# Patient Record
Sex: Female | Born: 1935 | Race: White | Hispanic: No | Marital: Married | Smoking: Never smoker
Health system: Southern US, Community
[De-identification: ages and names within clinical notes are randomized; demographics above are authoritative.]

## PROBLEM LIST (undated history)

## (undated) DIAGNOSIS — M545 Low back pain, unspecified: Secondary | ICD-10-CM

## (undated) DIAGNOSIS — K219 Gastro-esophageal reflux disease without esophagitis: Secondary | ICD-10-CM

## (undated) DIAGNOSIS — K5790 Diverticulosis of intestine, part unspecified, without perforation or abscess without bleeding: Secondary | ICD-10-CM

## (undated) DIAGNOSIS — E669 Obesity, unspecified: Secondary | ICD-10-CM

## (undated) DIAGNOSIS — N189 Chronic kidney disease, unspecified: Secondary | ICD-10-CM

## (undated) DIAGNOSIS — R0609 Other forms of dyspnea: Secondary | ICD-10-CM

## (undated) DIAGNOSIS — N63 Unspecified lump in unspecified breast: Secondary | ICD-10-CM

## (undated) DIAGNOSIS — I480 Paroxysmal atrial fibrillation: Secondary | ICD-10-CM

## (undated) DIAGNOSIS — E785 Hyperlipidemia, unspecified: Secondary | ICD-10-CM

## (undated) DIAGNOSIS — G8929 Other chronic pain: Secondary | ICD-10-CM

## (undated) DIAGNOSIS — G4730 Sleep apnea, unspecified: Secondary | ICD-10-CM

## (undated) DIAGNOSIS — I10 Essential (primary) hypertension: Secondary | ICD-10-CM

## (undated) DIAGNOSIS — R55 Syncope and collapse: Secondary | ICD-10-CM

## (undated) HISTORY — DX: Gastro-esophageal reflux disease without esophagitis: K21.9

## (undated) HISTORY — PX: ORTHOPEDIC SURGERY: SHX850

## (undated) HISTORY — DX: Chronic kidney disease, unspecified: N18.9

## (undated) HISTORY — DX: Hyperlipidemia, unspecified: E78.5

## (undated) HISTORY — DX: Low back pain: M54.5

## (undated) HISTORY — DX: Syncope and collapse: R55

## (undated) HISTORY — DX: Obesity, unspecified: E66.9

## (undated) HISTORY — DX: Paroxysmal atrial fibrillation: I48.0

## (undated) HISTORY — PX: CATARACT EXTRACTION: SUR2

## (undated) HISTORY — DX: Unspecified lump in unspecified breast: N63.0

## (undated) HISTORY — DX: Other forms of dyspnea: R06.09

## (undated) HISTORY — DX: Low back pain, unspecified: M54.50

## (undated) HISTORY — DX: Diverticulosis of intestine, part unspecified, without perforation or abscess without bleeding: K57.90

## (undated) HISTORY — PX: TUBAL LIGATION: SHX77

## (undated) HISTORY — DX: Other chronic pain: G89.29

## (undated) HISTORY — DX: Sleep apnea, unspecified: G47.30

## (undated) HISTORY — DX: Essential (primary) hypertension: I10

---

## 1984-07-16 HISTORY — PX: DILATION AND CURETTAGE OF UTERUS: SHX78

## 1990-07-16 HISTORY — PX: LUMBAR LAMINECTOMY: SHX95

## 1991-07-17 HISTORY — PX: CHOLECYSTECTOMY: SHX55

## 1997-12-23 ENCOUNTER — Inpatient Hospital Stay (HOSPITAL_COMMUNITY): Admission: EM | Admit: 1997-12-23 | Discharge: 1997-12-29 | Payer: Self-pay | Admitting: Emergency Medicine

## 1997-12-29 ENCOUNTER — Inpatient Hospital Stay
Admission: RE | Admit: 1997-12-29 | Discharge: 1998-01-17 | Payer: Self-pay | Admitting: Physical Medicine & Rehabilitation

## 1998-01-19 ENCOUNTER — Other Ambulatory Visit
Admission: RE | Admit: 1998-01-19 | Discharge: 1998-01-19 | Payer: Self-pay | Admitting: Physical Medicine & Rehabilitation

## 1998-01-27 ENCOUNTER — Other Ambulatory Visit
Admission: RE | Admit: 1998-01-27 | Discharge: 1998-01-27 | Payer: Self-pay | Admitting: Physical Medicine & Rehabilitation

## 1998-02-03 ENCOUNTER — Other Ambulatory Visit: Admission: RE | Admit: 1998-02-03 | Discharge: 1998-02-03 | Payer: Self-pay | Admitting: *Deleted

## 1998-02-08 ENCOUNTER — Other Ambulatory Visit: Admission: RE | Admit: 1998-02-08 | Discharge: 1998-02-08 | Payer: Self-pay | Admitting: *Deleted

## 1998-02-09 ENCOUNTER — Other Ambulatory Visit: Admission: RE | Admit: 1998-02-09 | Discharge: 1998-02-09 | Payer: Self-pay | Admitting: *Deleted

## 1998-02-10 ENCOUNTER — Other Ambulatory Visit: Admission: RE | Admit: 1998-02-10 | Discharge: 1998-02-10 | Payer: Self-pay | Admitting: *Deleted

## 1998-02-11 ENCOUNTER — Other Ambulatory Visit: Admission: RE | Admit: 1998-02-11 | Discharge: 1998-02-11 | Payer: Self-pay

## 2000-10-24 ENCOUNTER — Encounter: Payer: Self-pay | Admitting: Family Medicine

## 2000-10-24 ENCOUNTER — Ambulatory Visit (HOSPITAL_COMMUNITY): Admission: RE | Admit: 2000-10-24 | Discharge: 2000-10-24 | Payer: Self-pay | Admitting: Family Medicine

## 2001-08-11 ENCOUNTER — Ambulatory Visit (HOSPITAL_COMMUNITY): Admission: RE | Admit: 2001-08-11 | Discharge: 2001-08-11 | Payer: Self-pay | Admitting: Internal Medicine

## 2001-09-22 ENCOUNTER — Ambulatory Visit (HOSPITAL_COMMUNITY): Admission: RE | Admit: 2001-09-22 | Discharge: 2001-09-22 | Payer: Self-pay | Admitting: Internal Medicine

## 2001-12-18 ENCOUNTER — Encounter: Payer: Self-pay | Admitting: Family Medicine

## 2001-12-18 ENCOUNTER — Ambulatory Visit (HOSPITAL_COMMUNITY): Admission: RE | Admit: 2001-12-18 | Discharge: 2001-12-18 | Payer: Self-pay | Admitting: Family Medicine

## 2002-10-16 ENCOUNTER — Emergency Department (HOSPITAL_COMMUNITY): Admission: EM | Admit: 2002-10-16 | Discharge: 2002-10-17 | Payer: Self-pay | Admitting: Emergency Medicine

## 2002-10-16 ENCOUNTER — Encounter: Payer: Self-pay | Admitting: Emergency Medicine

## 2002-12-22 ENCOUNTER — Encounter: Payer: Self-pay | Admitting: Family Medicine

## 2002-12-22 ENCOUNTER — Ambulatory Visit (HOSPITAL_COMMUNITY): Admission: RE | Admit: 2002-12-22 | Discharge: 2002-12-22 | Payer: Self-pay | Admitting: Family Medicine

## 2003-03-30 ENCOUNTER — Ambulatory Visit (HOSPITAL_COMMUNITY): Admission: RE | Admit: 2003-03-30 | Discharge: 2003-03-30 | Payer: Self-pay | Admitting: Family Medicine

## 2003-03-30 ENCOUNTER — Encounter: Payer: Self-pay | Admitting: Family Medicine

## 2003-10-06 ENCOUNTER — Ambulatory Visit (HOSPITAL_COMMUNITY): Admission: RE | Admit: 2003-10-06 | Discharge: 2003-10-06 | Payer: Self-pay | Admitting: Cardiology

## 2003-12-24 ENCOUNTER — Ambulatory Visit (HOSPITAL_COMMUNITY): Admission: RE | Admit: 2003-12-24 | Discharge: 2003-12-24 | Payer: Self-pay | Admitting: Family Medicine

## 2004-12-25 ENCOUNTER — Ambulatory Visit (HOSPITAL_COMMUNITY): Admission: RE | Admit: 2004-12-25 | Discharge: 2004-12-25 | Payer: Self-pay | Admitting: Family Medicine

## 2005-04-23 ENCOUNTER — Ambulatory Visit: Payer: Self-pay | Admitting: Cardiology

## 2005-09-18 ENCOUNTER — Ambulatory Visit (HOSPITAL_COMMUNITY): Admission: RE | Admit: 2005-09-18 | Discharge: 2005-09-18 | Payer: Self-pay | Admitting: Family Medicine

## 2005-11-19 ENCOUNTER — Ambulatory Visit: Payer: Self-pay | Admitting: Internal Medicine

## 2005-12-04 ENCOUNTER — Ambulatory Visit: Payer: Self-pay | Admitting: Internal Medicine

## 2005-12-04 ENCOUNTER — Ambulatory Visit (HOSPITAL_COMMUNITY): Admission: RE | Admit: 2005-12-04 | Discharge: 2005-12-04 | Payer: Self-pay | Admitting: Internal Medicine

## 2005-12-04 ENCOUNTER — Encounter (INDEPENDENT_AMBULATORY_CARE_PROVIDER_SITE_OTHER): Payer: Self-pay | Admitting: *Deleted

## 2006-02-19 ENCOUNTER — Ambulatory Visit (HOSPITAL_COMMUNITY): Admission: RE | Admit: 2006-02-19 | Discharge: 2006-02-19 | Payer: Self-pay | Admitting: Family Medicine

## 2006-03-07 ENCOUNTER — Encounter: Admission: RE | Admit: 2006-03-07 | Discharge: 2006-03-07 | Payer: Self-pay | Admitting: Family Medicine

## 2006-04-24 ENCOUNTER — Ambulatory Visit: Payer: Self-pay | Admitting: Cardiology

## 2006-05-13 ENCOUNTER — Ambulatory Visit: Payer: Self-pay | Admitting: Cardiology

## 2006-05-20 ENCOUNTER — Encounter: Admission: RE | Admit: 2006-05-20 | Discharge: 2006-05-20 | Payer: Self-pay | Admitting: Family Medicine

## 2006-07-16 HISTORY — PX: COLONOSCOPY: SHX174

## 2006-12-03 ENCOUNTER — Ambulatory Visit: Payer: Self-pay | Admitting: Cardiology

## 2007-02-20 ENCOUNTER — Ambulatory Visit (HOSPITAL_COMMUNITY): Admission: RE | Admit: 2007-02-20 | Discharge: 2007-02-20 | Payer: Self-pay | Admitting: Family Medicine

## 2007-03-21 ENCOUNTER — Emergency Department (HOSPITAL_COMMUNITY): Admission: EM | Admit: 2007-03-21 | Discharge: 2007-03-22 | Payer: Self-pay | Admitting: Emergency Medicine

## 2007-03-23 ENCOUNTER — Ambulatory Visit: Payer: Self-pay | Admitting: Cardiology

## 2007-03-23 ENCOUNTER — Inpatient Hospital Stay (HOSPITAL_COMMUNITY): Admission: EM | Admit: 2007-03-23 | Discharge: 2007-04-01 | Payer: Self-pay | Admitting: Emergency Medicine

## 2007-03-24 ENCOUNTER — Ambulatory Visit: Payer: Self-pay | Admitting: Internal Medicine

## 2007-03-25 ENCOUNTER — Ambulatory Visit: Payer: Self-pay | Admitting: Internal Medicine

## 2007-04-11 ENCOUNTER — Inpatient Hospital Stay (HOSPITAL_COMMUNITY): Admission: EM | Admit: 2007-04-11 | Discharge: 2007-04-13 | Payer: Self-pay | Admitting: Emergency Medicine

## 2007-04-15 ENCOUNTER — Ambulatory Visit: Payer: Self-pay | Admitting: Cardiology

## 2007-04-16 ENCOUNTER — Ambulatory Visit: Payer: Self-pay | Admitting: Cardiology

## 2007-04-16 ENCOUNTER — Encounter (HOSPITAL_COMMUNITY): Admission: RE | Admit: 2007-04-16 | Discharge: 2007-05-16 | Payer: Self-pay | Admitting: Cardiology

## 2007-05-01 ENCOUNTER — Ambulatory Visit (HOSPITAL_COMMUNITY): Admission: RE | Admit: 2007-05-01 | Discharge: 2007-05-01 | Payer: Self-pay | Admitting: Surgery

## 2008-02-25 ENCOUNTER — Ambulatory Visit (HOSPITAL_COMMUNITY): Admission: RE | Admit: 2008-02-25 | Discharge: 2008-02-25 | Payer: Self-pay | Admitting: Internal Medicine

## 2008-07-16 DIAGNOSIS — R0600 Dyspnea, unspecified: Secondary | ICD-10-CM

## 2008-07-16 DIAGNOSIS — R0609 Other forms of dyspnea: Secondary | ICD-10-CM

## 2008-07-16 DIAGNOSIS — G4730 Sleep apnea, unspecified: Secondary | ICD-10-CM

## 2008-07-16 HISTORY — DX: Sleep apnea, unspecified: G47.30

## 2008-07-16 HISTORY — DX: Dyspnea, unspecified: R06.00

## 2008-07-16 HISTORY — DX: Other forms of dyspnea: R06.09

## 2008-07-19 ENCOUNTER — Ambulatory Visit (HOSPITAL_COMMUNITY): Admission: RE | Admit: 2008-07-19 | Discharge: 2008-07-19 | Payer: Self-pay | Admitting: Internal Medicine

## 2008-07-27 ENCOUNTER — Inpatient Hospital Stay (HOSPITAL_COMMUNITY): Admission: EM | Admit: 2008-07-27 | Discharge: 2008-07-29 | Payer: Self-pay | Admitting: Emergency Medicine

## 2008-07-30 ENCOUNTER — Ambulatory Visit: Payer: Self-pay | Admitting: Cardiology

## 2008-08-30 ENCOUNTER — Encounter (INDEPENDENT_AMBULATORY_CARE_PROVIDER_SITE_OTHER): Payer: Self-pay | Admitting: *Deleted

## 2008-08-30 LAB — CONVERTED CEMR LAB
Albumin: 4.1 g/dL
BUN: 36 mg/dL
CO2: 27 meq/L
Calcium: 9.3 mg/dL
Glomerular Filtration Rate, Af Am: 48 mL/min/{1.73_m2}
Glucose, Bld: 89 mg/dL
Potassium: 4.5 meq/L
Total Protein: 6.7 g/dL
Triglycerides: 145 mg/dL

## 2008-09-02 ENCOUNTER — Ambulatory Visit (HOSPITAL_COMMUNITY): Admission: RE | Admit: 2008-09-02 | Discharge: 2008-09-02 | Payer: Self-pay | Admitting: Internal Medicine

## 2008-09-02 ENCOUNTER — Encounter (INDEPENDENT_AMBULATORY_CARE_PROVIDER_SITE_OTHER): Payer: Self-pay | Admitting: Internal Medicine

## 2008-09-13 ENCOUNTER — Ambulatory Visit: Payer: Self-pay | Admitting: Cardiology

## 2008-09-16 ENCOUNTER — Ambulatory Visit: Admission: RE | Admit: 2008-09-16 | Discharge: 2008-09-16 | Payer: Self-pay | Admitting: Cardiology

## 2008-09-27 ENCOUNTER — Ambulatory Visit: Payer: Self-pay | Admitting: Cardiology

## 2008-12-16 ENCOUNTER — Encounter: Admission: RE | Admit: 2008-12-16 | Discharge: 2008-12-16 | Payer: Self-pay | Admitting: Orthopedic Surgery

## 2009-01-09 IMAGING — CT CT ABDOMEN W/ CM
1 of 2 series · 14 of 32 positions shown, 19 images · IV contrast (Omnipaque 300)
Comparison: none

HISTORY: Left lower quadrant pain, history diverticulitis

[Series 2: abd_pel 5.0 b40f · axial · 0.98mm/px · z∈[-456,-30]mm · 14 of 97 slices shown, 19 images]
[im 6/97  soft-tissue]
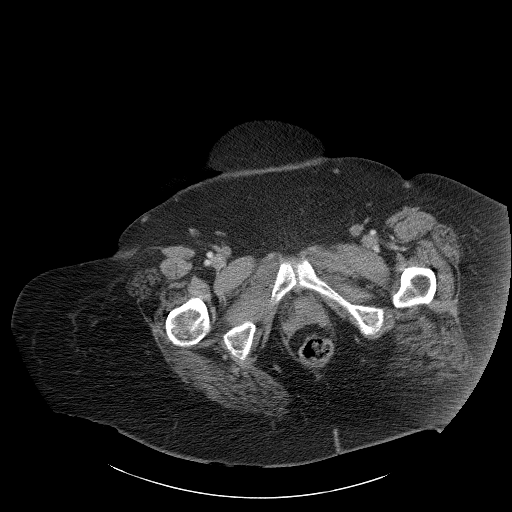
[im 6/97  bone]
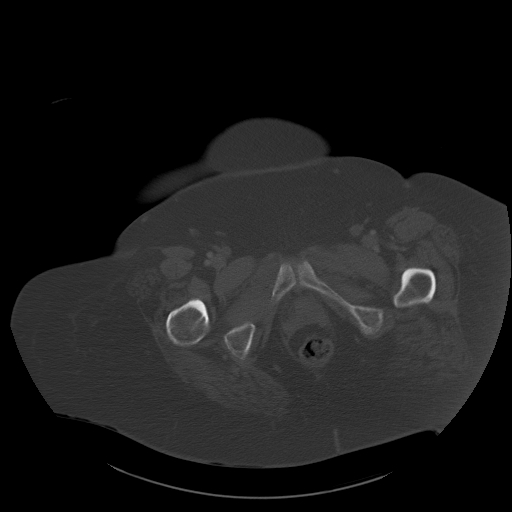
[im 16/97  soft-tissue]
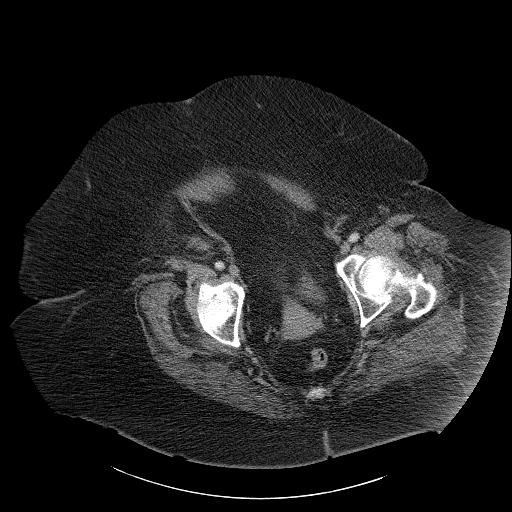
[im 21/97  soft-tissue]
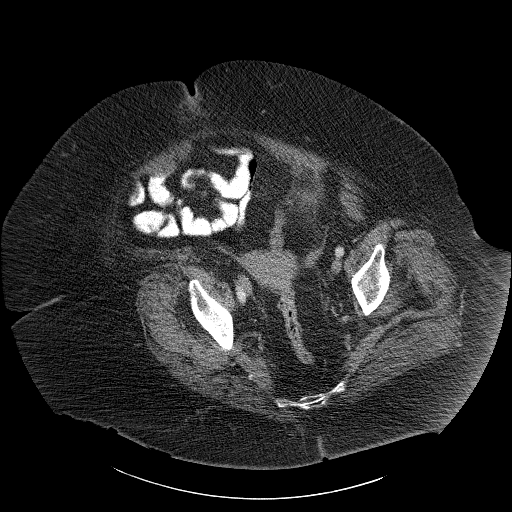
[im 26/97  soft-tissue]
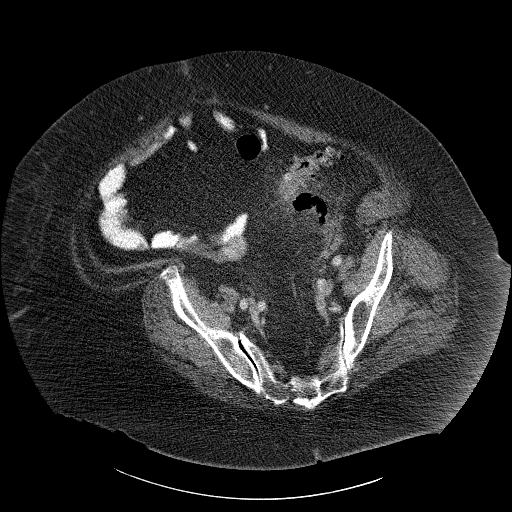
[im 36/97  soft-tissue]
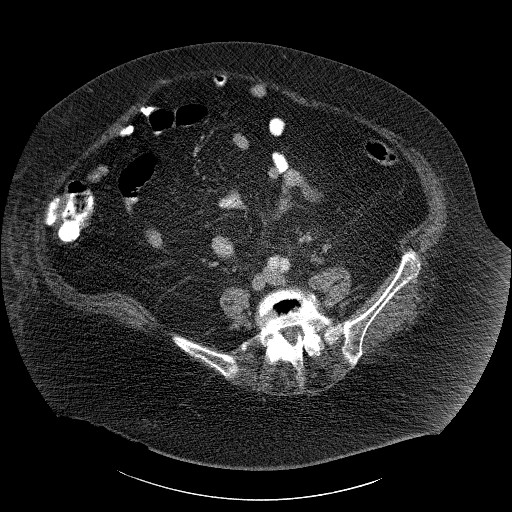
[im 41/97  soft-tissue]
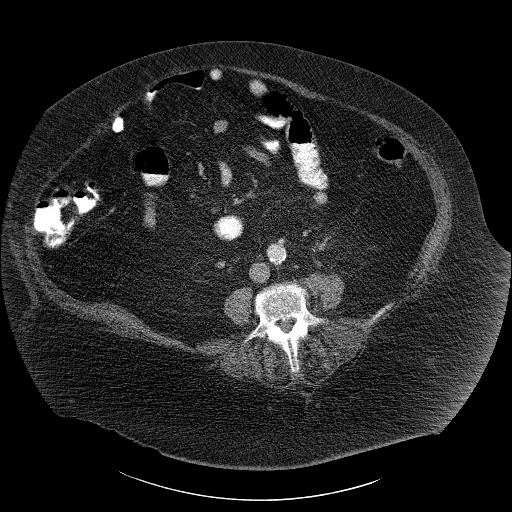
[im 51/97  soft-tissue]
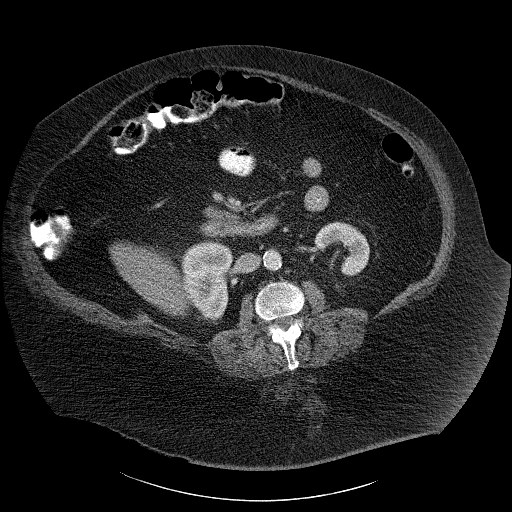
[im 56/97  soft-tissue]
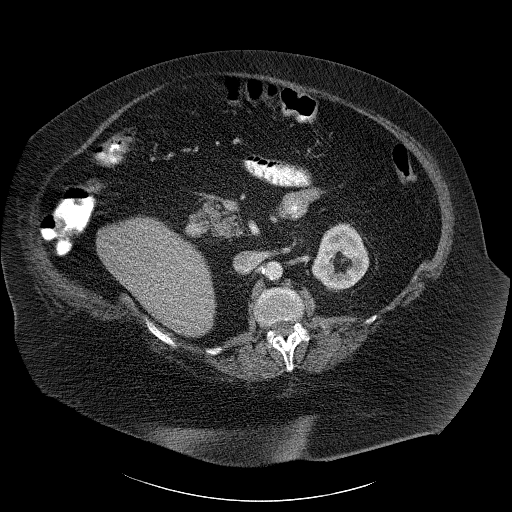
[im 61/97  soft-tissue]
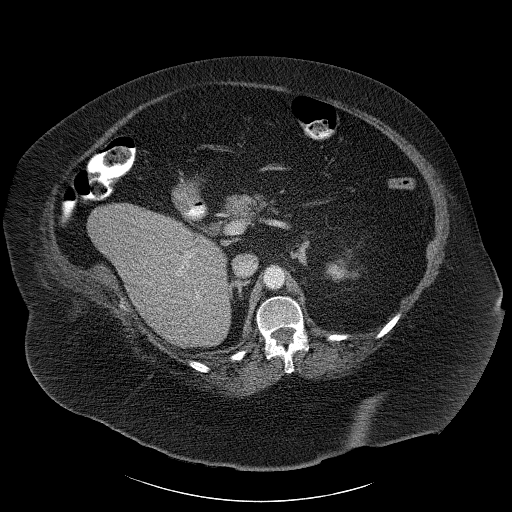
[im 61/97  bone]
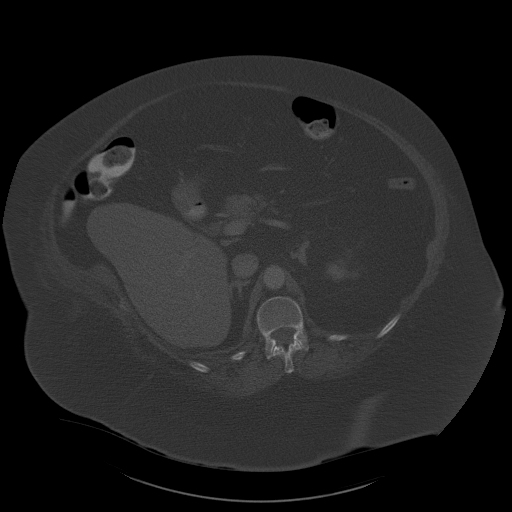
[im 71/97  soft-tissue]
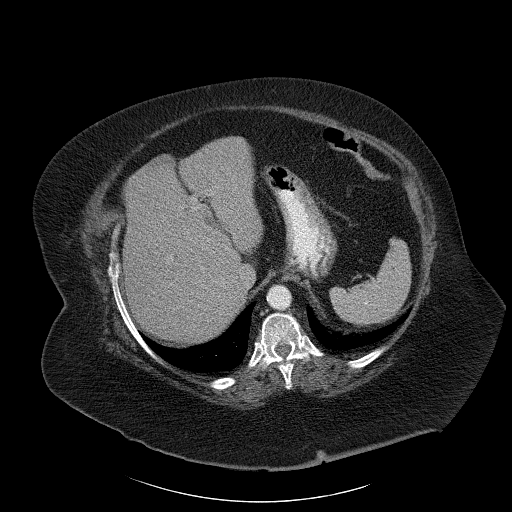
[im 76/97  soft-tissue]
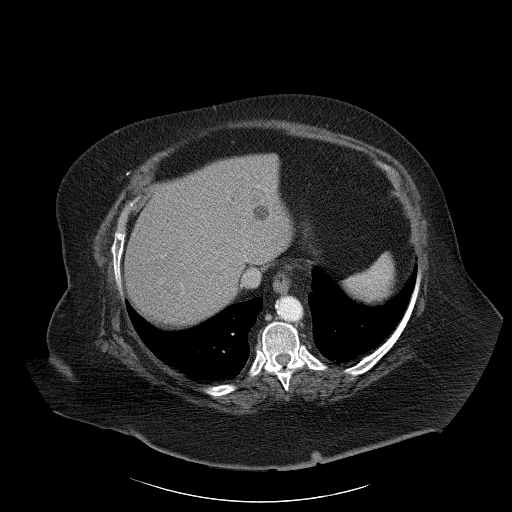
[im 76/97  lung]
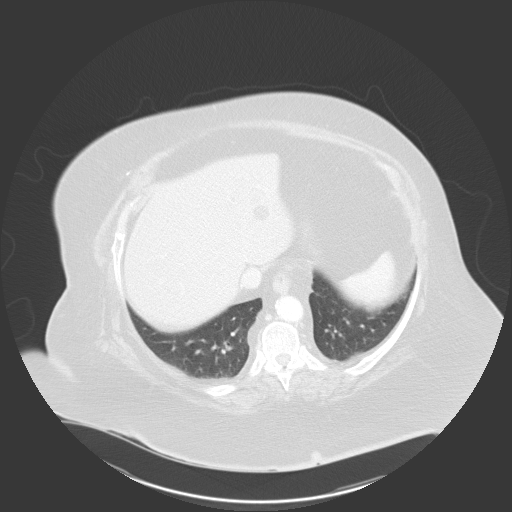
[im 81/97  soft-tissue]
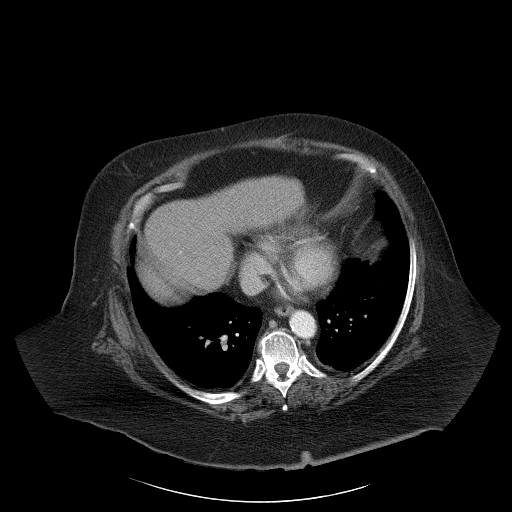
[im 81/97  lung]
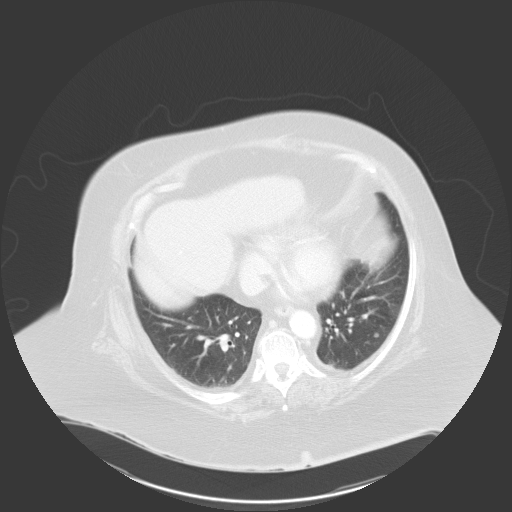
[im 86/97  lung]
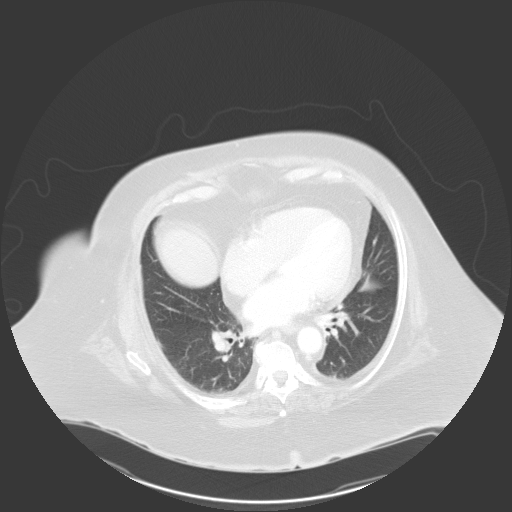
[im 91/97  soft-tissue]
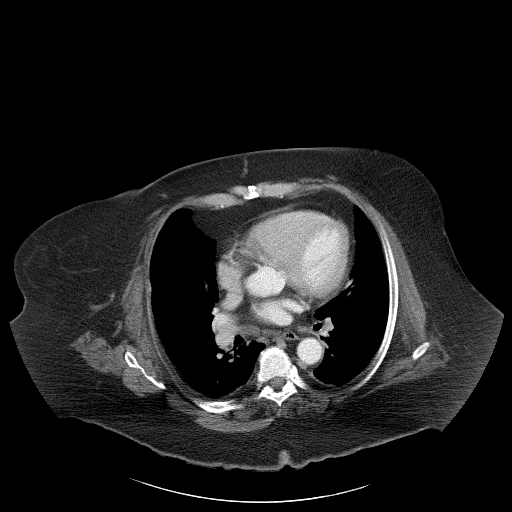
[im 91/97  lung]
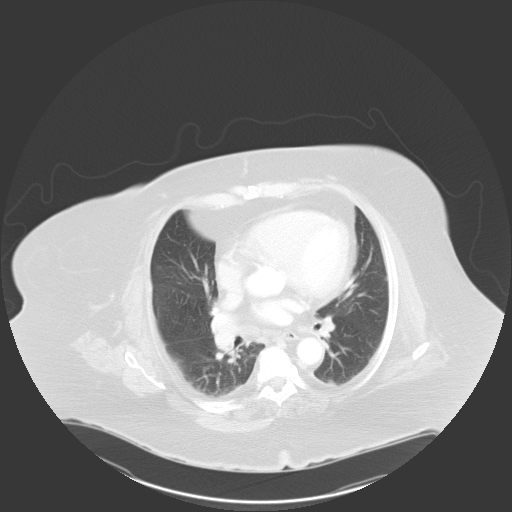

[14 of 32 positions shown; findings below may reference images not displayed]

CT ABDOMEN AND PELVIS WITH CONTRAST:

Multidetector helical CT imaging abdomen and pelvis performed.
Sagittal and coronal images are reconstructed from the axial data set.
Exam utilized dilute oral contrast and 100 cc Vmnipaque-011.
Comparison 09/18/2005

CT ABDOMEN:

Minimal atelectasis at right lung base.
Bilobed cyst at lateral segment left lobe liver, 2.7 x 1.8 cm image 23
unchanged.
Minimal fatty infiltration of liver.
Status post cholecystectomy.
Additional tiny anterior left lobe hepatic cyst 12 mm greatest size image 19.
Remainder of liver, spleen, pancreas, kidneys, and adrenal glands normal.
Status post cholecystectomy.
Atherosclerotic calcifications aorta.
No upper abdominal mass, adenopathy, or free fluid.
IMPRESSION: Hepatic cyst disease with mild fatty infiltration of liver.
No acute upper abdominal abnormalities, see below.

CT PELVIS:

Sigmoid diverticula noted with mild sigmoid wall thickening, hazy infiltration
of sigmoid mesocolon, and presence of extraluminal gas compatible with
diverticulitis and contained perforation.
Gas collection measures approximately 4.6 x 2.2 x 3.1 cm.
No evidence of free intraperitoneal air or ascites.
Uterus, ovaries, appendix, and remaining pelvic bowel loops normal.
No mass, adenopathy, or hernia.
IMPRESSION: Sigmoid diverticulitis with contained perforation at sigmoid mesocolon, air
cavity in sigmoid mesocolon measuring 4.6 cm in greatest size.
No other pelvic abnormalities.
Findings called to Dr. [REDACTED] at 9646 hours on 03/24/2007.

## 2009-01-13 IMAGING — CR DG CHEST 2V
2 series · 2 of 2 positions shown · non-contrast
Comparison: None.

CLINICAL DATA: Diverticulitis. Dyspnea

[view not recorded (1 of 2)]
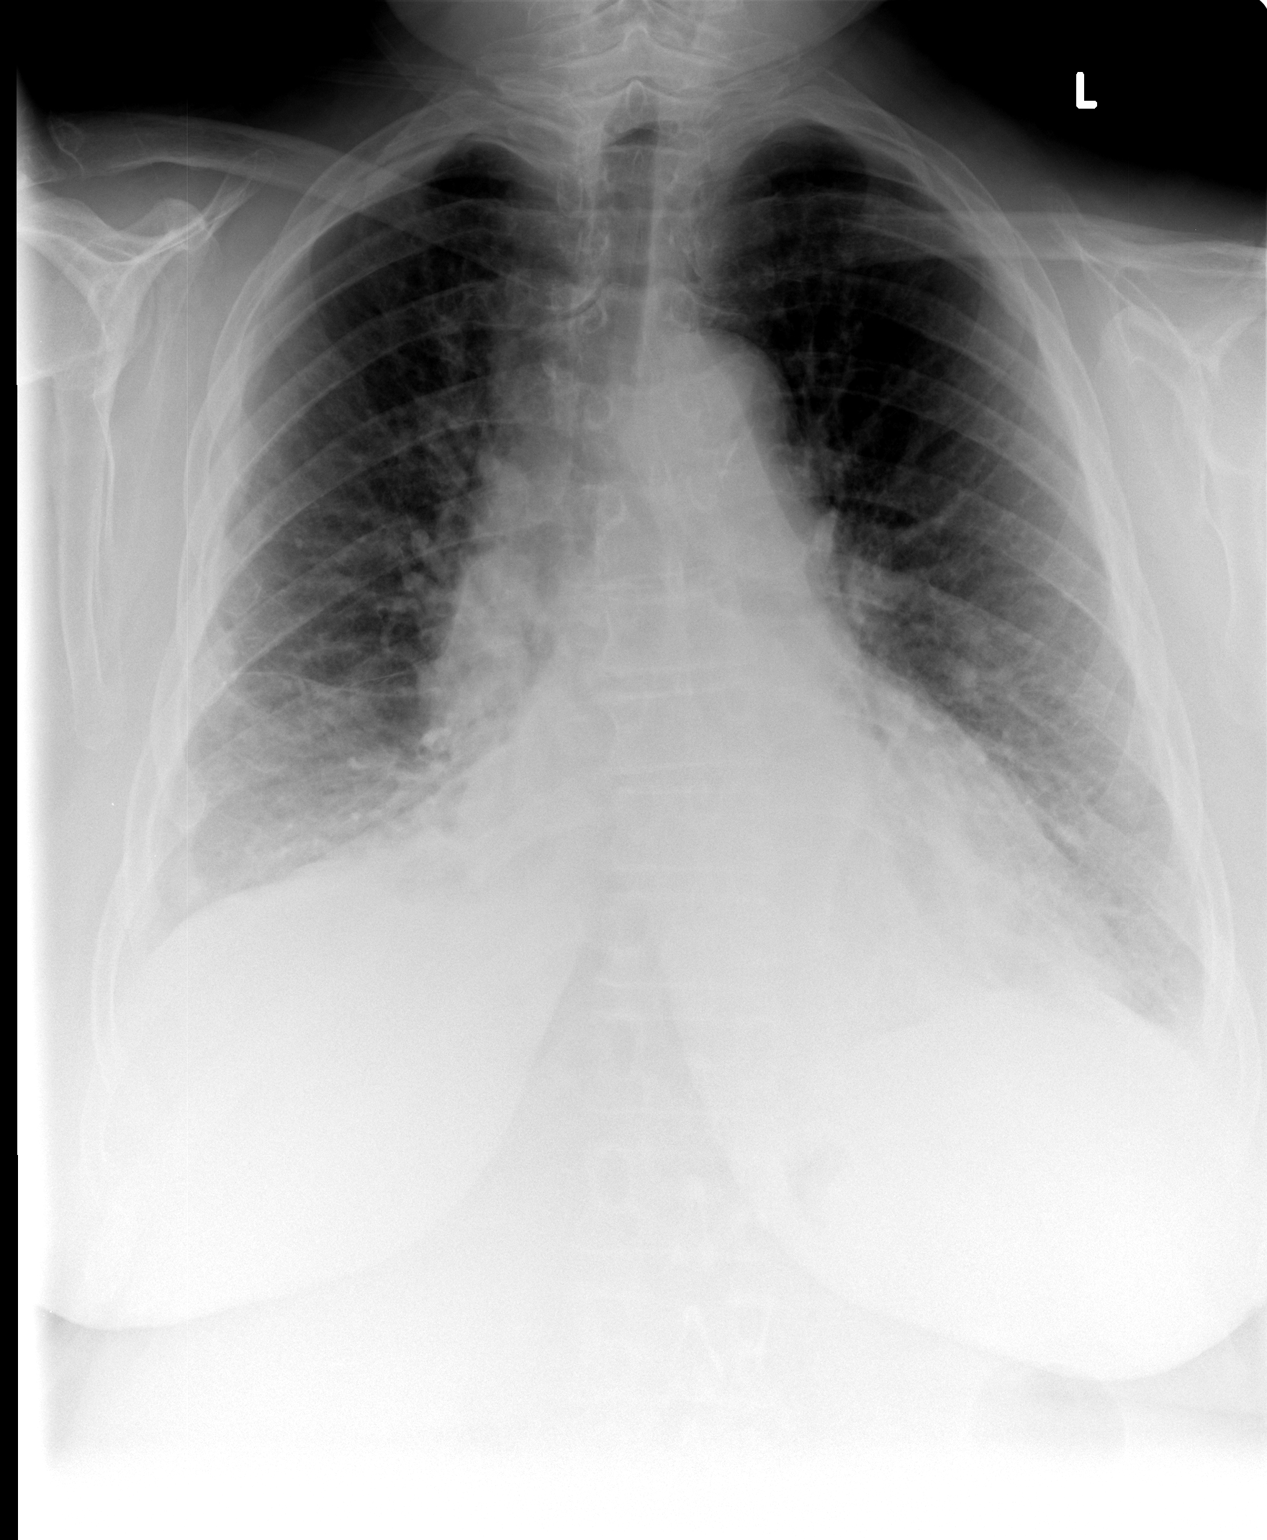

[view not recorded (2 of 2)]
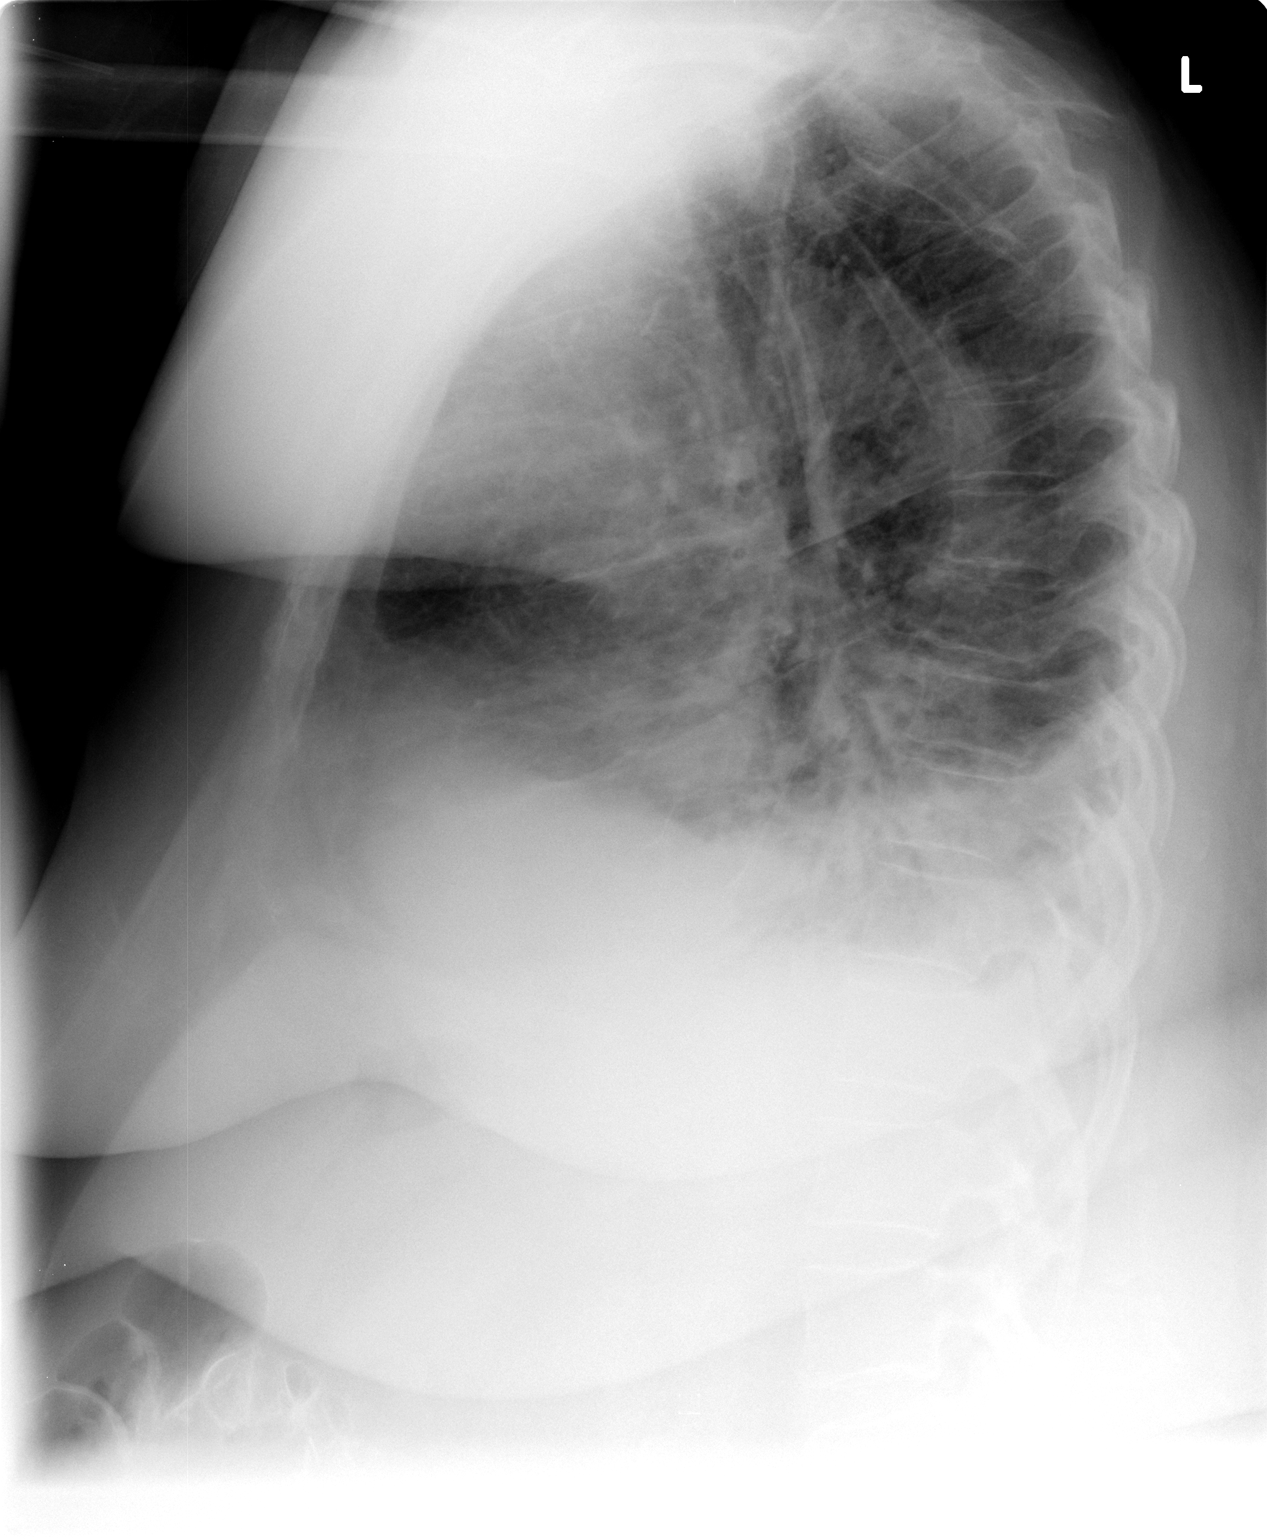

[2 of 2 positions shown; findings below may reference images not displayed]

CHEST - 2 VIEW:

Low volume film. Cardiopericardial silhouette is enlarged. There is pulmonary
vascular congestion with probable interstitial edema at the bases. Superimposed
bibasilar atelectasis or infiltrate is evident. There are small bilateral
pleural effusions.
IMPRESSION: Cardiomegaly with vascular congestion and interstitial pulmonary edema.

Bibasilar atelectasis/infiltrate with small bilateral pleural effusions.

## 2009-02-25 ENCOUNTER — Ambulatory Visit (HOSPITAL_COMMUNITY): Admission: RE | Admit: 2009-02-25 | Discharge: 2009-02-25 | Payer: Self-pay | Admitting: Internal Medicine

## 2009-06-29 ENCOUNTER — Encounter (INDEPENDENT_AMBULATORY_CARE_PROVIDER_SITE_OTHER): Payer: Self-pay | Admitting: *Deleted

## 2009-06-29 LAB — CONVERTED CEMR LAB
Albumin: 4.1 g/dL
BUN: 36 mg/dL
CO2: 27 meq/L
Calcium: 9.3 mg/dL
Cholesterol: 220 mg/dL
Creatinine, Ser: 1.11 mg/dL
GFR calc non Af Amer: 48 mL/min
Glucose, Bld: 89 mg/dL
Hgb A1c MFr Bld: 6.1 %
LDL Cholesterol: 131 mg/dL

## 2009-08-31 DIAGNOSIS — E663 Overweight: Secondary | ICD-10-CM | POA: Insufficient documentation

## 2009-09-02 ENCOUNTER — Encounter (INDEPENDENT_AMBULATORY_CARE_PROVIDER_SITE_OTHER): Payer: Self-pay | Admitting: *Deleted

## 2009-09-13 ENCOUNTER — Encounter (INDEPENDENT_AMBULATORY_CARE_PROVIDER_SITE_OTHER): Payer: Self-pay | Admitting: *Deleted

## 2009-09-15 ENCOUNTER — Ambulatory Visit: Payer: Self-pay | Admitting: Cardiology

## 2009-09-15 ENCOUNTER — Encounter (INDEPENDENT_AMBULATORY_CARE_PROVIDER_SITE_OTHER): Payer: Self-pay | Admitting: *Deleted

## 2009-09-19 ENCOUNTER — Encounter: Payer: Self-pay | Admitting: Adult Health

## 2009-09-19 LAB — CONVERTED CEMR LAB
Albumin: 4.2 g/dL (ref 3.5–5.2)
BUN: 31 mg/dL — ABNORMAL HIGH (ref 6–23)
CO2: 27 meq/L (ref 19–32)
Calcium: 9.8 mg/dL (ref 8.4–10.5)
Cholesterol: 205 mg/dL — ABNORMAL HIGH (ref 0–200)
Glucose, Bld: 89 mg/dL (ref 70–99)
HDL: 61 mg/dL (ref >39)
Total CHOL/HDL Ratio: 3.4

## 2009-09-21 ENCOUNTER — Encounter (INDEPENDENT_AMBULATORY_CARE_PROVIDER_SITE_OTHER): Payer: Self-pay | Admitting: *Deleted

## 2009-10-15 ENCOUNTER — Encounter: Admission: RE | Admit: 2009-10-15 | Discharge: 2009-10-15 | Payer: Self-pay | Admitting: Orthopedic Surgery

## 2010-01-20 ENCOUNTER — Emergency Department (HOSPITAL_COMMUNITY): Admission: EM | Admit: 2010-01-20 | Discharge: 2010-01-20 | Payer: Self-pay | Admitting: Emergency Medicine

## 2010-02-14 ENCOUNTER — Inpatient Hospital Stay (HOSPITAL_COMMUNITY): Admission: EM | Admit: 2010-02-14 | Discharge: 2010-02-19 | Payer: Self-pay | Admitting: Emergency Medicine

## 2010-03-30 ENCOUNTER — Ambulatory Visit (HOSPITAL_COMMUNITY): Admission: RE | Admit: 2010-03-30 | Discharge: 2010-03-30 | Payer: Self-pay | Admitting: Internal Medicine

## 2010-06-22 ENCOUNTER — Encounter (INDEPENDENT_AMBULATORY_CARE_PROVIDER_SITE_OTHER): Payer: Self-pay | Admitting: *Deleted

## 2010-08-15 NOTE — Miscellaneous (Signed)
Summary: LABS CMP,LIPIDS,A1C 06/29/2009  Clinical Lists Changes  Observations: Added new observation of CALCIUM: 9.3 mg/dL (10/93/2355 73:22) Added new observation of ALBUMIN: 4.1 g/dL (02/54/2706 23:76) Added new observation of PROTEIN, TOT: 6.7 g/dL (28/31/5176 16:07) Added new observation of SGPT (ALT): 21 units/L (06/29/2009 15:22) Added new observation of SGOT (AST): 18 units/L (06/29/2009 15:22) Added new observation of ALK PHOS: 36 units/L (06/29/2009 15:22) Added new observation of GFR AA: 58 mL/min/1.36m2 (06/29/2009 15:22) Added new observation of GFR: 48 mL/min (06/29/2009 15:22) Added new observation of CREATININE: 1.11 mg/dL (37/04/6268 48:54) Added new observation of BUN: 36 mg/dL (62/70/3500 93:81) Added new observation of BG RANDOM: 89 mg/dL (82/99/3716 96:78) Added new observation of CO2 PLSM/SER: 27 meq/L (06/29/2009 15:22) Added new observation of CL SERUM: 103 meq/L (06/29/2009 15:22) Added new observation of K SERUM: 4.5 meq/L (06/29/2009 15:22) Added new observation of NA: 143 meq/L (06/29/2009 15:22) Added new observation of LDL: 131 mg/dL (93/81/0175 10:25) Added new observation of HDL: 60 mg/dL (85/27/7824 23:53) Added new observation of TRIGLYC TOT: 145 mg/dL (61/44/3154 00:86) Added new observation of CHOLESTEROL: 220 mg/dL (76/19/5093 26:71) Added new observation of HGBA1C: 6.1 % (06/29/2009 15:22)

## 2010-08-15 NOTE — Letter (Signed)
Summary: [facility] Work [facility], agricultural at [facility]  [address] [place] [address]   Phone: [phone]  Fax: [phone]     September 15, 2009 MRN: [id number]   Brayla Plasse [address] [place], [place]  [address]      YOUR LAB WORK IS DUE   ____________________MONDAY MARCH 7, 2011_____________________  Please go to [facility], located across the street from [facility] on the second floor.  Hours are Monday - Friday 7am until 7:30pm         Saturday 8am until 12noon    _X_  DO NOT EAT OR DRINK AFTER MIDNIGHT EVENING PRIOR TO LABWORK  __ YOUR LABWORK IS NOT FASTING --YOU MAY EAT PRIOR TO LABWORK

## 2010-08-15 NOTE — Letter (Signed)
Summary: Appointment - Reminder 2  [facility] [place]. [address], [place] [address]   Phone: [phone]  Fax: [phone]     June 22, 2010 MRN: [id number]   Stefanie [name] [address] [place], [place]  [address]   Dear Ms. Kulpa,  Our records indicate that it is time to schedule a follow-up appointment.  Dr.   Dietrich Pates       recommended that you follow up with [place] in   03/2010 PAST DUE        . It is very important that we reach you to schedule this appointment. We look forward to participating in your health care needs. Please contact us at the number listed above at your earliest convenience to schedule your appointment.  If you are unable to make an appointment at this time, give [place] a call so we can update our records.     Sincerely,   Glass blower/designer

## 2010-08-15 NOTE — Assessment & Plan Note (Signed)
Summary: past due for 6 mth f/u per  pt phone call/tg  Medications Added POTASSIUM CHLORIDE 20 MEQ/15ML (10%) LIQD (POTASSIUM CHLORIDE) take 1 tab daily ACTOS 15 MG TABS (PIOGLITAZONE HCL) take 1 tab daily BUMETANIDE 1 MG TABS (BUMETANIDE) take 1 tab daily PRILOSEC 20 MG CPDR (OMEPRAZOLE) take 1 tab daily POLYCOSE  POWD (GLUCOSE POLYMER) use 1 time daily TRILIPIX 135 MG CPDR (CHOLINE FENOFIBRATE) take 1 tab daily CALCIUM-VITAMIN D 250-125 MG-UNIT TABS (CALCIUM CARBONATE-VITAMIN D) take 1 tab two times a day ASPIR-LOW 81 MG TBEC (ASPIRIN) take 1 tab daily B COMPLEX-B12  TABS (B COMPLEX VITAMINS) take 1 tab daily VITAMIN D 1000 UNIT TABS (CHOLECALCIFEROL) take 1 tab daily DAILY MULTI  TABS (MULTIPLE VITAMINS-MINERALS) take 1 tab daily      Allergies Added: NKDA  Visit Type:  Follow-up Primary Provider:  zach hall  CC:  no complaints today.  History of Present Illness: Stefanie [name] is a very pleasant 77 y/o morbidly obese CF we are following for history of palpatations, mixed hyperlipidemia, with diagnoisis of OSA per sleep study ordered by Dr.Rothbart 1 year ago.  Now placed on CPAP per Dr. Margo Aye.  She is here today for follow-up and medication refills.  She  is without complaint of myalgias related to use of medications.  She had been having DOE, chest discomfort and palpatations, prior to use of CPAP.  She states since she has started use of this she is asyptomatic.  She has chronic back pain, which limits her ambulation and she uses a walker for support.  Otherwise she is feeling well.  Current Medications (verified): 1)  Welchol 625 Mg Tabs (Colesevelam Hcl) .... Take 2 Tablets By Mouth Three Times A Day 2)  Metoprolol Tartrate 25 Mg Tabs (Metoprolol Tartrate) .... Take 1 Tablet By Mouth Two Times A Day 3)  Potassium Chloride 20 Meq/77ml (10%) Liqd (Potassium Chloride) .... Take 1 Tab Daily 4)  Actos 15 Mg Tabs (Pioglitazone Hcl) .... Take 1 Tab Daily 5)  Bumetanide 1 Mg Tabs  (Bumetanide) .... Take 1 Tab Daily 6)  Prilosec 20 Mg Cpdr (Omeprazole) .... Take 1 Tab Daily 7)  Polycose  Powd (Glucose Polymer) .... Use 1 Time Daily 8)  Trilipix 135 Mg Cpdr (Choline Fenofibrate) .... Take 1 Tab Daily 9)  Calcium-Vitamin D 250-125 Mg-Unit Tabs (Calcium Carbonate-Vitamin D) .... Take 1 Tab Two Times A Day 10)  Aspir-Low 81 Mg Tbec (Aspirin) .... Take 1 Tab Daily 11)  B Complex-B12  Tabs (B Complex Vitamins) .... Take 1 Tab Daily 12)  Vitamin D 1000 Unit Tabs (Cholecalciferol) .... Take 1 Tab Daily 13)  Daily Multi  Tabs (Multiple Vitamins-Minerals) .... Take 1 Tab Daily  Allergies (verified): No Known Drug Allergies  Past History:  Past medical, surgical, family and social histories (including risk factors) reviewed, and no changes noted (except as noted below).  Past Medical History: Reviewed history from 08/31/2009 and no changes required. Current Problems:  LUMP OR MASS IN BREAST (ICD-611.72) URINARY INCONTINENCE (ICD-788.30) SLEEP APNEA (ICD-780.57) BACK PAIN (ICD-724.5) OVERWEIGHT (ICD-278.02) OSTEOPOROSIS (ICD-733.00) DIVERTICULAR DISEASE (ICD-562.10) GERD (ICD-530.81) ASTHMA (ICD-493.90) PAROXYSMAL ATRIAL FIBRILLATION (ICD-427.31) HYPERTENSION (ICD-401.9) HYPERLIPIDEMIA (ICD-272.4)  Past Surgical History: Reviewed history from 08/31/2009 and no changes required. lumbar laminectomy 1992 cholecyst 1993 d+c 1986 catarect extractio gallbladder removed 1993 tubeligation  Family History: Reviewed history from 08/31/2009 and no changes required. Father:deceased age 66 harding of the arterys ,dm,asthma,chf Mother:deceased age 60 due to myocardial infarction Siblings:2 brothers living 1 deceased due to pancreatic  cancer  1 deceased due to chf.2 sisters deceased 1 with complications of colitis age 10 1 deceased age 16 dm and TIA.  Social History: Reviewed history from 08/31/2009 and no changes required. Married  Tobacco Use - No.  Alcohol Use -  no Regular Exercise - yes Drug Use - no  Review of Systems       Chronic back pain.  All other systems have been reviewed and are negative unless stated above.   Vital Signs:  Patient profile:   77 year old female Height:      62 inches Weight:      272 pounds BMI:     49.93 Pulse rate:   76 / minute BP sitting:   143 / 77  (right arm)  Vitals Entered By: Dreama Saa, CNA (September 15, 2009 1:05 PM)  Physical Exam  General:  Well developed, well nourished, in no acute distress. Eyes:  Glasses. Neck:  Neck supple, no JVD. No masses, thyromegaly or abnormal cervical nodes. Lungs:  Clear bilaterally to auscultation and percussion. Heart:  Non-displaced PMI, chest non-tender; regular rate and rhythm, S1, S2 without murmurs, rubs or gallops. Carotid upstroke normal, no bruit. Normal abdominal aortic size, no bruits. Femorals normal pulses, no bruits. Pedals normal pulses. No edema, no varicosities. Abdomen:  Obese, NT 2+ Bowel Sounds Msk:  Kyphosis noted, doweger hump. Extremities:  No edema, she wears support hose. Neurologic:  Alert and oriented x 3. Psych:  Normal affect.   Impression & Recommendations:  Problem # 1:  HYPERTENSION (ICD-401.9) Assessment Unchanged  Her updated medication list for this problem includes:    Metoprolol Tartrate 25 Mg Tabs (Metoprolol tartrate) .Marland Kitchen... Take 1 tablet by mouth two times a day    Bumetanide 1 Mg Tabs (Bumetanide) .Marland Kitchen... Take 1 tab daily    Aspir-low 81 Mg Tbec (Aspirin) .Marland Kitchen... Take 1 tab daily  Future Orders: T-Lipid Profile ([id number]) ... 09/19/2009 T-Renal Function Panel [id number]) ... 09/19/2009  Problem # 2:  SLEEP APNEA (ICD-780.57) Assessment: Improved  Problem # 3:  OVERWEIGHT (ICD-278.02) Discussion about weight loss and trying to increase activity. She is limited by chronic back pain.  Problem # 4:  HYPERLIPIDEMIA (ICD-272.4) Refill her medications and will order fasting lipids and LFT's to be sent to Dr. Margo Aye  for review. Her updated medication list for this problem includes:    Welchol 625 Mg Tabs (Colesevelam hcl) .Marland Kitchen... Take 2 tablets by mouth three times a day    Trilipix 135 Mg Cpdr (Choline fenofibrate) .Marland Kitchen... Take 1 tab daily  Future Orders: T-Lipid Profile ([id number]) ... 09/19/2009 T-Renal Function Panel [id number]) ... 09/19/2009  Patient Instructions: 1)  Your physician recommends that you schedule a follow-up appointment in: 6 months 2)  Your physician recommends that you return for lab work in: next week Prescriptions: METOPROLOL TARTRATE 25 MG TABS (METOPROLOL TARTRATE) take 1 tablet by mouth two times a day  #60 x 6   Entered by:   Teressa Lower RN   Authorized by:   Joni Reining, NP   Signed by:   Teressa Lower RN on 09/15/2009   Method used:   Electronically to        [facility]* (retail)       [address]       [place], [place]  [address]       Ph: [phone]       Fax: [phone]   RxID:  [id number] WELCHOL 625 MG TABS (COLESEVELAM HCL) Take 2 tablets by mouth three times a day  #180 x 6   Entered by:   Teressa Lower RN   Authorized by:   Joni Reining, NP   Signed by:   Teressa Lower RN on 09/15/2009   Method used:   Electronically to        [facility]* (retail)       [address]       [place], [place]  [address]       Ph: [phone]       Fax: [phone]   RxID:   [id number]   Appended Document: past due for 6 mth f/u per  pt phone call/tg Patient seen indepedently by Stefanie [name].  To follow-up with Dr. Dietrich Pates.

## 2010-08-15 NOTE — Miscellaneous (Signed)
Summary: LABS CMP,LIPIDS,A1C,06/29/2009  Clinical Lists Changes  Observations: Added new observation of CALCIUM: 9.3 mg/dL (16/04/9603 54:09) Added new observation of ALBUMIN: 4.1 g/dL (81/19/1478 29:56) Added new observation of PROTEIN, TOT: 6.7 g/dL (21/30/8657 84:69) Added new observation of SGPT (ALT): 21 units/L (08/30/2008 14:14) Added new observation of SGOT (AST): 18 units/L (08/30/2008 14:14) Added new observation of ALK PHOS: 36 units/L (08/30/2008 14:14) Added new observation of GFR AA: 48 mL/min/1.68m2 (08/30/2008 14:14) Added new observation of GFR: 58 mL/min (08/30/2008 14:14) Added new observation of CREATININE: 1.11 mg/dL (62/95/2841 32:44) Added new observation of BUN: 36 mg/dL (07/18/7251 66:44) Added new observation of BG RANDOM: 89 mg/dL (03/47/4259 56:38) Added new observation of CO2 PLSM/SER: 27 meq/L (08/30/2008 14:14) Added new observation of CL SERUM: 103 meq/L (08/30/2008 14:14) Added new observation of K SERUM: 4.5 meq/L (08/30/2008 14:14) Added new observation of NA: 143 meq/L (08/30/2008 14:14) Added new observation of LDL: 131 mg/dL (75/64/3329 51:88) Added new observation of HDL: 60 mg/dL (41/66/0630 16:01) Added new observation of TRIGLYC TOT: 145 mg/dL (09/32/3557 32:20) Added new observation of CHOLESTEROL: 220 mg/dL (25/42/7062 37:62) Added new observation of HGBA1C: 6.1 % (08/30/2008 14:14)

## 2010-08-15 NOTE — Letter (Signed)
Summary: [place] [facility], agricultural at [facility]  [address] [place] [address]   Phone: [phone]  Fax: [phone]      September 21, 2009 MRN: [id number]   Stefanie [name] [address] [place], [place]  [address]   Dear [name],  Your test ordered by Selena Batten has been reviewed by your physician (or physician assistant) and was found to be normal or stable. Your physician (or physician assistant) felt no changes were needed at this time.  ____ Echocardiogram  ____ Cardiac Stress Test  __x__ Lab Work  ____ Peripheral vascular study of arms, legs or neck  ____ CT scan or X-ray  ____ Lung or Breathing test  ____ Other: No change in medical treatment at this time, per [name].  Enclosed is a copy of your labwork for your records.   Thank you, [name] Allyne Gee RN    [place] Bing, MD, Lenise Arena.C.Gaylord Shih, MD, F.A.C.C Lewayne Bunting, MD, F.A.C.C Nona Dell, MD, F.A.C.C Charlton Haws, MD, Lenise Arena.C.C

## 2010-09-29 LAB — DIFFERENTIAL
Basophils Absolute: 0 10*3/uL (ref 0.0–0.1)
Basophils Absolute: 0 10*3/uL (ref 0.0–0.1)
Basophils Absolute: 0 10*3/uL (ref 0.0–0.1)
Basophils Relative: 0 % (ref 0–1)
Basophils Relative: 0 % (ref 0–1)
Basophils Relative: 1 % (ref 0–1)
Eosinophils Absolute: 0.1 10*3/uL (ref 0.0–0.7)
Eosinophils Absolute: 0.2 10*3/uL (ref 0.0–0.7)
Eosinophils Absolute: 0.6 10*3/uL (ref 0.0–0.7)
Eosinophils Relative: 3 % (ref 0–5)
Eosinophils Relative: 8 % — ABNORMAL HIGH (ref 0–5)
Lymphocytes Relative: 13 % (ref 12–46)
Lymphocytes Relative: 16 % (ref 12–46)
Lymphocytes Relative: 22 % (ref 12–46)
Lymphs Abs: 1.4 10*3/uL (ref 0.7–4.0)
Lymphs Abs: 1.6 10*3/uL (ref 0.7–4.0)
Monocytes Absolute: 0.6 10*3/uL (ref 0.1–1.0)
Monocytes Absolute: 0.6 10*3/uL (ref 0.1–1.0)
Monocytes Absolute: 0.6 10*3/uL (ref 0.1–1.0)
Monocytes Absolute: 0.7 10*3/uL (ref 0.1–1.0)
Monocytes Absolute: 0.9 10*3/uL (ref 0.1–1.0)
Monocytes Relative: 3 % (ref 3–12)
Monocytes Relative: 5 % (ref 3–12)
Monocytes Relative: 6 % (ref 3–12)
Monocytes Relative: 7 % (ref 3–12)
Monocytes Relative: 8 % (ref 3–12)
Monocytes Relative: 9 % (ref 3–12)
Neutro Abs: 14.8 10*3/uL — ABNORMAL HIGH (ref 1.7–7.7)
Neutro Abs: 3.9 10*3/uL (ref 1.7–7.7)
Neutro Abs: 6.5 10*3/uL (ref 1.7–7.7)
Neutro Abs: 9.7 10*3/uL — ABNORMAL HIGH (ref 1.7–7.7)

## 2010-09-29 LAB — BASIC METABOLIC PANEL
BUN: 28 mg/dL — ABNORMAL HIGH (ref 6–23)
CO2: 25 mEq/L (ref 19–32)
Calcium: 8.6 mg/dL (ref 8.4–10.5)
Calcium: 9.1 mg/dL (ref 8.4–10.5)
Chloride: 105 mEq/L (ref 96–112)
Chloride: 108 mEq/L (ref 96–112)
GFR calc Af Amer: 48 mL/min — ABNORMAL LOW (ref >60)
GFR calc non Af Amer: 40 mL/min — ABNORMAL LOW (ref >60)
GFR calc non Af Amer: 41 mL/min — ABNORMAL LOW (ref >60)
GFR calc non Af Amer: 48 mL/min — ABNORMAL LOW (ref >60)
GFR calc non Af Amer: 54 mL/min — ABNORMAL LOW (ref >60)
Glucose, Bld: 148 mg/dL — ABNORMAL HIGH (ref 70–99)
Glucose, Bld: 95 mg/dL (ref 70–99)
Potassium: 3.4 mEq/L — ABNORMAL LOW (ref 3.5–5.1)
Potassium: 3.8 mEq/L (ref 3.5–5.1)
Potassium: 4.2 mEq/L (ref 3.5–5.1)
Sodium: 138 mEq/L (ref 135–145)
Sodium: 140 mEq/L (ref 135–145)
Sodium: 141 mEq/L (ref 135–145)

## 2010-09-29 LAB — CBC
HCT: 31 % — ABNORMAL LOW (ref 36.0–46.0)
HCT: 31.1 % — ABNORMAL LOW (ref 36.0–46.0)
HCT: 31.8 % — ABNORMAL LOW (ref 36.0–46.0)
HCT: 32.9 % — ABNORMAL LOW (ref 36.0–46.0)
HCT: 36.3 % (ref 36.0–46.0)
Hemoglobin: 10.2 g/dL — ABNORMAL LOW (ref 12.0–15.0)
Hemoglobin: 10.3 g/dL — ABNORMAL LOW (ref 12.0–15.0)
Hemoglobin: 10.7 g/dL — ABNORMAL LOW (ref 12.0–15.0)
Hemoglobin: 10.9 g/dL — ABNORMAL LOW (ref 12.0–15.0)
Hemoglobin: 11.9 g/dL — ABNORMAL LOW (ref 12.0–15.0)
MCH: 27.8 pg (ref 26.0–34.0)
MCH: 28 pg (ref 26.0–34.0)
MCHC: 32.8 g/dL (ref 30.0–36.0)
MCHC: 33 g/dL (ref 30.0–36.0)
MCHC: 33.2 g/dL (ref 30.0–36.0)
MCV: 84.4 fL (ref 78.0–100.0)
MCV: 85.2 fL (ref 78.0–100.0)
MCV: 85.3 fL (ref 78.0–100.0)
Platelets: 253 10*3/uL (ref 150–400)
RBC: 3.64 MIL/uL — ABNORMAL LOW (ref 3.87–5.11)
RBC: 3.75 MIL/uL — ABNORMAL LOW (ref 3.87–5.11)
RBC: 3.86 MIL/uL — ABNORMAL LOW (ref 3.87–5.11)
RDW: 17.1 % — ABNORMAL HIGH (ref 11.5–15.5)
RDW: 17.2 % — ABNORMAL HIGH (ref 11.5–15.5)
RDW: 17.4 % — ABNORMAL HIGH (ref 11.5–15.5)
WBC: 16.9 10*3/uL — ABNORMAL HIGH (ref 4.0–10.5)
WBC: 9 10*3/uL (ref 4.0–10.5)

## 2010-09-29 LAB — GLUCOSE, CAPILLARY
Glucose-Capillary: 109 mg/dL — ABNORMAL HIGH (ref 70–99)
Glucose-Capillary: 115 mg/dL — ABNORMAL HIGH (ref 70–99)
Glucose-Capillary: 87 mg/dL (ref 70–99)
Glucose-Capillary: 90 mg/dL (ref 70–99)
Glucose-Capillary: 91 mg/dL (ref 70–99)
Glucose-Capillary: 92 mg/dL (ref 70–99)
Glucose-Capillary: 93 mg/dL (ref 70–99)
Glucose-Capillary: 93 mg/dL (ref 70–99)

## 2010-09-29 LAB — URINE CULTURE
Colony Count: 35000
Culture  Setup Time: 201108021944

## 2010-09-29 LAB — CULTURE, BLOOD (ROUTINE X 2)
Culture: NO GROWTH
Culture: NO GROWTH
Report Status: 8082011

## 2010-09-29 LAB — TYPE AND SCREEN

## 2010-09-29 LAB — URINALYSIS, ROUTINE W REFLEX MICROSCOPIC
Bilirubin Urine: NEGATIVE
Hgb urine dipstick: NEGATIVE
Ketones, ur: NEGATIVE mg/dL
Nitrite: NEGATIVE
Protein, ur: NEGATIVE mg/dL
Specific Gravity, Urine: 1.02 (ref 1.005–1.030)
Urobilinogen, UA: 0.2 mg/dL (ref 0.0–1.0)

## 2010-09-29 LAB — APTT: aPTT: 40 seconds — ABNORMAL HIGH (ref 24–37)

## 2010-09-29 LAB — COMPREHENSIVE METABOLIC PANEL
ALT: 13 U/L (ref 0–35)
Albumin: 2.2 g/dL — ABNORMAL LOW (ref 3.5–5.2)
Alkaline Phosphatase: 52 U/L (ref 39–117)
Chloride: 108 mEq/L (ref 96–112)
Glucose, Bld: 104 mg/dL — ABNORMAL HIGH (ref 70–99)
Potassium: 3.6 mEq/L (ref 3.5–5.1)
Sodium: 139 mEq/L (ref 135–145)
Total Bilirubin: 1 mg/dL (ref 0.3–1.2)
Total Protein: 5.6 g/dL — ABNORMAL LOW (ref 6.0–8.3)

## 2010-09-29 LAB — PROTIME-INR: INR: 1.31 (ref 0.00–1.49)

## 2010-10-30 LAB — BLOOD GAS, ARTERIAL
Acid-Base Excess: 1.6 mmol/L (ref 0.0–2.0)
Bicarbonate: 25.8 mEq/L — ABNORMAL HIGH (ref 20.0–24.0)
FIO2: 2 %
TCO2: 22.7 mmol/L (ref 0–100)
pCO2 arterial: 41.4 mmHg (ref 35.0–45.0)
pH, Arterial: 7.411 — ABNORMAL HIGH (ref 7.350–7.400)

## 2010-10-30 LAB — DIFFERENTIAL
Basophils Absolute: 0 10*3/uL (ref 0.0–0.1)
Basophils Relative: 0 % (ref 0–1)
Basophils Relative: 1 % (ref 0–1)
Eosinophils Absolute: 0 10*3/uL (ref 0.0–0.7)
Eosinophils Absolute: 0.6 10*3/uL (ref 0.0–0.7)
Eosinophils Relative: 0 % (ref 0–5)
Lymphs Abs: 0.6 10*3/uL — ABNORMAL LOW (ref 0.7–4.0)
Lymphs Abs: 2.6 10*3/uL (ref 0.7–4.0)
Monocytes Absolute: 0.9 10*3/uL (ref 0.1–1.0)
Monocytes Relative: 8 % (ref 3–12)
Neutrophils Relative %: 61 % (ref 43–77)

## 2010-10-30 LAB — BASIC METABOLIC PANEL
BUN: 26 mg/dL — ABNORMAL HIGH (ref 6–23)
BUN: 34 mg/dL — ABNORMAL HIGH (ref 6–23)
CO2: 27 mEq/L (ref 19–32)
Chloride: 102 mEq/L (ref 96–112)
Chloride: 108 mEq/L (ref 96–112)
Creatinine, Ser: 1.1 mg/dL (ref 0.4–1.2)
Glucose, Bld: 105 mg/dL — ABNORMAL HIGH (ref 70–99)
Glucose, Bld: 223 mg/dL — ABNORMAL HIGH (ref 70–99)
Potassium: 3.8 mEq/L (ref 3.5–5.1)
Potassium: 4.1 mEq/L (ref 3.5–5.1)

## 2010-10-30 LAB — CBC
HCT: 38.8 % (ref 36.0–46.0)
HCT: 42.4 % (ref 36.0–46.0)
MCHC: 32.8 g/dL (ref 30.0–36.0)
MCV: 88.1 fL (ref 78.0–100.0)
MCV: 88.4 fL (ref 78.0–100.0)
Platelets: 291 10*3/uL (ref 150–400)
Platelets: 332 10*3/uL (ref 150–400)
RDW: 14.8 % (ref 11.5–15.5)
WBC: 11 10*3/uL — ABNORMAL HIGH (ref 4.0–10.5)

## 2010-10-30 LAB — CARDIAC PANEL(CRET KIN+CKTOT+MB+TROPI)
CK, MB: 11.3 ng/mL — ABNORMAL HIGH (ref 0.3–4.0)
Relative Index: 5.2 — ABNORMAL HIGH (ref 0.0–2.5)
Troponin I: 0.03 ng/mL (ref 0.00–0.06)
Troponin I: 0.05 ng/mL (ref 0.00–0.06)

## 2010-10-30 LAB — CK TOTAL AND CKMB (NOT AT ARMC): Total CK: 238 U/L — ABNORMAL HIGH (ref 7–177)

## 2010-10-30 LAB — POCT CARDIAC MARKERS
Myoglobin, poc: 461 ng/mL (ref 12–200)
Troponin i, poc: <0.05 ng/mL (ref 0.00–0.09)

## 2010-10-30 LAB — TROPONIN I: Troponin I: 0.03 ng/mL (ref 0.00–0.06)

## 2010-10-31 LAB — GLUCOSE, CAPILLARY: Glucose-Capillary: 118 mg/dL — ABNORMAL HIGH (ref 70–99)

## 2010-11-28 NOTE — H&P (Signed)
Stefanie [name], Stefanie [name]                ACCOUNT NO.:  [id number]   MEDICAL RECORD NO.:  [id number]          PATIENT TYPE:  INP   LOCATION:  A210                          FACILITY:  [facility]   PHYSICIAN:  Melvyn Novas, MDDATE OF BIRTH:  01-12-1933   DATE OF ADMISSION:  04/11/2007  DATE OF DISCHARGE:  LH                              HISTORY & PHYSICAL   The patient is a 77 year old obese white female recently and currently  being treated for diverticulitis, was hospitalized a week-and-a-half  ago, has been home for 3 or 4 days.  The patient was recommended to  have, I believe, a colectomy.  Today she recounted some left- and right-  sided tight or pressure pain which persisted for several minutes  associated with a cold and clammy feeling according to her but without  any nausea or vomiting.  She did have some palpitations.  She denied any  syncope.  She denied any exertional component.  The patient presents to  the ER via ambulance appearing somewhat anxious and cardiac enzymes are  essentially negative with no significant EKG changes.  She was seen by  Dr. Dietrich Pates previously.  Echocardiogram shows normal systolic function.  She is anxious about a cardiac evaluation prior to elective colectomy.  She is currently on Levaquin.  The patient admitted for chest pain, rule  out ischemic event.   PAST MEDICAL HISTORY:  Significant for:  1. Hypertension.  2. Diverticulitis.  3. GERD.  4. Hiatal hernia.  5. Hyperlipidemia.  6. Interstitial cystitis.  7. Osteomyelitis.   PAST SURGICAL HISTORY:  Remarkable for:  1. Cholecystectomy.  2. Lumbosacral laminectomy in 1992.  3. Bilateral tubal ligation.  4. Screws in her right ankle which were subsequently removed due to      osteomyelitis.   CURRENT MEDICINES:  1. Niaspan 100 mg h.s.  2. Levaquin 750 or 500 p.o. daily for 10 days.  3. Bumex 1 mg per day.  4. Zetia 10 daily.  5. Norvasc 10 a day.  6. Toprol-XL 50 a day.  7. Aspirin  81 a day.  8. Lasix 40 a day.  9. Valium 25 p.r.n.  10.Potassium chloride 20 mEq daily.  11.Calcium supplement.  12.Prilosec 20 mg daily.   Blood pressure is 107/33, temperature is 99.3, pulse is 96 and regular,  respiratory rate is 24, O2 saturation is 100%.  EYES:  PERRLA, extraocular movements intact, sclerae clear, conjunctivae  pink.  NECK:  Shows no JVD, no carotid bruits, no thyromegaly, no thyroid  bruits.  LUNGS:  Clear to A&P.  No rales, wheezes or rhonchi.  HEART:  Regular rhythm.  No murmurs, gallops, heaves, thrills or rubs.  ABDOMEN:  Obese, soft, questionable mild left lower quadrant tenderness.  No guarding or rebound.  No masses, no megaly.  EXTREMITIES:  Trace to 1+ pedal edema with support hose currently on.  NEUROLOGIC:  Cranial nerves grossly intact.  The patient moves all four  extremities.   IMPRESSION:  1. Typical/atypical chest pain.  2. Diverticulitis.  3. Anxiety surrounding elective colectomy.  4. Hyperlipidemia.  5. Hypertension.  6.  Adiposity.  7. Gastroesophageal reflux disease.   The plan is to admit, get cardiac enzymes in the morning, repeat EKG,  will obtain cardiac consultation with Dr. Dietrich Pates per patient request  for Monday, and will make further recommendations as the database  expands.      Melvyn Novas, MD  Electronically Signed     RMD/MEDQ  D:  04/11/2007  T:  04/12/2007  Job:  [id number]

## 2010-11-28 NOTE — H&P (Signed)
Stefanie [name], Stefanie [name]                ACCOUNT NO.:  [id number]   MEDICAL RECORD NO.:  [id number]          PATIENT TYPE:  INP   LOCATION:  A336                          FACILITY:  [facility]   PHYSICIAN:  Catalina Pizza, M.D.        DATE OF BIRTH:  Mar 03, 1933   DATE OF ADMISSION:  07/27/2008  DATE OF DISCHARGE:  LH                              HISTORY & PHYSICAL   CHIEF COMPLAINT:  Bronchitis and shortness of breath and chest  tightness.   HISTORY OF PRESENT ILLNESS:  Stefanie [name] is a 77 year old morbidly obese  white female who for approximately last 3 weeks has been having cough,  congestion, wheezing, and then treated for bronchitis.  She has had  approximately 2 weeks of Levaquin as well as steroid taper with a follow  up with a week of 20 mg of steroids.  Apparently, she is still treating  the symptoms, but was having more chest tightness with wheezing both,  last night and this morning which concerned her.  She called Dr.  Dietrich Pates with fears that something was going on with her heart and was  told to come to the emergency department for further assessment.  She  was seen in the emergency department and felt that given her multiple  medical problems and continued course that she needs IV therapy and  close monitoring in the hospital.  Stefanie [name] stated the chest  tightness was not specifically a pain, did not have any radiation to  this, did not have any nausea, vomiting.  She did note that she has had  difficulty with swallowing occasionally and does have history of  esophageal dilatation needed before she questioned whether some of this  could be occurring as well.  She notes the wheezing is worsened in the  morning and night and she is producing a white, clearish sputum, which  she states is very thick, unable to get this up and out.   PAST MEDICAL HISTORY:  Significant for hypertension, diverticulitis,  gastroesophageal reflux disease, hiatal hernia, hyperlipidemia,  interstitial  cystitis, osteomyelitis, anxiety.   PAST SURGICAL HISTORY:  History of cholecystectomy, lumbosacral  laminectomy in 1992, bilateral tubal ligation, right ankle surgery with  screw placement, but had resulted in osteomyelitis.   CURRENT MEDICATIONS:  1. Bumex 1 mg p.o. daily.  2. Metoprolol 50 mg p.o. b.i.d.  3. Aspirin 81 mg p.o. daily.  4. Prilosec 20 mg p.o. daily.  5. Calcium 500 mg once daily.  6. Multivitamin once daily.  7. Potassium chloride 20 mEq p.o. daily.  8. Trilipix 135 mg once daily.  9. Allegra 180 mg once daily.  10.Black Cherry concentrate 2 capsules once daily.   Multiple allergies and sensitivities to medicines including MONOPRIL,  MACROBID, CODEINE, PRAVACHOL, CIMETIDINE, CIPRO, METRONIDAZOLE, LIPITOR,  HYDROCODONE, ZOCOR.   FAMILY HISTORY:  Sister with some sort of colitis, had a brother with  esophageal cancer.  Brother with prostate cancer and diabetes.  She had  family history of congestive heart failure, coronary disease, multiple  sclerosis, and brain tumors previously in her family.   SOCIAL  HISTORY:  She is married, lives at home with her spouse.  Does  not drink or use any drugs.  No smoking.  She has a daughter who is very  involved in her care.   REVIEW OF SYSTEMS:  Per HPI.  Denies any specific abdominal pains.  No  nausea and vomiting.  No problems with bowel or bladder.  No neurologic  complaints.   VITAL SIGNS:  Temperature is 98.3, blood pressure is 118/40, pulse rate  74, respirations ranged from 18 and 24, sating 96% on room air.  GENERAL:  This is a morbidly obese white female laying in bed in no  acute distress, somewhat emotional related to current situation.  HEENT:  Pupils equal, round, and reactive to light and accommodation.  Oropharynx is clear.  NECK:  No JVD.  Difficult to obtain secondary to size of her neck.  No  carotid bruits.  LUNGS:  Good air movement throughout, no appreciating wheezing at this  time.  Does have upper  airway coarse breath sounds.  No specific  consolidation.  HEART:  Regular rate and rhythm.  Distant heart sounds.  ABDOMEN:  Protuberant, soft, and nontender.  Positive bowel sounds.  EXTREMITIES:  Trace lower extremity edema bilaterally which is chronic.  NEUROLOGIC:  The patient is alert and oriented x3.  No known deficits  noted.   LABORATORY DATA:  Cardiac markers:  Myoglobin 461, MB of 12.7, troponin  I of less than 0.01.  Followup CK showed CK of 238, MB of 14.2, and  troponin I of 0.03.  CBC showed white count 11.0, hemoglobin 13.8,  platelet count of 332, absolute neutrophil count of 6.7.  BMET shows  sodium 140, potassium 3.8, chloride 102, CO2 of 27, glucose 105, BUN of  34, creatinine of 1.30, calcium of 8.6.  BNP of 61.3.  ABG on 2 L oxygen  show pH of 7.41, pCO2 of 41, pO2 of 84 with oxygen sat at 96.8.   Chest x-ray reveals cardiomegaly, no definitive acute process noted.   EKG shows normal sinus rhythm 74 beats per minute, no specific ST wave  changes, unchanged compared to previous EKG.   IMPRESSION:  This is a 77 year old morbidly obese white female with what  appears to be continued bronchitis with shortness of breath and chest  tightness.   ASSESSMENT/PLAN:  1. Bronchitis/shortness of breath.  We will continue to treat with      nebulizer treatment with Atrovent and albuterol.  We will cover      with Solu-Medrol currently.  She has had a full course of Levaquin      and is afebrile.  No significant white count, but will monitor for      any signs of fever and if she has, then will resume on a different      antibiotic.  We will use O2 as needed with the help with a feeling      of shortness of breath.  2. Chest tightness.  No specific complaints related to heart that will      be alarming, but will cycle enzymes.  She did have elevated MB but      no elevation in troponin I, but we will cycle enzymes for that.  No      acute findings on EKG.  No signs of acute  fluid overload or      congestive heart failure.  If she has any further complaints, then      we will  get Dr. Dietrich Pates involved.  3. Dysphasia/esophageal dysmotility.  She occasionally have issues      related to this and she says this has been going on for sometime.      She had been taking omeprazole for heartburn, reflux with benefit,      but continues to have difficulty with swallowing fluids      occasionally, has been seen and followed by Dr. Karilyn Cota before and      wishes to have this reevaluated by him as an outpatient.  I      question whether some of this is causing some of her chest      discomfort, it is unclear.  4. Morbid obesity.  I have discussed further with the patient about      the need for weight loss, but is very limited due to her body      habitus and joint discomfort, as far as exercise is concerned.   DISPOSITION:  We will admit to telemetry and get further laboratory  data.  The patient of note does show improvement just in the emergency  department after her current therapy.      Catalina Pizza, M.D.  Electronically Signed     ZH/MEDQ  D:  07/27/2008  T:  07/28/2008  Job:  [id number]

## 2010-11-28 NOTE — Procedures (Signed)
NAME:  Stefanie [name], Stefanie [name]                ACCOUNT NO.:  [id number]   MEDICAL RECORD NO.:  [id number]          PATIENT TYPE:  OUT   LOCATION:  SLEE                          FACILITY:  [facility]   PHYSICIAN:  Kofi A. Gerilyn Pilgrim, M.D. DATE OF BIRTH:  1933-06-02   DATE OF PROCEDURE:  09/16/2008  DATE OF DISCHARGE:  09/16/2008                             SLEEP DISORDER REPORT   REFERRING PHYSICIAN:  Gerrit Friends. Dietrich Pates, MD, [facility]   INDICATIONS:  This is a 77 year old lady who presents with snoring,  restless sleep, and dyspnea.  She is being evaluated for obstructive  sleep apnea syndrome.   MEDICATIONS:  Polyethylene glycol, potassium, Allegra, Bumex,  metoprolol, Actos, omeprazole, aspirin, calcium, multivitamin, vitamin  D.   EPWORTH SLEEPINESS SCALE:  8.   ARCHITECTURAL SUMMARY:  This is a split night recording with the first  half being a diagnostic and a second half a titration.  The total  recording time was 362 minutes.  Sleep efficiency 76%.  Sleep latency 16  minutes, REM latency 187 minutes.   RESPIRATORY SUMMARY:  Baseline oxygen saturation is 92, the lowest  oxygen saturation is 70.  Diagnostic AHI is 47.  The patient was  titrated between pressures of 5 and 15.  She appears to be doing well on  pressure of 13 and I believe this is an optimal pressure.  She did have  a desaturation on a pressure of 11 and 1 L of oxygen was added.  This  was the lowest desaturation to 70, but the 1 L did keep her saturations  above 88.   LIMB MOVEMENT SUMMARY:  PLM index 0.   ELECTROCARDIOGRAM SUMMARY:  Average heart rate 65 with isolated PVCs  observed.   IMPRESSION:  Severe obstructive sleep apnea syndrome which responds well  to a CPAP of 13 and 1 L supplemental oxygen.      Kofi A. Gerilyn Pilgrim, M.D.  Electronically Signed     KAD/MEDQ  D:  09/18/2008  T:  09/19/2008  Job:  [id number]

## 2010-11-28 NOTE — Group Therapy Note (Signed)
NAME:  Stefanie [name], Stefanie [name]                ACCOUNT NO.:  [id number]   MEDICAL RECORD NO.:  [id number]           PATIENT TYPE:  INP   LOCATION:  A220                          FACILITY:  [facility]   PHYSICIAN:  [name], MD   DATE OF BIRTH:  09-11-1932   DATE OF PROCEDURE:  03/30/2007  DATE OF DISCHARGE:                                 PROGRESS NOTE   This patient continues to feel some better. She has had less pain and  less dyspnea.  She does have evidence of diverticulitis. A CT of abdomen  showed sigmoid diverticulitis contained perforation of sigmoid colon  with episodes of nausea.  She is felt to have a more recent evidence of  congestive heart failure with decreased IV fluids. Started her on Lasix,  and she seems to be some better. Did cycle her cardiac markers, and they  are trending down.  Total CPK now is 192, CPK-MB 3.6, relative index  1.9, troponin less than 0.01.  Repeat EKG shows normal sinus rhythm with  no acute changes.   OBJECTIVE:  VITAL SIGNS:  Blood pressure 111/47, respiration 20, pulse  80, temperature of 98.1. She has a negative fluid output.  HEART:  Regular rhythm.  LUNGS:  Bilateral rhonchi, diminished breath sounds.  ABDOMEN:  Obese, slightly tender in the left lower quadrant   ASSESSMENT:  The patient was admitted with lower abdominal pain.  She  does have contained perforation of sigmoid colon with evidence of  diverticulitis.  She has more recently developed pulmonary edema due to  fluid overload.  She has not had hypertension, dyslipidemia, paroxysmal  atrial fibrillation.   PLAN:  1. Continue current regimen.  2. The patient will have a 2-D echocardiogram.  3. Will continue to monitor BMET and CBC.      [name], MD  Electronically Signed     AGM/MEDQ  D:  03/30/2007  T:  03/30/2007  Job:  [id number]

## 2010-11-28 NOTE — Group Therapy Note (Signed)
NAME:  Stefanie [name], Stefanie [name]                ACCOUNT NO.:  [id number]   MEDICAL RECORD NO.:  [id number]          PATIENT TYPE:  INP   LOCATION:  A340                          FACILITY:  [facility]   PHYSICIAN:  [name], MD   DATE OF BIRTH:  1932-10-22   DATE OF PROCEDURE:  DATE OF DISCHARGE:                                 PROGRESS NOTE   The patient was admitted with left lower quadrant pain, nausea, and  vomiting, and she remains on IV unison.  She does have evidence of  diverticulitis.  CT of abdomen showed sigmoid diverticulitis containing  perforation of sigmoid mesocolon.   OBJECTIVE:  VITAL SIGNS:  Blood pressure 127/50, respirations 20, pulse  78, temperature 98.9.  Lab values show white blood count 13.6,  hemoglobin 14.2, hematocrit 42.6.  Chemistries have been sent and show  normal exception of slightly low serum potassium 3.4.    HEART:  Regular rhythm.  LUNGS:  Clear to P&A.  ABDOMEN:  Slightly distended.  Tenderness over the left lower quadrant.   ASSESSMENT:  The patient has diverticulitis which contains perforation  of sigmoid mesocolon.  The patient is being seen by Dr. Lovell Sheehan as well,  who feels that she needs to continue antibiotics with a total of 2  weeks.  He said that she needed partial colectomy at the end of this  period of time.   PLAN:  To continue current regimen.      [name], MD  Electronically Signed     AGM/MEDQ  D:  03/25/2007  T:  03/25/2007  Job:  [id number]

## 2010-11-28 NOTE — Letter (Signed)
September 13, 2008    Catalina Pizza, MD  [address] [place],  [place] [address]   RE:  Stefanie, [name]  MRN:  [id number]  /  DOB:  [birth year]   Dear Stefanie [name],   Stefanie [name] returns to the office for continued assessment and treatment  of dyspnea.  This has been occurring particularly with mild exertion,  which is all she can manage, but also sometimes at rest.  Her family has  noted wheezing intermittently.  She sleeps poorly at night and sometimes  wakes up breathless.  She has daytime somnolence.  She has chronic  urinary stress incontinence.  She has had no chest pain, orthopnea, or  PND.  During a recent hospitalization, there were no notable findings on  chest x-ray.  BNP was normal.  Her most recent echocardiogram was in  2008, at which time left ventricular systolic function was normal.   MEDICATIONS:  She recently was evaluated by Dr. Dionicia Abler for dysphasia.  He  performed an esophageal dilatation.   Medications are unchanged from her recent visit except for the addition  of omeprazole 20 mg daily, Actos 15 mg daily, vitamin D, and Ventolin.  She is using the Ventolin approximately 4 times a day with immediate but  transient relief.   PHYSICAL EXAMINATION:  GENERAL:  Overweight woman in no acute distress.  VITAL SIGNS:  The blood pressure is 150/70, heart rate 66 and regular,  respirations 14.  NECK:  No jugular venous distention; no carotid bruits.  LUNGS:  Clear without significant wheezing or prolongation of the  expiratory phase.  CARDIAC:  Distant first and second heart sounds; modest systolic  ejection murmur.  ABDOMEN:  Soft and nontender; no organomegaly.  EXTREMITIES:  No edema.   IMPRESSION:  Stefanie [name] has a vague history of asthmatic bronchitis.  She has never had symptoms to the extent that she is experiencing them  now and was never hospitalized prior to January.  She certainly has  findings that could reflect sleep apnea.  A sleep study will be  obtained.  We will  pursue treatment of asthma for the time being by  substituting Symbicort for her Ventolin and reserving the Ventolin as a  rescue inhaler.  If this is ineffective, we will have to consider other  diagnoses such as congestive heart failure, pulmonary embolism, or some  other primary lung disease.  For now, pulmonary function studies will be  obtained.  I will reserve echocardiography, CT of the chest, assessment  of D-dimer, ect., after initial testing and evaluate her response to  therapy.  I will see this nice woman again in 2 weeks.    Sincerely,      Gerrit Friends. Dietrich Pates, MD, [facility]  Electronically Signed    RMR/MedQ  DD: 09/13/2008  DT: 09/14/2008  Job #: [id number]

## 2010-11-28 NOTE — Discharge Summary (Signed)
Stefanie [name], Stefanie [name]                ACCOUNT NO.:  [id number]   MEDICAL RECORD NO.:  [id number]          PATIENT TYPE:  INP   LOCATION:  A210                          FACILITY:  [facility]   PHYSICIAN:  Catalina Pizza, M.D.        DATE OF BIRTH:  November 03, 1932   DATE OF ADMISSION:  04/11/2007  DATE OF DISCHARGE:  09/28/2008LH                               DISCHARGE SUMMARY   DISCHARGE DIAGNOSES:  1. Acute chest pain felt secondary to costochondritis versus      musculoskeletal pain.  2. Diverticulitis.  3. Nausea.  4. Anxiety.  5. Hypotension.  6. Morbid obesity  7. Hyperlipidemia  8. Mild hyperglycemia   DISCHARGE MEDICATIONS:  1. Potassium 20 mEq p.o. daily.  2. Niaspan 1000 mg p.o. nightly.  3. Levaquin 500 mg p.o. daily.  4. Bumex 1 mg p.o. daily.  5. Metoprolol 25 mg b.i.d.  6. Aspirin 81 mg p.o. daily.  7. Prilosec 20 mg p.o. daily.  8. Calcium 500 mg p.o. daily.  9. Multivitamin 1 tablet p.o. daily.  10.Zofran 4 mg 1 tablet q.6 h p.r.n. for nausea.  11.She is to stop furosemide, Zetia, and amlodipine.   BRIEF HISTORY OF PRESENT ILLNESS:  Ms. Gattuso is a 77 year old obese  white female who had a prolonged hospitalization and discharged  apparently on September 16 due to diverticulitis. At that time, she was  suggested a have a colectomy and was sent home to finish her antibiotics  and set up for outpatient appointment with the surgeon for possible  elective colectomy given the significance of her diverticulitis.  Apparently she had feelings of cold, clammy, and nausea-type feeling  with what she describes of some palpitations. Denied any specific  syncope-type symptoms. She was brought in for chest pain to rule out any  ischemic event given her multiple risk factors.   PHYSICAL EXAMINATION AT TIME OF DISCHARGE:  VITAL SIGNS:  T-max of 99.3,  blood pressure currently 97.7, blood pressure 117/56. Did have some low  blood pressure of 73/32 with pulse of 86, respirations 18,  saturating  96% on 2 liters of oxygen.  GENERAL:  This is a morbidly obese white female lying in bed in no acute  distress.  HEENT:  Unremarkable.  Pupils equal, round, and react to light and  accommodation.  Mucous membranes are moist.  NECK:  Shows no JVD, no carotid bruits.  LUNGS:  Clear to auscultation bilaterally.  No rhonchi or wheezing.  No  crackles appreciated.  HEART:  Regular rate and rhythm.  Distant heart sounds but did not  appreciate any murmurs.  ABDOMEN:  Soft, protuberant.  She does have some point tenderness in  left lower quadrant but does not have constant pain related this. No  specific guarding.  EXTREMITIES:  Trace lower extremity edema bilaterally.  NEUROLOGIC:  Symmetric. Moving all extremities without difficulty.  MUSCULOSKELETAL:  The patient does have point tenderness to palpation in  the left sternal area which she states is the chest pain that she is  describing.   PERTINENT LABORATORY WORK OBTAINED DURING HOSPITALIZATION:  Initial  CBC  showed white count 16.5, hemoglobin of 12.9.  CBC at time of discharge  as showed white count 15.9, hemoglobin 11.6. BNP was 56.  BMET initially  showed glucose 146, creatinine of 1.35.  On the day of discharge,  glucose 159, creatinine of 1.20, calcium 8.3.  Cardiac panel x4 shows CK  of 103, 97, 90, and 86, respectively. MB 5.4, 6.0, 5.4, and 5.1,  respectively. Troponin I of 0.03, 0.02, 0.02, and 0.03, respectively.  Blood cultures x2 are negative to date.   Chest x-ray obtained showed cardiomegaly without edema.  Question of  left basilar atelectasis.   HOSPITAL COURSE PER PROBLEM.:  #1.  CHEST PAIN:  She did have several serial EKGs obtained which did  not show any acute ST wave abnormalities, read as normal sinus rhythm,  very atypical in nature for angina or ischemic type with negative  enzymes.  She does have appointment with Dr. Dietrich Pates on Tuesday and  definitely needs to follow up with him.  She may need  further preop  clearance from the cardiac standpoint given she does have multiple risk  factors.   #2.  DIVERTICULITIS:  Will continue on Levaquin 500 mg p.o. daily. She  states she was not able to tolerate the 750 mg as well. Question whether  or not she needs to have broader spectrum to treat this.  Her white  count did improve some.  Given the fact that she was told that she  needed to have a colectomy, tried to set up appointment with Dr. Wenda Low in [place] within the next week to see if she can have this  procedure done.   #3.  ANXIETY:  She has some mild anxiety related to having to go through  this procedure but did review risks versus benefit and need for having  to do this.   #4.  HYPERTENSION AND HYPOTENSION:  She continued to have low blood  pressures throughout hospitalization. Some of these were not  specifically accurate when rechecked manually; blood pressures were  ranging 100/60, but even with this, she does exhibit some signs of  dizziness and possible orthostasis.  Will hold her amlodipine for now  and decrease her metoprolol dose, continue only on the Bumex instead of  both Bumex and the furosemide.  Will get Dr. Marvel Plan opinion on this  as well.   #5.  NAUSEA:  Nausea has improved and will continue with Zofran p.r.n.,  which has worked for her, and attempt to get her to eat a little more  from a nutrition standpoint. She has done well during hospitalization  and did tolerate a normal diet.   #6  HYPERGLYCEMIA:  She does have some mildly elevated blood sugars.  She has never been told anything about elevated blood sugars before. She  says she checks it once weekly and usually runs in the 120s-130s range.  She likely needs a hemoglobin A1c checked and possibly started on  something for sugars. She is a setup for diabetes.   DISPOSITION:  Did discuss all this with her daughter, and she is  amenable to take her home today.  Will need to follow up in  the office  for repeat CBC and BMET towards the end of the week to see how she is  doing.      Catalina Pizza, M.D.  Electronically Signed     ZH/MEDQ  D:  04/13/2007  T:  04/13/2007  Job:  [id number]   cc:  Gerrit Friends. Dietrich Pates, MD, [facility]  [address]  [place], [place] [address]

## 2010-11-28 NOTE — Letter (Signed)
April 15, 2007    Catalina Pizza, M.D.  [address]  [place],  [place] [address]   RE:  Stefanie [name], Stefanie [name]  MRN:  [id number]  /  DOB:  Jun 30, 1933   Dear Ian Malkin:   Stefanie [name] returns to the office following 2 recent admissions to [facility]. On the first occasion, I saw her when she developed  congestive heart failure due to fluid retention during treatment for  acute diverticulitis. She returned with chest pain at which time  myocardial infarction was ruled out. At present, she is feeling fairly  well. Plans are for an elective partial colectomy for diverticular  disease, but she has not yet had initial evaluation with Dr. Wenda Low, her chosen surgeon.   CURRENT MEDICATIONS:  Include:  1. Aspirin 81 mg daily.  2. Metoprolol 25 mg b.i.d.  3. Calcium and vitamin D.  4. Niaspan 1000 mg daily.  5. K-CL 20 mEq daily.  6. Prilosec 1 daily.  7. Bumetanide 1 mg daily.  8. Levaquin 500 mg daily.   On exam, overweight pleasant woman in no acute distress. The weight is  280, unchanged since May. Blood pressure 110/60, heart rate 83 and  regular, respirations 18.  NECK: No jugular venous distension; no carotid bruits.  LUNGS: Clear.  CARDIAC: Normal first and second heart sounds.  ABDOMEN: Soft and nontender; normal bowel sounds; no organomegaly.  EXTREMITIES: Trace edema; distal pulses intact.   EKG: Normal sinus rhythm; delayed R wave progression- possible prior  anterior myocardial infarction; left axis deviation. When compared to a  prior tracing of October 06, 2003, inferior Q waves are no longer present.   IMPRESSION:  Stefanie [name] is doing well. She has congestive heart failure  with preserved left ventricular systolic function, which is now  compensated. Due to her recent episode of chest pain and of congestive  heart failure, we will proceed with a preoperative pharmacologic stress  Myoview study. If negative, I see no contraindication to the proposed  partial  colectomy. I will let you know the results of her stress test as  soon as it has been completed. Current medication is, for the most part,  totally appropriate. I think it would be best to defer adjustment of  lipid lowering medication until after her abdominal surgery and  subsequent recovery.    Sincerely,      Gerrit Friends. Dietrich Pates, MD, [facility]  Electronically Signed    RMR/MedQ  DD: 04/15/2007  DT: 04/15/2007  Job #: [id number]

## 2010-11-28 NOTE — Discharge Summary (Signed)
NAME:  Stefanie [name], Stefanie [name]                ACCOUNT NO.:  [id number]   MEDICAL RECORD NO.:  [id number]          PATIENT TYPE:  INP   LOCATION:  A220                          FACILITY:  [facility]   PHYSICIAN:  [name], MD   DATE OF BIRTH:  [birth year]   DATE OF ADMISSION:  03/23/2007  DATE OF DISCHARGE:  09/16/2008LH                               DISCHARGE SUMMARY   PATIENT PROFILE:  This is a 77 year old white female who was admitted  March 23, 2007, discharged April 01, 2007, nine days'  hospitalization.   DIAGNOSES:  1. Left lower quadrant pain secondary to recurrent diverticulitis with      perforation of sigmoid mesocolon.  2. Hypertension.  3. Atrial fibrillation.  4. Congestive heart failure.  5. Acute gout.  6. Hyperuricemia.  7. Hyperlipidemia.  8. Gastroesophageal reflux.  9. Hypokalemia.   CONDITION:  Stable and improved at time of her discharge.   This patient has had a previous history of multiple episodes of  diverticulitis.  She came into the emergency department on the 6th and  was treated with Levaquin, developed increasing problems with nausea,  vomiting, unable to keep anything down and left lower quadrant pain.  She does have a history gastroesophageal reflux disease, history of  hiatal hernia, hyperlipidemia, hypertension, osteoarthritis.  She was  admitted for intravenous antibiotics and replacement of potassium.   EXAMINATION:  GENERAL:  Obese female.  VITAL SIGNS:  Blood pressure  149/60, pulse 78, respiration 18, temperature 99.4.  HEENT:  Eyes:  PERRLA.  TMs negative.  Oropharynx benign.  NECK:  Supple.  No JVD or  thyroid abnormalities.  HEART:  Regular rhythm. No murmurs.  ABDOMEN:  Tenderness in the left lower quadrant.  Bowel sounds present and active.   LABORATORY DATA:  Admission CBC:  WBC 13,600 with hemoglobin 14.2,  hematocrit 42.6.  Subsequent CBC on March 20, 2007:  WBC 8600 with  hemoglobin 11.9, hematocrit of 36.3.  Chemistries on  admission showed a  sodium of 137, potassium 3.4, chloride 99, CO2 31, glucose 145, BUN 15,  creatinine 0.87 and on March 30, 2007, sodium 140, potassium 3.6,  chloride 103, CO2 30, glucose 123, BUN 5, creatinine 0.8.  GFR greater  than 60.  Cardiac markers on March 28, 2007:  CPK was 293, CPK-MB  8.3, relative index 2.8.  Subsequent markers:  CPK 192, CPK-MB 3.6,  relative index 1.9 with troponin less than 0.01.  TSH 3.352.   X-RAYS:  CT of the pelvis showed sigmoid diverticulitis with contained  perforation at the sigmoid mesocolon, air cavity in sigmoid mesocolon  measuring 4.6 cm.   Chest x-ray:  Cardiomegaly with vascular congestion and interstitial  pulmonary edema and bibasilar atelectasis, infiltrate and bilateral  pleural effusions.   Electrocardiogram:  Normal sinus rhythm, low voltage.   Echocardiogram:  Mild left ventricular dilatation, no hypertrophy,  normal left atrium, normal proximal ascending aorta, normal tricuspid  valve.   HOSPITAL COURSE:  The patient at the time of her admission was placed on  clear liquid diet, IV fluids of normal saline at 125 mL  per hour with 20  mEq of Kay Ciel.  She was continued on Niaspan 1000 mg daily, aspirin 81  mg daily, metoprolol 50 mg daily, Protonix 40 mg daily, Zetia 10 mg  daily, amlodipine 10 mg daily, Zofran 4 mg every 6 hours p.r.n. for  nausea, morphine 2 mg IV every 2 hours p.r.n. for pain.  She was started  on Levaquin 500 mg IV every 24 hours, Lovenox subcutaneously 40 mg  daily.  Shortly after admission, Dr. Lovell Sheehan put in a central line and  GI was consulted.  The patient did have a CT of the abdomen prior to  admission showed evidence of diverticulitis with sigmoid perforation of  the mesocolon.  The patient slowly improved during her hospital stay on  intravenous antibiotics.  The abdominal pain and tenderness gradually  decreased.  She did receive nasal O2 towards the latter part of her  hospitalization  and Xopenex inhaler.  The patient also developed acute  arthritis on the dorsum of the left foot which was thought to be acute  gout.  She was started at that point in time on prednisone 10 mg q.i.d.  She did have some element of CHF and was treated with Lasix.  Her  cardiac markers were cycled and remained normal.  The patient gradually  improved to the point she could be discharged on the 9th hospital day  with a plan to get her back to surgery with Dr. Lovell Sheehan to plan for  resection of the sigmoid at a later date.   CURRENT MEDICATIONS:  The patient was discharged on:  1. Niaspan 1000 mg one daily.  2. Levaquin 750 mg daily.  3. Bumetanide 1 mg daily.  4. Zetia 10 mg daily.  5. Amlodipine 5 mg daily.  6. Metoprolol 50 mg daily.  7. Aspirin 81 mg daily.  8. Prilosec 20 mg daily.  9. Calcium 500 mg daily.  10.Multiple vitamin one daily.  11.Promethazine half of a 25-mg tablet every 4 hours as needed.  12.Oxycodone/APAP 10/325 every 4 hours p.r.n.  13.Furosemide 40 mg daily.  14.Kay Ciel 20 mEq daily.  15.Prednisone 10 mg t.i.d. for 2 days and then gradual reduce it to      one b.i.d.  16.Levaquin 500 mg daily instead of the 750 mg daily.  17.Darvocet-N 100 every 4 hours p.r.n. for pain.   CONDITION ON DISCHARGE:  The patient was stable at the time of her  discharge.      [name], MD  Electronically Signed     AGM/MEDQ  D:  04/22/2007  T:  04/22/2007  Job:  [id number]

## 2010-11-28 NOTE — Group Therapy Note (Signed)
NAME:  Stefanie [name], Stefanie [name]                ACCOUNT NO.:  [id number]   MEDICAL RECORD NO.:  [id number]          PATIENT TYPE:  INP   LOCATION:  A340                          FACILITY:  [facility]   PHYSICIAN:  [name], MD   DATE OF BIRTH:  1932/10/14   DATE OF PROCEDURE:  03/24/2007  DATE OF DISCHARGE:                                 PROGRESS NOTE   SUBJECTIVE:  This patient was admitted with left lower quadrant pain and  nausea and vomiting.  She has not tolerated the morphine continues to  have some abdominal pain.  She remains on IV Levaquin and IV Unasyn.   OBJECTIVE:  VITAL SIGNS:  Blood pressure 108/35, respiration 20, pulse  97, temperature 98.6.  Laboratory studies show a CBC with WBC 13,600,  hemoglobin 14.2, hematocrit 42.6.  Chemistries showed slightly low serum  potassium at 3.4, glucose 145.  HEART:  Regular rhythm.  LUNGS:  Clear to P&A.  ABDOMEN:  Tenderness in left lower quadrant of abdomen.   ASSESSMENT:  The patient was admitted with nausea, vomiting, abdominal  pain in left lower quadrant.  She is thought to have flare of  diverticulitis.   PLAN:  Obtain GI consult.  Continue current IV fluids and IV antibiotic.      [name], MD  Electronically Signed     AGM/MEDQ  D:  03/24/2007  T:  03/24/2007  Job:  [id number]

## 2010-11-28 NOTE — Procedures (Signed)
NAMEARAYA, Stefanie [name]                ACCOUNT NO.:  [id number]   MEDICAL RECORD NO.:  [id number]          PATIENT TYPE:  INP   LOCATION:  A220                          FACILITY:  [facility]   PHYSICIAN:  Gerrit Friends. Dietrich Pates, MD, FACCDATE OF BIRTH:  [birth year]   DATE OF PROCEDURE:  04/01/2007  DATE OF DISCHARGE:                                ECHOCARDIOGRAM   REFERRING PHYSICIAN:  Angus G. McInnis, MD/Robert M. Dietrich Pates, MD, [facility]   CLINICAL DATA:  A 77 year old woman with congestive heart failure.   M-MODE TRACINGS:  Aorta 3.1, left atrium 3.5, septum 1.1, posterior wall  1.2, LV diastole 5.2, LV systole 3.9.   FINDINGS:  1. Technically suboptimal, but adequate echocardiographic study.  2. Normal left atrium, right atrium and right ventricle.  3. Normal proximal ascending aorta.  4. Normal aortic and mitral valves.  5. Tricuspid valve not well seen, but is grossly normal; physiologic      regurgitation.  Inadequate visualization of the pulmonic valve and      proximal pulmonary artery.  6. Mild left ventricular dilatation; no hypertrophy; normal regional      and global function.  7. Normal IVC.  8. Comparison with prior study of October 07, 2003, shows there has been      interval left ventricular dilatation.      Gerrit Friends. Dietrich Pates, MD, [facility]  Electronically Signed     RMR/MEDQ  D:  04/02/2007  T:  04/03/2007  Job:  [id number]

## 2010-11-28 NOTE — Group Therapy Note (Signed)
NAME:  Stefanie [name], DOOLEY                ACCOUNT NO.:  [id number]   MEDICAL RECORD NO.:  [id number]          PATIENT TYPE:  INP   LOCATION:  A340                          FACILITY:  [facility]   PHYSICIAN:  [name], MD   DATE OF BIRTH:  11/02/1932   DATE OF PROCEDURE:  03/28/2007  DATE OF DISCHARGE:                                 PROGRESS NOTE   This patient continues to feel some better.  She has had less abdominal  pain and less dyspnea.  She does have evidence of diverticulitis.  CT of  the abdomen showed sigmoid diverticulitis, contained perforation of  sigmoid colon, occasional episodes of nausea.   OBJECTIVE:  VITAL SIGNS:  Blood pressure 123/91, respirations 24, pulse  84, temperature 98.1.  LUNGS:  Occasional rales, no rhonchi. Marland Kitchen  HEART:  Regular rhythm.  ABDOMEN:  Slight tenderness over left lower quadrant of abdomen.   ASSESSMENT:  The patient does have diverticulitis with contained  perforation of sigmoid colon.  Does have a history of bronchitis, prior  history of asthma, hypertension.   PLAN:  To continue current IV antibiotics.  Will attempt to get patient  discharged either today or tomorrow.      [name], MD  Electronically Signed     AGM/MEDQ  D:  03/28/2007  T:  03/28/2007  Job:  [id number] [id number]

## 2010-11-28 NOTE — Group Therapy Note (Signed)
NAME:  Stefanie [name], Stefanie [name]                ACCOUNT NO.:  [id number]   MEDICAL RECORD NO.:  [id number]          PATIENT TYPE:  INP   LOCATION:  A220                          FACILITY:  [facility]   PHYSICIAN:  [name], MD   DATE OF BIRTH:  1933-04-17   DATE OF PROCEDURE:  04/01/2007  DATE OF DISCHARGE:                                 PROGRESS NOTE   This patient continues to feel better.  She does have recent bout of  diverticulitis.  CT of the abdomen showed sigmoid diverticulitis,  contained perforation of sigmoid colon.  She did have evidence of  congestive heart failure with fluid overload and was started on Lasix  and it has improved.  Her cardiac enzymes have remained normal, she did  have flare of gout involving her right foot with elevated uric acid.   OBJECTIVE:  VITAL SIGNS:  Blood pressure 116/63, respiration 24, pulse  84, temperature 99.1, O2 sats remain between 90 and 96.  Chemistries  remain relatively normal with some exception of slight elevation of  glucose 123.  HEART:  Regular rhythm.  LUNGS:  Clear to P&A.  The patient has slight redness, tenderness over the dorsum of her right  foot   ASSESSMENT:  The patient has above-stated problems and flare of gout  with hyperuricemia.   PLAN:  To continue current regimen, will step-down dosage of prednisone.      [name], MD  Electronically Signed     AGM/MEDQ  D:  04/01/2007  T:  04/01/2007  Job:  [id number]

## 2010-11-28 NOTE — Discharge Summary (Signed)
NAMETIFFINEY, SPARROW                ACCOUNT NO.:  [id number]   MEDICAL RECORD NO.:  [id number]          PATIENT TYPE:  INP   LOCATION:  A336                          FACILITY:  [facility]   PHYSICIAN:  Catalina Pizza, M.D.        DATE OF BIRTH:  06/30/1933   DATE OF ADMISSION:  07/27/2008  DATE OF DISCHARGE:  01/14/2010LH                               DISCHARGE SUMMARY   DISCHARGE DIAGNOSES:  1. Bronchitis.  2. Morbid obesity.  3. Dysphagia, difficulty swallowing question esophageal dysmotility.  4. Nausea.  5. Chest tightness.  6. Hyperglycemia, possible secondary to prediabetes and steroid      related.   DISCHARGE MEDICATIONS:  1. Mucinex 600 mg every 12 hours.  2. Prednisone taper 60, 50, 40, 30, 20, 10, then 10 mg for 1 week.  3. Metoprolol 50 mg b.i.d.  4. Potassium chloride 20 mEq p.o. daily.  5. Bumex 1 mg daily.  6. Trilipix 135 p.o. daily.  7. Fexofenadine hydrochloride 180 mg once daily.  8. Aspirin 81 mg daily.  9. Multivitamin once daily.  10.Calcium 500 plus vitamin D once daily.  11.Omeprazole 20 mEq once daily.  12.Albuterol neb as needed for shortness of breath and wheezing.   BRIEF HISTORY OF PRESENT ILLNESS:  [name] is a 77 year old morbidly  obese white female who has been having bronchitis for approximately last  3 weeks and noted that she was having more chest tightness and called  Dr. Marvel Plan office to set up an appointment and initially she thought  it was related to her heart and was told to come to the emergency  department for further assessment.  She came to the emergency department  and initial workup was negative, but given her risk factors and  difficulty with her breathing, emergency room physician felt more  prudent for her to come into hospital.  She noted that after getting  breathing treatment and Solu-Medrol, her chest tightness immediately  improved.  She also noted that she is having difficulty with swallowing  pills occasionally and  difficulty with swallowing food, feels like  things are getting stuck in her chest and not going down all the way.  She has had issues with this before and will be addressed below.   PHYSICAL EXAMINATION AT TIME OF DISCHARGE:  VITAL SIGNS:  Temperature is  98.7, blood pressure 123/56, pulse 77, respirations 18, sating 92-97% on  room air.  GENERAL:  This is a morbidly obese white female lying in bed in no acute  distress.  HEENT:  Unremarkable.  LUNGS:  Good air movement throughout.  Not appreciating wheezing today.  No rhonchi.  HEART:  Regular rate and rhythm.  No murmurs, gallops, or rubs.  ABDOMEN:  Soft, nontender, nondistended, positive bowel sounds.  EXTREMITIES:  Trace lower extremity edema bilaterally which is chronic.  NEUROLOGIC:  Alert and oriented x3.  Moving all extremities.  Does have  generalized weakness and use a walker chronically.  This is related  likely to her obesity and immobility from ankle and knee surgeries  before.   LABORATORY DATA OBTAINED  DURING HOSPITALIZATION:  CBC shows white count  of 11.0, hemoglobin 13.8, platelet count of 332.  CBC at day before  discharge showed white count 11.4, hemoglobin 12.7, platelet count of  291 with significant left shift.  Initial BMET shows sodium 140,  potassium 3.8, chloride 102, CO2 27, glucose 105, BUN 34, creatinine  1.30, calcium of 8.6.  BMET at time of discharge show sodium 140,  potassium 4.1, chloride 108, CO2 27, glucose 223, BUN 26, creatinine  1.10, calcium of 8.1.  BNP was 61.3.  CK was 238, 217, and 194,  respectively.  CK-MB was 14.2, 11.3, and 9.6 respectively, and troponin  I was 0.03, 0.05, and 0.03 respectively.   Chest x-ray obtained showed cardiomegaly, no definitive acute process.   HOSPITAL COURSE:  1. Bronchitis/upper respiratory infection.  She was started on IV Solu-      Medrol and was not started on any antibiotics.  She had a full 2-      week course and has been afebrile.  A dramatic   improvement with      her breathing and will continue with breathing treatments.  She is      usually in the outpatient.  We will start her on the steroid taper      and continue for another 2 weeks.  2. Morbid obesity.  Discussed availability or possibility for weight      loss, but it is very difficult given her immobility.  Her weight is      approximately 277 pounds when coming into the hospital.  3. Dysphagia.  Difficult to swallow and some mild nausea.  It is      unclear whether she has some irritation in her esophagus and has      had issues like this before requiring stretching by Dr. Karilyn Cota and      will need to get an appointment with him in the near future and      work with this and possibly have another EGD done to further assess      as she states it has been least 2 years since this has been      evaluated.  Continue the omeprazole for now.  4. Elevated blood sugars.  She has had significant elevation in her      blood sugars.  Of course, she has a significant history of diabetes      and with her morbid obesity has an increased risk for diabetes.      She has never officially been diagnosed with diabetes, has always      had normal A1c, but has propensity to go up, especially with      steroids, and we will need to limit this in her diet, and we will      recheck her sugars and possible A1c in 2 weeks to see exactly if      she has diabetes as an outpatient where possibilities that she may      have.   DISPOSITION:  The patient will be discharged to home with a followup  appointment with me in 2 weeks for routine evaluation anywhere on  August 13, 2008.  She has an appointment Dr. Dietrich Pates in another 2 days  for  further assessment of her heart which she did have some anxiety about  that she had fat around her heart because she had seen this on Dr. Tenny Craw,  and  her fear about this as well as congestive heart  failure.  So, we  will address that further with Dr.  Dietrich Pates.  She needs to get a further  appointment with Dr. Karilyn Cota as well.       Catalina Pizza, M.D.  Electronically Signed     ZH/MEDQ  D:  07/29/2008  T:  07/30/2008  Job:  [id number]   cc:   Lionel December, M.D.  Fax: [phone]   Gerrit Friends. Dietrich Pates, MD, [facility]  [address]  [place], [place] [address]

## 2010-11-28 NOTE — Group Therapy Note (Signed)
NAMEABENI, FINCHUM                ACCOUNT NO.:  [id number]   MEDICAL RECORD NO.:  [id number]          PATIENT TYPE:  INP   LOCATION:  A210                          FACILITY:  [facility]   PHYSICIAN:  Catalina Pizza, M.D.        DATE OF BIRTH:  [birth year]   DATE OF PROCEDURE:  DATE OF DISCHARGE:                                 PROGRESS NOTE   SUBJECTIVE:  Reviewed history and physical from Dr. Janna Arch.  This is a  77 year old morbidly obese white female who was in the hospital for  approximately 10 days, due to diverticulitis, was seen and evaluated at  that time by cardiology and was sent home approximately 10 days ago for  further antibiotic treatment but is awaiting eventual partial colectomy,  due to the diverticulitis.  She apparently had an episode of chest pain  with some diaphoresis and nausea and vomiting and cold and clammy-type  sensation, questionable whether she had some syncope; however, noted in  the history and physical did not.  Per the family, she felt like she may  have.  Was brought in for further evaluation and rule out of her heart  status.  She states she continues to have some pain which is palpable  and point-type tenderness to her left chest.  She continues to have some  nausea but minimal vomiting recently.  She states that she feels much  better, as far as her abdominal pain is concerned.  She definitely has  anxiety related to having the colectomy performed, which unclear exactly  which doctor and where she was to have this done.   OBJECTIVE:  VITAL SIGNS:  Temperature is 97.9, blood pressure is 98/52.  O2 sats 98% on 2 liters of oxygen.  GENERAL:  This is an obese white female lying in bed, in no acute  distress, smiling and conversant.  HEENT:  Unremarkable.  Pupils equal, round and reactive to light and  accommodation.  Oropharynx is clear.  Mucous membranes are moist.  NECK:  Shows no JVD, no carotid bruits.  LUNGS:  Clear to auscultation bilaterally.  No  rhonchi or wheezing.  HEART:  Regular rate and rhythm.  No murmurs, gallops or rubs.  ABDOMEN:  Obese, soft in nature with some mild left lower quadrant  tenderness, but no significant rebound or guarding, positive bowel  sounds.  EXTREMITIES:  Trace lower extremity edema bilaterally.  NEUROLOGIC:  Cranial nerves 2-12 intact, no deficits.  Vision and  strength and cranial nerves all intact.  MUSCULOSKELETAL:  Has point tenderness in the left sternal area, which  she states exacerbates the pain in her chest.  She states she also has  had some in her right chest as well.   Blood cultures x2 are still pending.  Cardiac panel this morning shows  CK of 100.3, CK-MB of 5.4, troponin-I of 0.03.  EKG essentially shows  normal sinus rhythm, with no specific ST wave changes, but no signs of  acute ischemia.   IMPRESSION:  This is a 77 year old morbidly obese white female who  presented with chest pain, which  appears to be musculoskeletal in nature  but has multiple risk factors for this, essentially, still getting ruled  out for cardiac ischemia, also awaiting elective colectomy due to her  diverticulitis.   ASSESSMENT/PLAN:  1. Chest pain.  She has had mildly elevated MB, but troponin-I is      normal and has not exhibited any specific signs of diaphoresis or      irregular heart rate on telemetry.  She did see Dr. Dietrich Pates during      her last hospitalization and has an appointment with him on      Tuesday.  Will continue to monitor her cardiac panels and repeat      EKG in the morning.  2. Nausea/diverticulitis.  She is to continue on the Levaquin, as      previously prescribed and will cover nausea with Zofran as needed.  3. Anxiety.  She does have significant anxiety related to possible      elective colectomy.  Did discuss in depth with her daughters that      she wished to go to [facility] for this and will attempt to get      that set up after hospitalization, to get with Wenda Low  if      possible, who was highly recommended by Dr. Karilyn Cota and agree with      his suggestion, given her morbid obesity.  4. Hypertension/hypotension.  She has low blood pressure at this time.      Will hold her Norvasc and will continue with the metoprolol for      heart prophylaxis, and continue to monitor with some low fluids, to      see if increases her blood pressure.  5. Will follow up with patient and continue on telemetry, and if no      other further signs of heart problems, will discharge tomorrow with      followup with Dr. Dietrich Pates on Tuesday, to see if pre-op clearance      needed, as far as her heart status is concerned.  She has not had      any significant heart issues before, but has multiple problems      which would increase her risk for this.      Catalina Pizza, M.D.  Electronically Signed     ZH/MEDQ  D:  04/12/2007  T:  04/12/2007  Job:  [id number]

## 2010-11-28 NOTE — Group Therapy Note (Signed)
NAME:  Stefanie [name], Stefanie [name]                ACCOUNT NO.:  [id number]   MEDICAL RECORD NO.:  [id number]           PATIENT TYPE:  INP   LOCATION:  A340                          FACILITY:  [facility]   PHYSICIAN:  [name], MD   DATE OF BIRTH:  Jun 28, 1933   DATE OF PROCEDURE:  DATE OF DISCHARGE:                                 PROGRESS NOTE   This patient continues to feel some better, but still has problems with  shortness of breath and wheezing.  Does have evidence of diverticulitis.  CT of the abdomen showed evidence of sigmoid diverticulitis with  contained perforation of sigmoid mesocolon.   OBJECTIVE/PHYSICAL EXAMINATION:  VITAL SIGNS:  Blood pressure 119/63,  respirations 24, temperature 97.8.  LUNGS:  Diminished breath sounds, occasional rhonchus.  HEART:  Regular rhythm.  ABDOMEN:  Slight tenderness to the left lower quadrant.   LABORATORY DATA:  Recent laboratory studies.  CBC:  WBC 7100 with  hemoglobin 11.6, hematocrit 34.9.   ASSESSMENT:  The patient has diverticulitis with contained perforation  of sigmoid mesocolon.  Does have history of bronchitis, chronic history  of asthma and hypertension.   PLAN:  To continue current IV antibiotics.  Reduce dose of Norvasc 5 mg  daily.  Continue regimen.      [name], MD  Electronically Signed     AGM/MEDQ  D:  03/27/2007  T:  03/27/2007  Job:  [id number]

## 2010-11-28 NOTE — Group Therapy Note (Signed)
Stefanie [name], Stefanie [name]                ACCOUNT NO.:  [id number]   MEDICAL RECORD NO.:  [id number]          PATIENT TYPE:  INP   LOCATION:  A336                          FACILITY:  [facility]   PHYSICIAN:  Catalina Pizza, M.D.        DATE OF BIRTH:  [birth year]   DATE OF PROCEDURE:  DATE OF DISCHARGE:                                 PROGRESS NOTE   SUBJECTIVE:  Stefanie [name] is a 77 year old morbidly obese white female  who presented with 3 weeks of bronchitis and shortness of breath with  chest tightness.  Overall, the patient states she is doing much better  from that standpoint, is eating and drinking well.  She did have  elevation in her blood sugar today, related to steroids which may have  helped significantly with her breathing today.  She denies any problems  with appetite, able to get up to bedside commode with some assistance.  Still notes that she feels like sometimes when she swallows, she has  some difficulty and still feels like she has to belch but cannot.   PHYSICAL EXAMINATION:  VITAL SIGNS:  T-max of 99.2, blood pressure is  107/51, T-current is 97.2, pulse 87, respirations 20, sating 97% on 1 L  of oxygen.  GENERAL:  This is a morbidly obese white female lying in bed in no acute  distress.  HEENT:  Unremarkable.  LUNGS:  No wheezing appreciated.  Good air movement throughout.  No  rhonchi or wheezing, just got a breathing treatment.  HEART:  Regular  rate and rhythm.  No murmurs, gallops, or rubs.  ABDOMEN:  Soft, protuberant, soft.  Positive bowel sounds.  EXTREMITIES:  Trace lower extremity edema bilaterally is chronic.  NEUROLOGIC:  Alert and oriented x3.  Moving all extremities.  Does have  some generalized weakness and use a walker at baseline at home.   LABORATORY DATA:  Cardiac panel today shows CK of 194, MB of 9.65,  troponin I of 0.03.  CBC shows white count 11.4, hemoglobin of 12.7,  platelet count 291.  BMET shows sodium 140, potassium 4.1, chloride 108,  CO2 27,  glucose 223, BUN 26, creatinine 1.10, calcium of 8.1.   IMPRESSION:  This is a 77 year old white female with a continued  bronchitis, improving currently.   ASSESSMENT/PLAN:  1. Bronchitis.  Started on IV Solu-Medrol and nebulizer treatments      which she does have a nebulizer at home and has been using      routinely.  I believe IV steroids has dramatically helped and we      will start her on a prednisone taper tomorrow, has been afebrile so      I do not feel need to add any more antibiotics given the fact that      she had had 2-week course of Levaquin.  2. Morbid obesity.  Discussed further with the patient that her weight      I believe is some where in the range of 277 pounds.  It is very      difficulty for her to get around  and weight loss will be very      difficult given her immobility status.  3. Dysphagia/difficulty with swallowing and some nausea.  She very      well may have some of the esophageal dysmotility and has required      stretching before.  She is to continue on omeprazole for now but      will need to get a followup appointment with Dr. Loreta Ave as an      outpatient, question whether or not need to have another EGD done,      she states it has been several years since this has been done.   DISPOSITION:  Given the fact that the patient is doing much better, we  will likely be able to discharge the patient tomorrow morning with  steroid taper and will follow up with me in approximately 2 weeks.  We  will see given the morning and reevaluate.      Catalina Pizza, M.D.  Electronically Signed     ZH/MEDQ  D:  07/28/2008  T:  07/29/2008  Job:  [id number]

## 2010-11-28 NOTE — Group Therapy Note (Signed)
NAME:  Stefanie [name], Stefanie [name]                ACCOUNT NO.:  [id number]   MEDICAL RECORD NO.:  [id number]          PATIENT TYPE:  INP   LOCATION:  A220                          FACILITY:  [facility]   PHYSICIAN:  [name], MD   DATE OF BIRTH:  [birth year]   DATE OF PROCEDURE:  DATE OF DISCHARGE:                                 PROGRESS NOTE   This patient continues to feel some better.  She continues to have some  abdominal pain, but characterized more as a soreness now.  Does have  evidence of diverticulitis.  CT of the abdomen showed sigmoid  diverticulitis and contained perforation of the sigmoid colon.  Patient  did have fluid overload and has responded to diuretics.  She also  complains of the soreness and pain, dorsum of the right foot.  She did  have elevated uric acids.  Uric acid was 9.  The patient was started on  prednisone taper.   OBJECTIVE:  VITAL SIGNS:  Blood pressure 108/62, respirations 16, pulse  83, temp 97.8, O2 sat is 90% to 94%.  LUNGS:  Diminished breath sounds in the lower lungs.  HEART:  Regular rhythm.  Patient does have some edema bilaterally.  ABDOMEN:  Obese.  Slight tenderness in the left lower quadrant.  EXTREMITIES:  Minimal ankle edema.  The patient has some soreness and  pain to right hip and left foot.   ASSESSMENT:  1. The patient has gout in the left foot with elevated uric acid.  2. The patient has sigmoid diverticulitis with contained perforation      of the sigmoid colon, improving.  3. Does have recent problem with fluid overload and congestive heart      failure.   PLAN:  Continue regimen.  Continue with prednisone.  Will need anti-  inflammatory following this.  Also, continue her IV antibiotics, Lasix.      [name], MD  Electronically Signed     AGM/MEDQ  D:  03/31/2007  T:  03/31/2007  Job:  [id number]
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
## 2010-11-28 NOTE — Letter (Signed)
Dec 03, 2006    [name], MD  [address]  [place], [place] [address]   RE:  EUFELIA, VENO  MRN:  [id number]  /  DOB:  14-Jul-1933   Dear Thalia Party:   Ms. Selke returns to the office for continued assessment and treatment  of chest discomfort and cardiovascular risk factors.  Since her last  visit, she has done quite well.  She reports occasional episodes of mild  chest heaviness that typically occur when she retires to bed.  These  pass spontaneously over a number of minutes.  She has had some changes  in her medications required by her insurance.  She was evaluated at the  breast center and felt to have some fibrotic changes in the right breast  that were insignificant.   CURRENT MEDICATIONS:  1. Bumetanide 1 mg daily.  2. Prilosec 20 mg daily.  3. Aspirin 81 mg daily.  4. Citrucel one scoop daily.  5. Toprol 50 mg daily.  6. Calcium with vitamin D daily.  7. Niaspan 1000 mg q.h.s.  8. Alendronate 70 mg each week.  9. Fish oil 1000 mg daily.  10.Amlodipine 10 mg daily.  11.Simvastatin 40 mg daily.  12.Colchicine 0.6 mg daily.  13.Ezetimibe 10 mg daily.   PHYSICAL EXAMINATION:  GENERAL:  A pleasant, overweight woman.  VITAL SIGNS:  The weight is 280, 7 pounds less than 6 months ago.  Blood  pressure 140/80, heart rate 75 and regular, respirations 16.  NECK:  No jugular venous distention; normal carotid upstrokes without  bruits.  LUNGS:  Clear.  CARDIAC:  Distant first and second heart sounds.  BREASTS:  Slightly indurated area over the left upper inner quadrant.  ABDOMEN:  Soft and nontender; no organomegaly.  EXTREMITIES:  Compression stockings in place without edema.   IMPRESSION:  Ms. Ardila is doing beautifully.  I will leave the  monitoring of laboratory values to your discretion.  Vaccinations are up  to date.  I will plan to see this nice woman again in 1 year.    Sincerely,      Gerrit Friends. Dietrich Pates, MD, [facility]  Electronically Signed    RMR/MedQ  DD: 12/03/2006  DT: 12/03/2006  Job #: [id number]

## 2010-11-28 NOTE — Letter (Signed)
September 27, 2008    Catalina Pizza, MD  [address] [place],  [place] [address]   RE:  SHAINDEL, SWEETEN  MRN:  [id number]  /  DOB:  1933-02-13   Dear Ian Malkin,   As we discussed, it was my pleasure to reevaluate Ms. Giammona in the  office today.  She reports a vast improvement in dyspnea and exercise  tolerance.  She did have her sleep study, which showed severe sleep  apnea.  Apparently, this had been noted on a prior study that was not  available to me.  She was provided with a positive pressure mask during  the latter portion of the night with good benefit and good tolerance.  PFTs were apparently not obtained as requested.   Current medications are unchanged from her last visit except for the  addition of Symbicort 2 puffs b.i.d. at a dose of 160/4.5.   PHYSICAL EXAMINATION:  GENERAL:  Pleasant overweight woman in no acute  distress.  VITAL SIGNS:  The weight is 273, 9 pounds less than 2 weeks ago.  Blood  pressure 145/75, heart rate 95 and regular, respirations 12 and  unlabored.  NECK:  No jugular venous distention; normal carotid upstrokes without  bruits.  LUNGS:  Clear with decreased breath sounds at the bases.  CARDIAC:  Distant first and second heart sounds.  PMI not palpable.  ABDOMEN:  Soft and nontender; no organomegaly.  EXTREMITIES:  Trace edema.   IMPRESSION:  Ms. Entwistle ha0s substantially improved with medication for  asthma.  She may well obtain good benefit from nocturnal use of positive  pressure ventilation by mask.  As we discussed, I will leave that to  your discretion.  She does have moderate hyperlipidemia that has  not been adequately treated due to prior adverse reactions to statins.  We will start WelChol 2 tablets t.i.d. and recheck a lipid profile in 4  weeks.  I suspect she will need a statin, perhaps at an every other day  dosage.  I will reassess this nice woman again in 2 months.  Thank you  for allowing me to share in her care.     Sincerely,      Gerrit Friends. Dietrich Pates, MD, [facility]  Electronically Signed    RMR/MedQ  DD: 09/27/2008  DT: 09/28/2008  Job #: [id number]

## 2010-11-28 NOTE — Op Note (Signed)
NAME:  Stefanie [name], Stefanie [name]                ACCOUNT NO.:  [id number]   MEDICAL RECORD NO.:  [id number]          PATIENT TYPE:  AMB   LOCATION:  DAY                           FACILITY:  [facility]   PHYSICIAN:  Lionel December, M.D.    DATE OF BIRTH:  11/08/1932   DATE OF PROCEDURE:  09/02/2008  DATE OF DISCHARGE:                                PROCEDURE NOTE   PROCEDURE:  Esophagogastroduodenoscopy with esophageal dilation.   INDICATION:  [name] is a 77 year old Caucasian female with multiple  medical problems, who has dysphagia primarily started about 2 months ago  since she has been treated for bronchitis.  She states her heartburn is  rather controlled; however, she has recurrent nausea, but no vomiting.  Procedure and risks were reviewed with the patient and informed consent  was obtained.  Please note the patient's last EGD was in 2003 when she  had few small gastric polyps and her esophagus was dilated.   Procedure risks were reviewed with the patient and informed consent was  obtained.   MEDICATIONS FOR CONSCIOUS SEDATION:  Exactacain spray for oropharyngeal  topical anesthesia, Demerol 25 mg IV, and Versed 4 mg IV.   FINDINGS:  Procedure performed in Endoscopy Suite.  The patient's vital  signs and O2 sats were monitored during the procedure and remained  stable.  The patient was placed in left lateral recumbent position and  Pentax videoscope was passed through oropharynx without any difficulty  into esophagus.   Esophageal mucosa of the esophagus was normal.  GE junction was located  at 40 cm from the incisors and was unremarkable without ring or  stricture formation.  No hernia was noted.   Stomach, it was emptied and distended very well with insufflation.  Folds of the proximal stomach were normal.  Multiple polyps are noted at  gastric body and fundus.  Some of these were at least 10 mm in diameter.  Three of the largest one were biopsied for routine histology on the way  out.   Pyloric channel was patent.  Angularis fundus and cardia were  examined by retroflexing the scope and were normal other than fundal  polyps.   Duodenal bulbar mucosa was normal.  Scope was passed into the second  part of the duodenal mucosa and folds were normal.   Endoscope was withdrawn.   Esophageal dilation was performed by passing 56-French Maloney dilator  to full insertion.  As the dilator was withdrawn, endoscope was passed  again and no mucosal disruption noted to the esophagus.  Three of the  gastric polyps were biopsied and endoscope was withdrawn.  The patient  tolerated the procedure well.   FINAL DIAGNOSES:  No evidence of erosive esophagitis, stricture, or ring  formation.  Esophagus dilated nevertheless given history of solid food  dysphagia by passing 56-French Maloney dilator.   Multiple gastric polyps primarily at gastric body and fundus, some of  these were at least 10 mm in diameter.  Three were biopsied for routine  histology.   Non-erosive antral gastritis.   RECOMMENDATIONS:  1. Antireflux measures reinforced.  2.  She will increase her Prilosec to 20 mg before breakfast and      evening meal.  3. We will check her H. pylori serologies today and I will be      contacting the patient with the results of the pending studies.   As far as the patient's nausea is concerned, I wonder if she has  gastroparesis secondary to one of her medications.       Lionel December, M.D.  Electronically Signed     NR/MEDQ  D:  09/02/2008  T:  09/02/2008  Job:  [id number]   cc:   Catalina Pizza, M.D.  Fax: [phone]

## 2010-11-28 NOTE — Group Therapy Note (Signed)
NAME:  Stefanie [name], Stefanie [name]                ACCOUNT NO.:  [id number]   MEDICAL RECORD NO.:  [id number]          PATIENT TYPE:  INP   LOCATION:  A340                          FACILITY:  [facility]   PHYSICIAN:  [name], MD   DATE OF BIRTH:  07/23/1932   DATE OF PROCEDURE:  DATE OF DISCHARGE:                                 PROGRESS NOTE   SUBJECTIVE:  This patient is feeling some better.  Did have some dyspnea  through the night, relieved with Xopenex.  She does have evidence of  diverticulitis.  CT of the abdomen showed sigmoid diverticulitis  containing perforation of sigmoid mesocolon.   OBJECTIVE:  VITAL SIGNS:  Blood pressure 105/48, respirations 22, pulse  74, temperature 97.7.  LUNGS:  Rhonchi and wheezes heard posteriorly.  HEART:  Regular rhythm.  ABDOMEN:  The patient does have some tenderness  in the left lower quadrant of abdomen.   ASSESSMENT:  The patient has diverticulitis which contains perforation  of sigmoid mesocolon.  She does have a history of bronchitis, prior  history of asthma, hypertension.   PLAN:  Continue current IV antibiotics.  Will reduce the dosage of  Norvasc to 5 mg daily.  Repeat CBC.      [name], MD  Electronically Signed     AGM/MEDQ  D:  03/26/2007  T:  03/26/2007  Job:  [id number]
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
## 2010-11-28 NOTE — Assessment & Plan Note (Signed)
[facility]                       [place] [facility]ARDIOLOG[facility] OFFICE NOTE   NAME:Stefanie [name], Stefanie [name]                       MRN:          [id number]  DATE:07/30/2008                            DOB:          [birth year]    CARDIOLOGIST:  Gerrit Friends. Dietrich Pates, MD, [facility]   PRIMARY CARE PHYSICIAN:  Catalina Pizza, MD   REASON FOR VISIT:  Annual followup.   HISTORY OF PRESENT ILLNESS:  Stefanie [name] is a 77 year old female patient  with a history of hypertension, hyperlipidemia, and paroxysmal atrial  fibrillation who was recently admitted at [facility] with acute  bronchitis.  The patient had significant amounts of chest tightness and  shortness of breath associated with this.  According to the data I have,  her troponins were negative and her BNP was 61.  Her chest x-ray showed  no acute process.  In the office, the patient is doing much better.  Her  wheezing is improving as well as her cough.  Her shortness breath is  also improving.  She sleeps on an incline chronically.  She denies any  paroxysmal nocturnal dyspnea.  Her lower extremity edema is stable.  She  continues to have right-sided chest pain from time to time.  This has  been occurring for years without significant change.  She did have a  Myoview study done in October 2008 that showed normal myocardial  perfusion and EF of 64%.   CURRENT MEDICATIONS:  Aspirin 81 mg daily, Metoprolol 50 mg b.i.d.,  Calcium, Multivitamin, Potassium 20 mEq daily, Prilosec daily, Bumex 1  mg daily, Trilipix 135 mg daily,  Fexofenadine 180 mg daily, MiraLax p.r.n., Ipratropium p.r.n.   PHYSICAL EXAMINATION:  GENERAL:  She is a well-nourished and well-  developed female in no distress.  VITAL SIGNS:  Blood pressure is 139/78, pulse 77, and weight 288 pounds.  HEENT:  Normal.  NECK:  Without JVD.  CARDIAC:  Normal S1 and S2.  Regular rate and rhythm.  LUNGS:  Clear to auscultation bilaterally.  No wheezing, rhonchi, or  rales.  ABDOMEN:  Soft and nontender.  EXTREMITIES:  1+ ankle edema bilaterally.  NEUROLOGIC:  She is alert and oriented x3.  Cranial nerves II through  XII are grossly intact.   ASSESSMENT AND PLAN:  1. Chest pain and shortness of breath.  The patient is recovering from      recent admission to [facility] with acute bronchitis.  She      is improving on treatment for her pulmonary process.  She requires      no further cardiac workup at this time.  2. Paroxysmal atrial fibrillation.  The patient has some palpitations      while she was taking prednisone prior to admission.  Her CHADS2      score is really 2 and possibly 3.  I do not see that she has had      atrial fibrillation documented in quite some time.  She has a      history of diastolic heart failure back in September 2008.  Her      left  ventricular function was normal at that time.  She also tells      me that she is a borderline diabetic, but is not taking any therapy      for this.  When she is seen in follow-up, if she has more      palpitations, we will likely need to set her up for a 21-day event      monitor to assess for arrhythmia.  3. Hypertension.  She has borderline control.  We will await any      medication adjustments for now.  She is still taking therapy for      her recent bronchitis.  4. Dyslipidemia.  This is followed by Dr. Margo Aye.   DISPOSITION:  Follow up with Dr. Dietrich Pates in 6 weeks or sooner p.r.n.      Tereso Newcomer, PA-C  Electronically Signed      Gerrit Friends. Dietrich Pates, MD, [facility]  Electronically Signed   SW/MedQ  DD: 07/30/2008  DT: 07/31/2008  Job #: [id number]   cc:   Catalina Pizza, M.D.
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
## 2010-12-01 NOTE — Letter (Signed)
May 13, 2006     [name], MD  [address]  [place], [place] [address]   RE:  Stefanie [name], Stefanie [name]  MRN:  [id number]  /  DOB:  1933-01-22   Dear Thalia Party:   Stefanie [name] returns to the office for continued assessment and treatment of  cardiovascular risk factors, including hyperlipidemia.  She started taking  aspirin prior to Niaspan with substantial improvement in flushing.  She has  minor residual symptoms, which do not concern her.  She requested a refill  of her prescription from the pharmacy, but was told that this could not be  done due to insurance reasons.  As a result, she has been trying to maintain  her supply of drug and has been taking 1000 mg only on an every other day  basis.   She continues to worry about the small mass in the medial aspect of the left  breast.  On palpation this is firm and mobile with a diameter of  approximately 1/2 - 1 cm.   Vital signs are good with a heart rate of 75, blood pressure 120/60, and  weight of 287, 5 pounds increased since 3 weeks ago.   IMPRESSION:  Stefanie [name] is doing well from a cardiac standpoint.  She will  continue Niaspan 1000 mg with a new prescription and return for a lipid  profile in 2 months and a return office visit in 7 months.   We had a long discussion about her breast issues.  I do not believe she has  any significant abnormalities that absolutely require further evaluation,  but she is quite anxious about the mass of which we are    aware and other suspicious areas of her breasts.  I agree with you that  consultation with an expert at the breast center is warranted.  She will  call for an appointment.    Sincerely,      Gerrit Friends. Dietrich Pates, MD, [facility]    RMR/MedQ  DD: 05/13/2006  DT: 05/13/2006  Job #: [id number]
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
## 2010-12-01 NOTE — Letter (Signed)
April 24, 2006    [name], MD  [address]  [place], [place] [address]   RE:  Stefanie [name], Stefanie [name]  MRN:  [id number]  /  DOB:  [birth year]   Dear Stefanie [name]:   Stefanie [name] returns to the office for continued assessment and treatment of  multiple cardiovascular risk factors, a history of paroxysmal atrial  fibrillation and chest discomfort.  Since I last saw her, she has been  stable.  She reports good control of blood pressure.  She has occasional  palpitations lasting no more than a matter of minutes.  She has occasional  bilateral neck discomfort that radiates into the upper chest.  She has full  mobility of her neck without any known DJD.  She reports mild to moderate  flushing with Niaspan such that she has discontinued this medication in the  past.  She was recently advised by your office to double the dose.   CURRENT MEDICATIONS:  1. Bumex 40 mg daily.  2. Prilosec 20 mg daily.  3. Amlodipine 5 mg daily.  4. Enteric coated aspirin 81 mg daily.  5. Toprol 50 mg daily.  6. Calcium and vitamin D daily.  7. Vytorin 10/40 mg daily.  8. Niaspan 500 mg daily.  9. Actonel 75 mg every 2 weeks.   PHYSICAL EXAMINATION:  GENERAL:  A pleasant woman in no acute distress who  walks with a walker.  VITAL SIGNS:  Weight 282, 3 pounds more than last year.  Blood pressure  110/75, heart rate 66 and regular, respirations 16.  NECK:  No jugular venous distention.  Normal carotid upstrokes without  bruits.  HEENT:  Normal lids and conjunctivae.  Normal oral mucosa.  ENDOCRINE:  No thyromegaly.  LUNGS:  Mild kyphosis, clear lung fields.  CARDIAC:  Distant S1 and S2.  ABDOMEN:  Soft and nontender, no masses, no organomegaly.  EXTREMITIES:  No edema   IMPRESSION:  Stefanie [name] is doing generally well.  Recent laboratory  obtained in your office was fairly good.  Due to a total cholesterol  slightly in excess of 200 and LDL slightly in excess of 295, changes in her  medications were  appropriately advised.  I recommended that she take a non-  enteric coated aspirin prior to her Niaspan.  If these relieves flushing,  she can increase the dose as advised.  If not, we will need to find a  different medication for her.  She seemed dubious about this advice.  I will  see her again in 2 weeks to be certain that she is receiving adequate lipid  lowering therapy.  Vaccinations are up-to-date.  She will present to your  office soon for this years influenza vaccine.    Sincerely,     Gerrit Friends. Dietrich Pates, MD, [facility]    RMR/MedQ  /  Job #:  [id number]  DD:  04/24/2006 / DT:  04/25/2006

## 2010-12-01 NOTE — Op Note (Signed)
NAME:  Stefanie [name], Stefanie [name]                ACCOUNT NO.:  [id number]   MEDICAL RECORD NO.:  [id number]          PATIENT TYPE:  AMB   LOCATION:  DAY                           FACILITY:  [facility]   PHYSICIAN:  Lionel December, M.D.    DATE OF BIRTH:  July 13, 1933   DATE OF PROCEDURE:  12/04/2005  DATE OF DISCHARGE:                                 OPERATIVE REPORT   PROCEDURE:  Colonoscopy with polypectomy and hemostasis of one polypectomy  site for post-polypectomy bleed.   INDICATIONS:  [name] is a 77 year old Caucasian female who has history of  colonic polyps and was recently treated for diverticulitis.  We decided to  bring her for colonoscopy that was scheduled because of recent  diverticulitis to make sure she does not have a neoplasm.  She has  completely responded to antibiotic therapy.   The procedure risks were reviewed the patient, informed consent was  obtained.   MEDICATIONS FOR CONSCIOUS SEDATION:  Demerol 50 mg IV Versed 6 mg IV.   FINDINGS:  Procedure performed in endoscopy suite.  The patient's vital  signs and O2 saturation were monitored during procedure and remained stable.  The patient was placed in the left lateral recumbent position and rectal  examination performed.  No abnormality noted on external or digital exam.  The Olympus video scope was placed in the rectum and advanced under vision  into sigmoid colon and beyond.  Preparation was satisfactory.  She had  scattered diverticula at sigmoid colon.  Scope was passed into the cecum,  which was identified by appendiceal orifice and ileocecal valve.  Pictures  taken for the record.  As the scope was withdrawn, colonic mucosa was  carefully examined.  There was a small polyp at transverse colon, which was  partly snared and partly coagulated.  There were three more polyps, all of  these were in the descending colon.  The most proximal one was about 5 mm.  There was snared and retrieved.  The one distal to it was about 6-7 mm  and  was also snared and retrieved.  The third polyp was about 8 x 12 mm.  This  was snared and there was a postpolypectomy bleed.  This was controlled with  application of three hemoclips.  Hemostasis was excellent.  The site was  watched for a few minutes and there was no recurrence of bleed.  Pictures  taken for the record.  This polyp was caught with a Dormia basket and  retrieved.  Endoscope was passed again and the mucosa of rest of the sigmoid  colon was normal.  Rectal mucosa similarly was normal.  Scope was  retroflexed to examine anorectal junction and small hemorrhoids were noted  below the dentate line.  Endoscope was straightened and withdrawn.  The  patient tolerated the procedure well.   FINAL DIAGNOSES:  1.  Sigmoid colon diverticulosis, moderate in number.  2.  Three polyps snared from descending colon.  Arterial bleeding noted from      one polypectomy site, controlled with application of three hemoclips.  3.  A fourth small polyp  of the transverse colon, partly snared and partly      coagulated.  4.  External hemorrhoids.   RECOMMENDATIONS:  1.  No aspirin for 10 days.  2.  I will be contacting the patient with biopsy results and further      recommendations.  3.  She will need to start a high-fiber diet but wait for three or four      days.      Lionel December, M.D.  Electronically Signed     NR/MEDQ  D:  12/04/2005  T:  12/04/2005  Job:  [id number]

## 2010-12-01 NOTE — Procedures (Signed)
NAME:  Stefanie [name], NOWOTNY                          ACCOUNT NO.:  [id number]   MEDICAL RECORD NO.:  [id number]                   PATIENT TYPE:  OUT   LOCATION:  RAD                                  FACILITY:  [facility]   PHYSICIAN:  Pevely Bing, M.D.               DATE OF BIRTH:  12-Feb-1933   DATE OF PROCEDURE:  DATE OF DISCHARGE:                                  ECHOCARDIOGRAM   REFERRING PHYSICIAN:  1. Dr. Ishmael Holter. McInnis.  2. Dr. [place] Bing.   CLINICAL DATA:  A 77 year old woman with palpitations and hypertension.   M-MODE:  1. Aorta 3.0.  2. Left atrium 3.5.  3. Septum 1.6.  4. Posterior wall 1.4.  5. LV diastole 4.0.  6. LV systole 3.3.   FINDINGS:  1. Technically suboptimal and somewhat limited echocardiographic study.  2. Normal left atrial size; normal right ventricular size and function.  3. Normal mitral and aortic valves.  4. Normal internal dimension of the left ventricle; mild, concentric left     ventricular hypertrophy.  There is copious pericardial fat - and     organized pericardial effusion can not be unequivocably excluded.  5. Left ventricular function was assessed with the assistance of an     injection of echocardiographic contrast.  No regional wall motion     abnormalities were appreciated.  Overall, LV systolic function appeared     to be normal.      ___________________________________________                                            [place] Bing, M.D.   RR/MEDQ  D:  10/07/2003  T:  10/07/2003  Job:  [id number]

## 2011-02-21 ENCOUNTER — Other Ambulatory Visit (HOSPITAL_COMMUNITY): Payer: Self-pay | Admitting: Internal Medicine

## 2011-02-21 DIAGNOSIS — Z139 Encounter for screening, unspecified: Secondary | ICD-10-CM

## 2011-04-03 ENCOUNTER — Ambulatory Visit (HOSPITAL_COMMUNITY)
Admission: RE | Admit: 2011-04-03 | Discharge: 2011-04-03 | Disposition: A | Discharge status: Home / Self Care | Payer: Medicare HMO | Source: Ambulatory Visit | Attending: Internal Medicine | Admitting: Internal Medicine

## 2011-04-03 ENCOUNTER — Ambulatory Visit (HOSPITAL_COMMUNITY): Admission: RE | Admit: 2011-04-03 | Payer: Medicare HMO | Source: Ambulatory Visit

## 2011-04-03 DIAGNOSIS — Z1231 Encounter for screening mammogram for malignant neoplasm of breast: Secondary | ICD-10-CM | POA: Insufficient documentation

## 2011-04-03 DIAGNOSIS — Z139 Encounter for screening, unspecified: Secondary | ICD-10-CM

## 2011-04-26 LAB — POCT CARDIAC MARKERS
CKMB, poc: 2.3
Myoglobin, poc: 259
Myoglobin, poc: 396

## 2011-04-26 LAB — CARDIAC PANEL(CRET KIN+CKTOT+MB+TROPI)
CK, MB: 5.1 — ABNORMAL HIGH
Relative Index: 5.2 — ABNORMAL HIGH
Relative Index: INVALID
Total CK: 103
Total CK: 86
Total CK: 90
Troponin I: 0.02
Troponin I: 0.03

## 2011-04-26 LAB — CBC
HCT: 35.1 — ABNORMAL LOW
Hemoglobin: 11.6 — ABNORMAL LOW
MCHC: 33
MCV: 88.1
RBC: 3.96
RBC: 4.43
RDW: 14.8 — ABNORMAL HIGH

## 2011-04-26 LAB — BASIC METABOLIC PANEL
BUN: 12
CO2: 28
CO2: 29
Chloride: 102
Creatinine, Ser: 1.35 — ABNORMAL HIGH
GFR calc Af Amer: 47 — ABNORMAL LOW
GFR calc non Af Amer: 44 — ABNORMAL LOW
Glucose, Bld: 146 — ABNORMAL HIGH
Glucose, Bld: 159 — ABNORMAL HIGH
Potassium: 3.6

## 2011-04-26 LAB — DIFFERENTIAL
Basophils Absolute: 0
Basophils Relative: 0
Eosinophils Absolute: 0.2
Eosinophils Relative: 2
Lymphocytes Relative: 10 — ABNORMAL LOW
Lymphs Abs: 1.4
Monocytes Absolute: 1.3 — ABNORMAL HIGH
Monocytes Absolute: 1.6 — ABNORMAL HIGH
Monocytes Relative: 10
Monocytes Relative: 9
Neutro Abs: 11.9 — ABNORMAL HIGH
Neutrophils Relative %: 79 — ABNORMAL HIGH

## 2011-04-26 LAB — CULTURE, BLOOD (ROUTINE X 2)

## 2011-04-27 LAB — CBC
HCT: 40.7
HCT: 42.6
Hemoglobin: 11.6 — ABNORMAL LOW
Hemoglobin: 11.9 — ABNORMAL LOW
Hemoglobin: 13.7
Hemoglobin: 14.2
MCHC: 32.9
MCV: 89.6
RBC: 3.87
RBC: 4.03
RDW: 14.3 — ABNORMAL HIGH
RDW: 14.5 — ABNORMAL HIGH
WBC: 12.5 — ABNORMAL HIGH
WBC: 13.6 — ABNORMAL HIGH
WBC: 7.1
WBC: 8.6

## 2011-04-27 LAB — COMPREHENSIVE METABOLIC PANEL
Alkaline Phosphatase: 93
BUN: 15
Chloride: 99
Creatinine, Ser: 0.87
GFR calc non Af Amer: >60
Glucose, Bld: 145 — ABNORMAL HIGH
Potassium: 3.4 — ABNORMAL LOW
Total Bilirubin: 1.4 — ABNORMAL HIGH

## 2011-04-27 LAB — URINALYSIS, ROUTINE W REFLEX MICROSCOPIC
Bilirubin Urine: NEGATIVE
Glucose, UA: NEGATIVE
Hgb urine dipstick: NEGATIVE
Ketones, ur: NEGATIVE
Nitrite: NEGATIVE
Specific Gravity, Urine: 1.025
pH: 5.5

## 2011-04-27 LAB — DIFFERENTIAL
Basophils Absolute: 0
Basophils Absolute: 0
Basophils Absolute: 0.1
Basophils Relative: 0
Lymphocytes Relative: 11 — ABNORMAL LOW
Lymphocytes Relative: 16
Lymphocytes Relative: 16
Lymphocytes Relative: 8 — ABNORMAL LOW
Lymphs Abs: 1.1
Lymphs Abs: 1.4
Lymphs Abs: 1.4
Monocytes Absolute: 0.7
Monocytes Absolute: 0.9 — ABNORMAL HIGH
Monocytes Absolute: 1.1 — ABNORMAL HIGH
Monocytes Relative: 10
Monocytes Relative: 12 — ABNORMAL HIGH
Neutro Abs: 11.8 — ABNORMAL HIGH
Neutro Abs: 5
Neutro Abs: 5.8
Neutro Abs: 9.9 — ABNORMAL HIGH
Neutrophils Relative %: 67
Neutrophils Relative %: 86 — ABNORMAL HIGH

## 2011-04-27 LAB — BASIC METABOLIC PANEL
CO2: 30
Calcium: 8.2 — ABNORMAL LOW
Calcium: 9.4
GFR calc Af Amer: >60
GFR calc non Af Amer: >60
GFR calc non Af Amer: >60
Glucose, Bld: 123 — ABNORMAL HIGH
Glucose, Bld: 124 — ABNORMAL HIGH
Glucose, Bld: 144 — ABNORMAL HIGH
Potassium: 3.6
Potassium: 3.7
Sodium: 140
Sodium: 140
Sodium: 140

## 2011-04-27 LAB — URINE MICROSCOPIC-ADD ON

## 2011-04-27 LAB — CARDIAC PANEL(CRET KIN+CKTOT+MB+TROPI)
CK, MB: 4.6 — ABNORMAL HIGH
CK, MB: 5.5 — ABNORMAL HIGH
Relative Index: 2.1
Total CK: 192 — ABNORMAL HIGH
Total CK: 241 — ABNORMAL HIGH

## 2011-04-27 LAB — CK TOTAL AND CKMB (NOT AT ARMC)
CK, MB: 8.3 — ABNORMAL HIGH
Relative Index: 2
Relative Index: 2.8 — ABNORMAL HIGH
Total CK: 293 — ABNORMAL HIGH

## 2011-06-16 DIAGNOSIS — R55 Syncope and collapse: Secondary | ICD-10-CM

## 2011-06-16 HISTORY — DX: Syncope and collapse: R55

## 2011-06-27 ENCOUNTER — Encounter: Payer: Self-pay | Admitting: Cardiology

## 2011-06-29 ENCOUNTER — Emergency Department (HOSPITAL_COMMUNITY): Payer: Medicare HMO

## 2011-06-29 ENCOUNTER — Encounter (HOSPITAL_COMMUNITY): Payer: Self-pay | Admitting: *Deleted

## 2011-06-29 ENCOUNTER — Other Ambulatory Visit: Payer: Self-pay

## 2011-06-29 ENCOUNTER — Observation Stay (HOSPITAL_COMMUNITY)
Admission: EM | Admit: 2011-06-29 | Discharge: 2011-06-30 | Disposition: A | Discharge status: Home / Self Care | Payer: Medicare HMO | Attending: Internal Medicine | Admitting: Internal Medicine

## 2011-06-29 DIAGNOSIS — I4891 Unspecified atrial fibrillation: Secondary | ICD-10-CM | POA: Insufficient documentation

## 2011-06-29 DIAGNOSIS — G4730 Sleep apnea, unspecified: Secondary | ICD-10-CM | POA: Insufficient documentation

## 2011-06-29 DIAGNOSIS — R55 Syncope and collapse: Secondary | ICD-10-CM | POA: Diagnosis present

## 2011-06-29 DIAGNOSIS — Z79899 Other long term (current) drug therapy: Secondary | ICD-10-CM | POA: Insufficient documentation

## 2011-06-29 DIAGNOSIS — E860 Dehydration: Secondary | ICD-10-CM | POA: Insufficient documentation

## 2011-06-29 DIAGNOSIS — R42 Dizziness and giddiness: Principal | ICD-10-CM | POA: Insufficient documentation

## 2011-06-29 DIAGNOSIS — I10 Essential (primary) hypertension: Secondary | ICD-10-CM | POA: Insufficient documentation

## 2011-06-29 LAB — COMPREHENSIVE METABOLIC PANEL
ALT: 24 U/L (ref 0–35)
Alkaline Phosphatase: 42 U/L (ref 39–117)
BUN: 43 mg/dL — ABNORMAL HIGH (ref 6–23)
CO2: 28 mEq/L (ref 19–32)
Calcium: 10.5 mg/dL (ref 8.4–10.5)
GFR calc Af Amer: 58 mL/min — ABNORMAL LOW (ref >90)
GFR calc non Af Amer: 50 mL/min — ABNORMAL LOW (ref >90)
Glucose, Bld: 105 mg/dL — ABNORMAL HIGH (ref 70–99)
Sodium: 137 mEq/L (ref 135–145)
Total Protein: 6.9 g/dL (ref 6.0–8.3)

## 2011-06-29 LAB — CBC
HCT: 42.1 % (ref 36.0–46.0)
Hemoglobin: 13.6 g/dL (ref 12.0–15.0)
MCH: 28.6 pg (ref 26.0–34.0)
MCV: 88.6 fL (ref 78.0–100.0)
Platelets: 286 10*3/uL (ref 150–400)
RBC: 4.75 MIL/uL (ref 3.87–5.11)
WBC: 9.7 10*3/uL (ref 4.0–10.5)

## 2011-06-29 LAB — DIFFERENTIAL
Eosinophils Absolute: 0.2 10*3/uL (ref 0.0–0.7)
Eosinophils Relative: 2 % (ref 0–5)
Lymphocytes Relative: 20 % (ref 12–46)
Lymphs Abs: 1.9 10*3/uL (ref 0.7–4.0)
Monocytes Relative: 9 % (ref 3–12)

## 2011-06-29 LAB — POCT I-STAT TROPONIN I: Troponin i, poc: 0.01 ng/mL (ref 0.00–0.08)

## 2011-06-29 LAB — GLUCOSE, CAPILLARY: Glucose-Capillary: 174 mg/dL — ABNORMAL HIGH (ref 70–99)

## 2011-06-29 MED ORDER — CALCIUM CARBONATE-VITAMIN D 500-200 MG-UNIT PO TABS
1.0000 | ORAL_TABLET | Freq: Two times a day (BID) | ORAL | Status: DC
  Administered 2011-06-29 – 2011-06-30 (×2): 1 via ORAL
  Filled 2011-06-29 (×2): qty 1

## 2011-06-29 MED ORDER — CALCIUM-VITAMIN D 250-125 MG-UNIT PO TABS
1.0000 | ORAL_TABLET | Freq: Two times a day (BID) | ORAL | Status: DC
  Filled 2011-06-29 (×6): qty 1

## 2011-06-29 MED ORDER — BUMETANIDE 1 MG PO TABS
1.0000 mg | ORAL_TABLET | Freq: Every day | ORAL | Status: DC
  Administered 2011-06-29 – 2011-06-30 (×2): 1 mg via ORAL
  Filled 2011-06-29 (×2): qty 1

## 2011-06-29 MED ORDER — LOSARTAN POTASSIUM 50 MG PO TABS
100.0000 mg | ORAL_TABLET | Freq: Every day | ORAL | Status: DC
  Administered 2011-06-30: 100 mg via ORAL
  Filled 2011-06-29: qty 2

## 2011-06-29 MED ORDER — METOPROLOL TARTRATE 25 MG PO TABS
25.0000 mg | ORAL_TABLET | Freq: Two times a day (BID) | ORAL | Status: DC
  Administered 2011-06-29 – 2011-06-30 (×2): 25 mg via ORAL
  Filled 2011-06-29 (×2): qty 1

## 2011-06-29 MED ORDER — PROMETHAZINE HCL 12.5 MG PO TABS: 12.5000 mg | ORAL_TABLET | Freq: Four times a day (QID) | ORAL | Status: DC | PRN

## 2011-06-29 MED ORDER — VITAMIN D3 25 MCG (1000 UNIT) PO TABS
1000.0000 [IU] | ORAL_TABLET | Freq: Every day | ORAL | Status: DC
  Administered 2011-06-30: 1000 [IU] via ORAL
  Filled 2011-06-29 (×4): qty 1

## 2011-06-29 MED ORDER — OMEGA-3 FATTY ACIDS 1000 MG PO CAPS
2.0000 g | ORAL_CAPSULE | Freq: Every day | ORAL | Status: DC
Start: 2011-06-29 — End: 2011-06-30
  Administered 2011-06-30: 2 g via ORAL
  Filled 2011-06-29 (×4): qty 2

## 2011-06-29 MED ORDER — POTASSIUM CHLORIDE 20 MEQ PO PACK
20.0000 meq | PACK | Freq: Every day | ORAL | Status: DC
  Administered 2011-06-30: 20 meq via ORAL
  Filled 2011-06-29 (×4): qty 1

## 2011-06-29 MED ORDER — PROMETHAZINE HCL 25 MG/ML IJ SOLN: 12.5000 mg | Freq: Four times a day (QID) | INTRAMUSCULAR | Status: DC | PRN

## 2011-06-29 MED ORDER — COLESEVELAM HCL 625 MG PO TABS
1250.0000 mg | ORAL_TABLET | Freq: Three times a day (TID) | ORAL | Status: DC
  Administered 2011-06-29 – 2011-06-30 (×3): 1250 mg via ORAL
  Filled 2011-06-29 (×9): qty 2

## 2011-06-29 MED ORDER — CALCIUM POLYCARBOPHIL 625 MG PO TABS
625.0000 mg | ORAL_TABLET | Freq: Every day | ORAL | Status: DC
  Administered 2011-06-29 – 2011-06-30 (×2): 625 mg via ORAL
  Filled 2011-06-29 (×4): qty 1

## 2011-06-29 MED ORDER — B-COMPLEX/B-12 PO TABS
1.0000 | ORAL_TABLET | Freq: Every day | ORAL | Status: DC
  Administered 2011-06-30: 1 via ORAL
  Filled 2011-06-29 (×4): qty 1

## 2011-06-29 MED ORDER — HEPARIN SODIUM (PORCINE) 5000 UNIT/ML IJ SOLN
5000.0000 [IU] | Freq: Three times a day (TID) | INTRAMUSCULAR | Status: DC
  Administered 2011-06-29 – 2011-06-30 (×2): 5000 [IU] via SUBCUTANEOUS
  Filled 2011-06-29 (×3): qty 1

## 2011-06-29 MED ORDER — PANTOPRAZOLE SODIUM 40 MG PO TBEC
40.0000 mg | DELAYED_RELEASE_TABLET | Freq: Every day | ORAL | Status: DC
  Administered 2011-06-30: 40 mg via ORAL
  Filled 2011-06-29: qty 1

## 2011-06-29 MED ORDER — ASPIRIN EC 81 MG PO TBEC
81.0000 mg | DELAYED_RELEASE_TABLET | Freq: Two times a day (BID) | ORAL | Status: DC
  Administered 2011-06-29 – 2011-06-30 (×2): 81 mg via ORAL
  Filled 2011-06-29 (×2): qty 1

## 2011-06-29 MED ORDER — FENOFIBRATE 160 MG PO TABS
160.0000 mg | ORAL_TABLET | Freq: Every day | ORAL | Status: DC
  Administered 2011-06-30: 160 mg via ORAL
  Filled 2011-06-29 (×4): qty 1

## 2011-06-29 MED ORDER — ACETAMINOPHEN 325 MG PO TABS
650.0000 mg | ORAL_TABLET | Freq: Four times a day (QID) | ORAL | Status: DC | PRN
  Filled 2011-06-29: qty 2

## 2011-06-29 MED ORDER — SODIUM CHLORIDE 0.9 % IV SOLN
INTRAVENOUS | Status: DC
  Administered 2011-06-30: 10:00:00 via INTRAVENOUS

## 2011-06-29 MED ORDER — ACETAMINOPHEN 650 MG RE SUPP: 650.0000 mg | Freq: Four times a day (QID) | RECTAL | Status: DC | PRN

## 2011-06-29 MED ORDER — SODIUM CHLORIDE 0.9 % IV SOLN
INTRAVENOUS | Status: AC
  Administered 2011-06-29: 16:00:00 via INTRAVENOUS

## 2011-06-29 MED ORDER — THERA M PLUS PO TABS
1.0000 | ORAL_TABLET | Freq: Every day | ORAL | Status: DC
  Administered 2011-06-30: 1 via ORAL
  Filled 2011-06-29: qty 1
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
## 2011-06-29 NOTE — ED Notes (Signed)
Helped to bedside commode. Back to bed and set back up on monitor.

## 2011-06-30 LAB — COMPREHENSIVE METABOLIC PANEL
ALT: 20 U/L (ref 0–35)
AST: 18 U/L (ref 0–37)
CO2: 28 mEq/L (ref 19–32)
Chloride: 106 mEq/L (ref 96–112)
Creatinine, Ser: 0.95 mg/dL (ref 0.50–1.10)
GFR calc non Af Amer: 57 mL/min — ABNORMAL LOW (ref >90)
Sodium: 141 mEq/L (ref 135–145)
Total Bilirubin: 0.3 mg/dL (ref 0.3–1.2)

## 2011-06-30 LAB — CBC
MCH: 29 pg (ref 26.0–34.0)
MCHC: 32.5 g/dL (ref 30.0–36.0)
Platelets: 281 10*3/uL (ref 150–400)
RDW: 14.5 % (ref 11.5–15.5)

## 2011-06-30 LAB — CARDIAC PANEL(CRET KIN+CKTOT+MB+TROPI): Total CK: 253 U/L — ABNORMAL HIGH (ref 7–177)

## 2011-06-30 MED ORDER — MECLIZINE HCL 25 MG PO TABS: 25.0000 mg | ORAL_TABLET | Freq: Three times a day (TID) | ORAL | Status: AC | PRN

## 2011-06-30 NOTE — Progress Notes (Signed)
CRITICAL VALUE ALERT  Critical value received:  CKMB 11.5  Date of notification:  06/30/2011  Time of notification:  01:00   Critical value read back:yes  Nurse who received alert:  Rhae Hammock, RN  MD notified (1st page):  Dr. Orvan Falconer  Time of first page:  01:08  MD notified (2nd page):  Time of second page:  Responding MD:  Dr. Orvan Falconer  Time MD responded:  01:10  Doctor said "subclinical" result.  No change in treatment since patient is asymptomatic.

## 2011-06-30 NOTE — Progress Notes (Signed)
Patient received discharge instructions along with follow up appointments and prescriptions. Patient verbalized understanding of all instructions. Patient was escorted by staff via wheelchair to vehicle. Patient discharged to home in stable condition. 

## 2011-06-30 NOTE — Discharge Summary (Signed)
Physician Discharge Summary  Patient ID: Stefanie [name] MRN: [id number] DOB/AGE: [birth year] 77 y.o. Primary Care Physician:HALL,ZACK, MD Admit date: 06/29/2011 Discharge date: 06/30/2011    Discharge Diagnoses:  1. Acute vertigo resulting in syncope. Etiology unclear. 2. Serial cardiac enzymes negative. 3. History of paroxysmal atrial fibrillation, currently in sinus rhythm. 4. Morbid obesity. 5. Sleep apnea, uses CPAP machine at home. 6. Hypertension, controlled.   Current Discharge Medication List    START taking these medications   Details  meclizine (ANTIVERT) 25 MG tablet Take 1 tablet (25 mg total) by mouth 3 (three) times daily as needed for dizziness. Qty: 30 tablet, Refills: 0      CONTINUE these medications which have NOT CHANGED   Details  aspirin 81 MG tablet Take 81 mg by mouth 2 (two) times daily.     B Complex Vitamins (B-COMPLEX/B-12) TABS Take 1 tablet by mouth daily.      bumetanide (BUMEX) 1 MG tablet Take 1 mg by mouth daily.      calcium-vitamin D (OSCAL) 250-125 MG-UNIT per tablet Take 1 tablet by mouth 2 (two) times daily.      cholecalciferol (VITAMIN D) 1000 UNITS tablet Take 1,000 Units by mouth daily.      Choline Fenofibrate (TRILIPIX) 135 MG capsule Take 135 mg by mouth daily.      colesevelam (WELCHOL) 625 MG tablet Take 1,250 mg by mouth 3 (three) times daily.      fish oil-omega-3 fatty acids 1000 MG capsule Take 2 g by mouth daily.      losartan (COZAAR) 100 MG tablet Take 100 mg by mouth daily.      metoprolol tartrate (LOPRESSOR) 25 MG tablet Take 25 mg by mouth 2 (two) times daily.      Multiple Vitamin (MULTIVITAMIN) tablet Take 1 tablet by mouth daily.      omeprazole (PRILOSEC) 20 MG capsule Take 20 mg by mouth daily.      polycarbophil (FIBERCON) 625 MG tablet Take 625 mg by mouth daily.      potassium chloride (KLOR-CON) 20 MEQ packet Take 20 mEq by mouth daily.        STOP taking these medications     glucose polymer  (POLYCOSE) 94 % POWD      pioglitazone (ACTOS) 15 MG tablet      potassium chloride 20 MEQ/15ML (10%) solution         Discharged Condition: Improved and stable.    Consults: None.  Significant Diagnostic Studies: Dg Chest 2 View  06/29/2011  *RADIOLOGY REPORT*  Clinical Data: Weakness  CHEST - 2 VIEW  Comparison: Portable chest x-ray of 07/27/2008  Findings: The lungs are clear.  Cardiomegaly is stable.  No bony abnormality is seen.  IMPRESSION: Stable cardiomegaly.  No active lung disease.  Original Report Authenticated By: Juline Patch, M.D.   Ct Head Wo Contrast  06/29/2011  *RADIOLOGY REPORT*  Clinical Data: Hypertension  CT HEAD WITHOUT CONTRAST  Technique:  Contiguous axial images were obtained from the base of the skull through the vertex without contrast.  Comparison: None.  Findings: The ventricular system is prominent as are the cortical sulci diffusely, consistent with atrophy.  The fourth ventricle and basilar cisterns are unremarkable.  No hemorrhage, mass lesion, or acute infarction is seen.  Minimal small vessel ischemic change is noted. On bone window images no bony abnormality is seen.  IMPRESSION: Atrophy and minimal small vessel ischemic change.  No acute abnormality.  Original Report Authenticated By: Renae Fickle  D. Gery Pray, M.D.    Lab Results: Basic Metabolic Panel:  Basename 06/30/11 0518 06/29/11 1211  NA 141 137  K 3.9 3.7  CL 106 100  CO2 28 28  GLUCOSE 108* 105*  BUN 36* 43*  CREATININE 0.95 1.06  CALCIUM 10.0 10.5  MG -- --  PHOS -- --   Liver Function Tests:  [facility] 06/30/11 0518 06/29/11 1211  AST 18 19  ALT 20 24  ALKPHOS 37* 42  BILITOT 0.3 0.3  PROT 6.2 6.9  ALBUMIN 3.2* 3.7     CBC:  Basename 06/30/11 0518 06/29/11 1211  WBC 7.9 9.7  NEUTROABS -- 6.7  HGB 12.9 13.6  HCT 39.7 42.1  MCV 89.2 88.6  PLT 281 286       Hospital Course: This very pleasant 77 year old lady was admitted with an acute syncopal episode preceded by  severe vertigo. Please see initial history and physical examination. It was felt clinically that she had either benign positional vertigo or acute labyrinthitis. She was also slightly dehydrated by lab work. She was admitted overnight and given intravenous fluids. Serial cardiac enzymes have been negative and serial electrocardiograms have not changed and her clinical picture was not really want anything cardiac. Her CT head scan is unremarkable with no evidence of new stroke. This morning she feels well and all her symptoms have resolved. She is keen to go home. Discharge Exam: Blood pressure 136/63, pulse 56, temperature 98 F (36.7 C), temperature source Oral, resp. rate 63, height 5' (1.524 m), weight 115.667 kg (255 lb), SpO2 100.00%. She looks systemically well. She is alert and orientated. There are no focal neurological signs. In particular there is no limb weakness nor there are any cerebellar signs. There is no nystagmus. Heart sounds are present and normal. Lung fields are clear.  Disposition: Home. I strongly encouraged to call Dr Gerilyn Pilgrim, neurologist to get an outpatient appointment soon.  Discharge Orders    Future Orders Please Complete By Expires   Diet - low sodium heart healthy      Increase activity slowly         Follow-up Information    Follow up with HALL,ZACK. Call in 3 days.      Follow up with DOONQUAH,KOFI, MD. Call in 3 days. (Please call Neurologist and make an appt to be seen soon)    Contact information:   [address] [place] [address] [phone]          Signed: Wilson Singer Pager [phone]  06/30/2011, 10:31 AM

## 2011-08-01 ENCOUNTER — Other Ambulatory Visit: Payer: Self-pay | Admitting: Neurology

## 2011-08-01 DIAGNOSIS — G459 Transient cerebral ischemic attack, unspecified: Secondary | ICD-10-CM

## 2011-08-03 ENCOUNTER — Ambulatory Visit (HOSPITAL_COMMUNITY)
Admission: RE | Admit: 2011-08-03 | Discharge: 2011-08-03 | Disposition: A | Discharge status: Home / Self Care | Payer: Medicare Other | Source: Ambulatory Visit | Attending: Neurology | Admitting: Neurology

## 2011-08-03 DIAGNOSIS — G459 Transient cerebral ischemic attack, unspecified: Secondary | ICD-10-CM | POA: Insufficient documentation

## 2011-08-03 DIAGNOSIS — I10 Essential (primary) hypertension: Secondary | ICD-10-CM | POA: Insufficient documentation

## 2011-08-03 DIAGNOSIS — E119 Type 2 diabetes mellitus without complications: Secondary | ICD-10-CM | POA: Insufficient documentation

## 2011-08-03 DIAGNOSIS — R55 Syncope and collapse: Secondary | ICD-10-CM | POA: Insufficient documentation

## 2011-08-03 DIAGNOSIS — I2510 Atherosclerotic heart disease of native coronary artery without angina pectoris: Secondary | ICD-10-CM | POA: Insufficient documentation

## 2011-10-30 ENCOUNTER — Encounter: Payer: Self-pay | Admitting: *Deleted

## 2011-10-30 ENCOUNTER — Encounter: Payer: Self-pay | Admitting: Cardiology

## 2011-10-30 ENCOUNTER — Ambulatory Visit (INDEPENDENT_AMBULATORY_CARE_PROVIDER_SITE_OTHER): Payer: Medicare Other | Admitting: Cardiology

## 2011-10-30 VITALS — BP 149/73 | HR 70 | Resp 18 | Ht 60.0 in | Wt 258.0 lb

## 2011-10-30 DIAGNOSIS — I480 Paroxysmal atrial fibrillation: Secondary | ICD-10-CM | POA: Insufficient documentation

## 2011-10-30 DIAGNOSIS — I10 Essential (primary) hypertension: Secondary | ICD-10-CM

## 2011-10-30 DIAGNOSIS — G4730 Sleep apnea, unspecified: Secondary | ICD-10-CM | POA: Insufficient documentation

## 2011-10-30 DIAGNOSIS — K5730 Diverticulosis of large intestine without perforation or abscess without bleeding: Secondary | ICD-10-CM

## 2011-10-30 DIAGNOSIS — E785 Hyperlipidemia, unspecified: Secondary | ICD-10-CM | POA: Insufficient documentation

## 2011-10-30 DIAGNOSIS — K5790 Diverticulosis of intestine, part unspecified, without perforation or abscess without bleeding: Secondary | ICD-10-CM | POA: Insufficient documentation

## 2011-10-30 DIAGNOSIS — I4891 Unspecified atrial fibrillation: Secondary | ICD-10-CM

## 2011-10-30 MED ORDER — LOSARTAN POTASSIUM 100 MG PO TABS: 100.0000 mg | ORAL_TABLET | Freq: Every day | ORAL | Status: DC

## 2011-10-30 MED ORDER — LOSARTAN POTASSIUM 100 MG PO TABS: 100.0000 mg | ORAL_TABLET | Freq: Every day | ORAL | Status: AC

## 2011-10-30 NOTE — Progress Notes (Signed)
Patient ID: Stefanie [name], female   DOB: 1932-09-22, 78 y.o.   MRN: [id number]  HPI: Scheduled return visit for this delightful woman who was lost to followup 3 years ago, but now wishes to reestablish in our office.  She was initially seen for dyspnea, which improved dramatically after sleep apnea was diagnosed and treated.  Activity is markedly limited by orthopaedic problems of the lower extremities, but she has not had much in the way of shortness of breath in recent years.  She was admitted to hospital 4 months ago after suffering a syncopal spell.  No brain imaging was performed, but carotid ultrasound demonstrated atheroma without significant obstruction.  Control of blood pressure has been somewhat suboptimal based upon her measurements at home.  Prior to Admission medications   Medication Sig Start Date End Date Taking? Authorizing Provider  allopurinol (ZYLOPRIM) 100 MG tablet Take 100 mg by mouth 2 (two) times daily.   Yes Historical Provider, MD  aspirin 81 MG tablet Take 81 mg by mouth 2 (two) times daily.    Yes Historical Provider, MD  B Complex Vitamins (B-COMPLEX/B-12) TABS Take 1 tablet by mouth daily.     Yes Historical Provider, MD  bumetanide (BUMEX) 1 MG tablet Take 1 mg by mouth daily.     Yes Historical Provider, MD  cholecalciferol (VITAMIN D) 1000 UNITS tablet Take 1,000 Units by mouth daily.     Yes Historical Provider, MD  Choline Fenofibrate (TRILIPIX) 135 MG capsule Take 135 mg by mouth daily.     Yes Historical Provider, MD  colesevelam (WELCHOL) 625 MG tablet Take 1,875 mg by mouth 2 (two) times daily with a meal.    Yes Historical Provider, MD  ezetimibe (ZETIA) 10 MG tablet Take 10 mg by mouth daily.   Yes Historical Provider, MD  losartan (COZAAR) 100 MG tablet Take 1 tablet (100 mg total) by mouth daily. 10/30/11  Yes Kathlen Brunswick, MD  metoprolol tartrate (LOPRESSOR) 25 MG tablet Take 25 mg by mouth 2 (two) times daily.     Yes Historical Provider, MD  Multiple  Vitamin (MULTIVITAMIN) tablet Take 1 tablet by mouth daily.     Yes Historical Provider, MD  Omega-3 Fatty Acids (SALMON OIL PO) Take by mouth.   Yes Historical Provider, MD  omeprazole (PRILOSEC) 20 MG capsule Take 20 mg by mouth daily.     Yes Historical Provider, MD  polycarbophil (FIBERCON) 625 MG tablet Take 625 mg by mouth daily.     Yes Historical Provider, MD  potassium chloride (KLOR-CON) 20 MEQ packet Take 20 mEq by mouth daily.     Yes Historical Provider, MD  losartan (COZAAR) 100 MG tablet Take 1 tablet (100 mg total) by mouth daily. 10/30/11 10/29/12  Kathlen Brunswick, MD   Allergies  Allergen Reactions  . Ciprofloxacin     Sick  . Flagyl (Metronidazole)   . Hydrocodone-Acetaminophen Nausea Only  . Macrobid Nausea Only  . Monopril (Fosinopril Sodium)     Cough   . Morphine And Related   . Other     Cholesterol medicines Patient can not have green foods or Seeded foods  . Percocet (Oxycodone-Acetaminophen) Nausea Only  . Phenazopyridine Rash   Past medical history, social history, and family history reviewed and updated.  ROS: Denies chest discomfort, exertional dyspnea, orthopnea, PND, palpitations, lightheadedness.  She has noted mild edema.  All other systems reviewed and are negative.  PHYSICAL EXAM: BP 149/73  Pulse 70  Resp 18  Ht 5' (1.524 m)  Wt 117.028 kg (258 lb)  BMI 50.39 kg/m2; 14 pound weight loss since 2011 General-Well developed; no acute distress Body habitus-Obese Neck-No JVD; no carotid bruits Lungs-clear lung fields; resonant to percussion Cardiovascular-normal PMI; normal S1 and S2; grade 1-2/6 systolic ejection murmur Abdomen-normal bowel sounds; soft and non-tender without masses or organomegaly Musculoskeletal-No deformities, no cyanosis or clubbing Neurologic-Normal cranial nerves; symmetric strength and tone Skin-Warm, no significant lesions Extremities-distal pulses intact; no edema  ASSESSMENT AND PLAN:  [place] Bing,  MD 10/30/2011 2:55 PM

## 2011-10-30 NOTE — Progress Notes (Deleted)
Name: [name]    DOB: 10-01-1932  Age: 78 y.o.  MR#: [id number] Dr. Margo Aye

## 2011-10-30 NOTE — Patient Instructions (Signed)
Your physician recommends that you schedule a follow-up appointment in: 1 year  Your physician recommends that you return for lab work in:  1 month  Your physician has recommended you make the following change in your medication:  1 - Resume losartan 100 mg daily

## 2011-11-01 NOTE — Assessment & Plan Note (Signed)
Lipid profile was suboptimal when recently measured, but patient has no known atherosclerosis and does not necessarily meet criteria for pharmacologic therapy.

## 2011-11-01 NOTE — Assessment & Plan Note (Signed)
Blood pressure control has been generally good, but values above 140 are occasionally measured.  Losartan was discontinued recently due to concerns regarding low diastolic pressure.  It is more important to control elevated systolic pressure, and this medication will be resumed.  Electrolytes and renal function will be followed.

## 2011-11-01 NOTE — Assessment & Plan Note (Addendum)
Modest weight loss since April 2012, but slight weight gain over the past few months.  The need for caloric restriction was once again discussed as well as the potential benefits of increased activity.  She walks with a cane at a slow pace and has limited options for aerobic exercise.

## 2011-11-01 NOTE — Assessment & Plan Note (Signed)
No documentation of atrial fibrillation in the available EKGs.  Previous episode apparently occurred prior to 2008.  No specific therapy is required at present

## 2011-11-04 NOTE — Progress Notes (Addendum)
Patient ID: Stefanie [name], female   DOB: Feb 16, 1933, 78 y.o.   MRN: [id number]  HPI: Return visit requested by the patient after a three-year hiatus due to her inability to renew cardiac medications.  She has no known cardiovascular disease and previously saw me for symptoms that apparently reflected sleep apnea and that improved with appropriate treatment for that entity.  She has done well since then, with a fairly sedentary lifestyle but has been able to complete her housework without difficulty.  She required hospital admission in 12/12 for syncope with no etiology identified.   Antihypertensive regimen was recently readjusted due to low blood pressures without symptoms.  Prior to Admission medications   Medication Sig Start Date End Date Taking? Authorizing Provider  allopurinol (ZYLOPRIM) 100 MG tablet Take 100 mg by mouth 2 (two) times daily.   Yes Historical Provider, MD  aspirin 81 MG tablet Take 81 mg by mouth 2 (two) times daily.    Yes Historical Provider, MD  B Complex Vitamins (B-COMPLEX/B-12) TABS Take 1 tablet by mouth daily.     Yes Historical Provider, MD  bumetanide (BUMEX) 1 MG tablet Take 1 mg by mouth daily.     Yes Historical Provider, MD  cholecalciferol (VITAMIN D) 1000 UNITS tablet Take 1,000 Units by mouth daily.     Yes Historical Provider, MD  Choline Fenofibrate (TRILIPIX) 135 MG capsule Take 135 mg by mouth daily.     Yes Historical Provider, MD  colesevelam (WELCHOL) 625 MG tablet Take 1,875 mg by mouth 2 (two) times daily with a meal.    Yes Historical Provider, MD  ezetimibe (ZETIA) 10 MG tablet Take 10 mg by mouth daily.   Yes Historical Provider, MD  losartan (COZAAR) 100 MG tablet Take 1 tablet (100 mg total) by mouth daily. 10/30/11  Yes Kathlen Brunswick, MD  metoprolol tartrate (LOPRESSOR) 25 MG tablet Take 25 mg by mouth 2 (two) times daily.     Yes Historical Provider, MD  Multiple Vitamin (MULTIVITAMIN) tablet Take 1 tablet by mouth daily.     Yes Historical  Provider, MD  Omega-3 Fatty Acids (SALMON OIL PO) Take by mouth.   Yes Historical Provider, MD  omeprazole (PRILOSEC) 20 MG capsule Take 20 mg by mouth daily.     Yes Historical Provider, MD  polycarbophil (FIBERCON) 625 MG tablet Take 625 mg by mouth daily.     Yes Historical Provider, MD  potassium chloride (KLOR-CON) 20 MEQ packet Take 20 mEq by mouth daily.     Yes Historical Provider, MD  losartan (COZAAR) 100 MG tablet Take 1 tablet (100 mg total) by mouth daily. 10/30/11 10/29/12  Kathlen Brunswick, MD   Allergies  Allergen Reactions  . Ciprofloxacin     Sick  . Flagyl (Metronidazole)   . Hydrocodone-Acetaminophen Nausea Only  . Macrobid Nausea Only  . Monopril (Fosinopril Sodium)     Cough   . Morphine And Related   . Other     Cholesterol medicines Patient can not have green foods or Seeded foods  . Percocet (Oxycodone-Acetaminophen) Nausea Only  . Phenazopyridine Rash     Past medical history, social history, and family history reviewed and updated.  ROS: Denies chest pain, dyspnea, orthopnea, PND or palpitations.  All other systems reviewed and are negative.  PHYSICAL EXAM: BP 149/73  Pulse 70  Resp 18  Ht 5' (1.524 m)  Wt 117.028 kg (258 lb)  BMI 50.39 kg/m2  General-Well developed; no acute distress Body  habitus-obese Neck-No JVD; no carotid bruits Lungs-clear lung fields; resonant to percussion Cardiovascular-normal PMI; normal S1 and S2; grade 1-2/6 systolic ejection murmur Abdomen-normal bowel sounds; soft and non-tender without masses or organomegaly Musculoskeletal-No deformities, no cyanosis or clubbing Neurologic-Normal cranial nerves; symmetric strength and tone Skin-Warm, no significant lesions Extremities-distal pulses intact; no edema  ASSESSMENT AND PLAN:  [place] Bing, MD 11/04/2011 9:56 AM

## 2011-11-04 NOTE — Progress Notes (Deleted)
Patient ID: Stefanie [name], female   DOB: 08/01/1932, 78 y.o.   MRN: [id number]

## 2011-11-29 ENCOUNTER — Other Ambulatory Visit: Payer: Self-pay | Admitting: *Deleted

## 2011-11-29 DIAGNOSIS — E785 Hyperlipidemia, unspecified: Secondary | ICD-10-CM

## 2011-12-01 LAB — LIPID PANEL
HDL: 53 mg/dL (ref >39)
LDL Cholesterol: 110 mg/dL — ABNORMAL HIGH (ref 0–99)
Triglycerides: 112 mg/dL (ref <150)
VLDL: 22 mg/dL (ref 0–40)

## 2011-12-03 ENCOUNTER — Encounter: Payer: Self-pay | Admitting: *Deleted

## 2012-04-21 ENCOUNTER — Other Ambulatory Visit (HOSPITAL_COMMUNITY): Payer: Self-pay | Admitting: Internal Medicine

## 2012-04-21 DIAGNOSIS — Z139 Encounter for screening, unspecified: Secondary | ICD-10-CM

## 2012-04-24 ENCOUNTER — Ambulatory Visit (HOSPITAL_COMMUNITY)
Admission: RE | Admit: 2012-04-24 | Discharge: 2012-04-24 | Disposition: A | Discharge status: Home / Self Care | Payer: Medicare Other | Source: Ambulatory Visit | Attending: Internal Medicine | Admitting: Internal Medicine

## 2012-04-24 DIAGNOSIS — Z1231 Encounter for screening mammogram for malignant neoplasm of breast: Secondary | ICD-10-CM | POA: Insufficient documentation

## 2012-04-24 DIAGNOSIS — Z139 Encounter for screening, unspecified: Secondary | ICD-10-CM

## 2012-10-30 ENCOUNTER — Ambulatory Visit (INDEPENDENT_AMBULATORY_CARE_PROVIDER_SITE_OTHER): Payer: Medicare Other | Admitting: Cardiology

## 2012-10-30 ENCOUNTER — Encounter: Payer: Self-pay | Admitting: Cardiology

## 2012-10-30 VITALS — BP 160/90 | HR 60 | Wt 260.0 lb

## 2012-10-30 DIAGNOSIS — I4891 Unspecified atrial fibrillation: Secondary | ICD-10-CM

## 2012-10-30 DIAGNOSIS — I480 Paroxysmal atrial fibrillation: Secondary | ICD-10-CM

## 2012-10-30 DIAGNOSIS — R55 Syncope and collapse: Secondary | ICD-10-CM

## 2012-10-30 DIAGNOSIS — G4730 Sleep apnea, unspecified: Secondary | ICD-10-CM

## 2012-10-30 DIAGNOSIS — I10 Essential (primary) hypertension: Secondary | ICD-10-CM

## 2012-10-30 DIAGNOSIS — E785 Hyperlipidemia, unspecified: Secondary | ICD-10-CM

## 2012-10-30 NOTE — Patient Instructions (Addendum)
Your physician recommends that you schedule a follow-up appointment in: 1 year  

## 2012-10-30 NOTE — Progress Notes (Deleted)
Name: Stefanie [name]    DOB: 1932/08/20  Age: 80 y.o.  MR#: [id number]       PCP:  Dwana Melena, MD      Insurance: Payor: [employer] [place] [employer]  Plan: BLUE MEDICARE  Product Type: *No Product type*    CC:   No chief complaint on file.   VS Filed Vitals:   10/30/12 1312  BP: 160/90  Pulse: 60  Weight: 260 lb (117.935 kg)    Weights Current Weight  10/30/12 260 lb (117.935 kg)  10/30/11 258 lb (117.028 kg)  06/29/11 255 lb (115.667 kg)    Blood Pressure  BP Readings from Last 3 Encounters:  10/30/12 160/90  10/30/11 149/73  06/30/11 136/63     Admit date:  (Not on file) Last encounter with RMR:  Visit date not found   Allergy Ciprofloxacin; Flagyl; Hydrocodone-acetaminophen; Monopril; Morphine and related; Nitrofurantoin monohyd macro; Other; Percocet; and Phenazopyridine  Current Outpatient Prescriptions  Medication Sig Dispense Refill  . allopurinol (ZYLOPRIM) 100 MG tablet Take 100 mg by mouth 2 (two) times daily.      Marland Kitchen aspirin 81 MG tablet Take 81 mg by mouth 2 (two) times daily.       . B Complex Vitamins (B-COMPLEX/B-12) TABS Take 1 tablet by mouth daily.        . bumetanide (BUMEX) 1 MG tablet Take 1 mg by mouth daily.        . cholecalciferol (VITAMIN D) 1000 UNITS tablet Take 1,000 Units by mouth daily.        . Choline Fenofibrate (TRILIPIX) 135 MG capsule Take 135 mg by mouth daily.        . colesevelam (WELCHOL) 625 MG tablet Take 1,875 mg by mouth 2 (two) times daily with a meal.       . losartan (COZAAR) 100 MG tablet Take 1 tablet (100 mg total) by mouth daily.  30 tablet  12  . metoprolol tartrate (LOPRESSOR) 25 MG tablet Take 25 mg by mouth 2 (two) times daily.        . Omega-3 Fatty Acids (SALMON OIL PO) Take by mouth.      Marland Kitchen omeprazole (PRILOSEC) 20 MG capsule Take 20 mg by mouth daily.        . polycarbophil (FIBERCON) 625 MG tablet Take 625 mg by mouth daily.        . potassium chloride (KLOR-CON) 20 MEQ packet Take 20 mEq by mouth daily.          No current facility-administered medications for this visit.    Discontinued Meds:    Medications Discontinued During This Encounter  Medication Reason  . ezetimibe (ZETIA) 10 MG tablet Error  . Multiple Vitamin (MULTIVITAMIN) tablet Error    Patient Active Problem List  Diagnosis  . Syncope  . Morbid obesity  . Sleep apnea  . Obesity  . Diverticular disease  . Paroxysmal atrial fibrillation  . Hypertension  . Hyperlipidemia    LABS    Component Value Date/Time   NA 141 06/30/2011 0518   NA 137 06/29/2011 1211   NA 138 02/18/2010 0610   K 3.9 06/30/2011 0518   K 3.7 06/29/2011 1211   K 3.4* 02/18/2010 0610   CL 106 06/30/2011 0518   CL 100 06/29/2011 1211   CL 105 02/18/2010 0610   CO2 28 06/30/2011 0518   CO2 28 06/29/2011 1211   CO2 25 02/18/2010 0610   GLUCOSE 108* 06/30/2011 0518  GLUCOSE 105* 06/29/2011 1211   GLUCOSE 95 02/18/2010 0610   BUN 36* 06/30/2011 0518   BUN 43* 06/29/2011 1211   BUN 11 02/18/2010 0610   CREATININE 0.95 06/30/2011 0518   CREATININE 1.06 06/29/2011 1211   CREATININE 1.00 02/18/2010 0610   CALCIUM 10.0 06/30/2011 0518   CALCIUM 10.5 06/29/2011 1211   CALCIUM 8.5 02/18/2010 0610   GFRNONAA 57* 06/30/2011 0518   GFRNONAA 50* 06/29/2011 1211   GFRNONAA 54* 02/18/2010 0610   GFRAA 66* 06/30/2011 0518   GFRAA 58* 06/29/2011 1211   GFRAA  Value: >60        The eGFR has been calculated using the MDRD equation. This calculation has not been validated in all clinical situations. eGFR's persistently <60 mL/min signify possible Chronic Kidney Disease. 02/18/2010 0610   CMP     Component Value Date/Time   NA 141 06/30/2011 0518   K 3.9 06/30/2011 0518   CL 106 06/30/2011 0518   CO2 28 06/30/2011 0518   GLUCOSE 108* 06/30/2011 0518   BUN 36* 06/30/2011 0518   CREATININE 0.95 06/30/2011 0518   CALCIUM 10.0 06/30/2011 0518   PROT 6.2 06/30/2011 0518   ALBUMIN 3.2* 06/30/2011 0518   AST 18 06/30/2011 0518   ALT 20 06/30/2011 0518   ALKPHOS  37* 06/30/2011 0518   BILITOT 0.3 06/30/2011 0518   GFRNONAA 57* 06/30/2011 0518   GFRAA 66* 06/30/2011 0518       Component Value Date/Time   WBC 7.9 06/30/2011 0518   WBC 9.7 06/29/2011 1211   WBC 6.9 02/19/2010 0635   HGB 12.9 06/30/2011 0518   HGB 13.6 06/29/2011 1211   HGB 10.7* 02/19/2010 0635   HCT 39.7 06/30/2011 0518   HCT 42.1 06/29/2011 1211   HCT 32.6* 02/19/2010 0635   MCV 89.2 06/30/2011 0518   MCV 88.6 06/29/2011 1211   MCV 84.4 02/19/2010 0635    Lipid Panel     Component Value Date/Time   CHOL 185 12/01/2011 0921   TRIG 112 12/01/2011 0921   HDL 53 12/01/2011 0921   CHOLHDL 3.5 12/01/2011 0921   VLDL 22 12/01/2011 0921   LDLCALC 110* 12/01/2011 0921    ABG    Component Value Date/Time   PHART 7.411* 07/27/2008 1645   PCO2ART 41.4 07/27/2008 1645   PO2ART 84.2 07/27/2008 1645   HCO3 25.8* 07/27/2008 1645   TCO2 22.7 07/27/2008 1645   ACIDBASEDEF 1.5 07/27/2008 1645   O2SAT 96.8 07/27/2008 1645     Lab Results  Component Value Date   TSH 2.476 06/30/2011   BNP (last 3 results) No results found for this basename: PROBNP,  in the last 8760 hours Cardiac Panel (last 3 results) No results found for this basename: CKTOTAL, CKMB, TROPONINI, RELINDX,  in the last 72 hours  Iron/TIBC/Ferritin No results found for this basename: iron, tibc, ferritin     EKG Orders placed during the hospital encounter of 06/29/11  . EKG 12-LEAD  . EKG 12-LEAD  . EKG     Prior Assessment and Plan Problem List as of 10/30/2012     ICD-9-CM     Cardiology Problems   Syncope   Paroxysmal atrial fibrillation   Last Assessment & Plan   10/30/2011 Office Visit Written 11/01/2011 10:16 AM by Kathlen Brunswick, MD     No documentation of atrial fibrillation in the available EKGs.  Previous episode apparently occurred prior to 2008.  No specific therapy is required at present    Hypertension  Last Assessment & Plan   10/30/2011 Office Visit Written 11/01/2011 10:11 AM by Kathlen Brunswick, MD     Blood pressure control has been generally good, but values above 140 are occasionally measured.  Losartan was discontinued recently due to concerns regarding low diastolic pressure.  It is more important to control elevated systolic pressure, and this medication will be resumed.  Electrolytes and renal function will be followed.    Hyperlipidemia   Last Assessment & Plan   10/30/2011 Office Visit Written 11/01/2011 10:05 AM by Kathlen Brunswick, MD     Lipid profile was suboptimal when recently measured, but patient has no known atherosclerosis and does not necessarily meet criteria for pharmacologic therapy.      Other   Morbid obesity   Last Assessment & Plan   10/30/2011 Office Visit Edited 11/01/2011 10:09 AM by Kathlen Brunswick, MD     Modest weight loss since April 2012, but slight weight gain over the past few months.  The need for caloric restriction was once again discussed as well as the potential benefits of increased activity.  She walks with a cane at a slow pace and has limited options for aerobic exercise.    Sleep apnea   Obesity   Diverticular disease       Imaging: No results found.

## 2012-10-31 ENCOUNTER — Encounter: Payer: Self-pay | Admitting: Cardiology

## 2012-10-31 NOTE — Assessment & Plan Note (Signed)
Mild to moderate elevation of lipids without a clear indication for pharmacologic therapy.

## 2012-10-31 NOTE — Progress Notes (Signed)
Patient ID: Stefanie [name], female   DOB: Jun 17, 1933, 79 y.o.   MRN: [id number] HPI: Schedule return visit for this nice woman with a history of obesity, hypertension and sleep apnea.  Except for weight gain and immobility, she has done well since her last visit. She notes some orthopedic problems including a previous right leg fracture that required surgical repair, but has not been told that further surgery would be beneficial. She is followed by Dr. Lajoyce Corners.  She continues to derive substantial benefit from nocturnal CPAP.  Current Outpatient Prescriptions  Medication Sig Dispense Refill  . allopurinol (ZYLOPRIM) 100 MG tablet Take 100 mg by mouth 2 (two) times daily.      Marland Kitchen aspirin 81 MG tablet Take 81 mg by mouth 2 (two) times daily.       . B Complex Vitamins (B-COMPLEX/B-12) TABS Take 1 tablet by mouth daily.        . bumetanide (BUMEX) 1 MG tablet Take 1 mg by mouth daily.        . cholecalciferol (VITAMIN D) 1000 UNITS tablet Take 1,000 Units by mouth daily.        . Choline Fenofibrate (TRILIPIX) 135 MG capsule Take 135 mg by mouth daily.        . colesevelam (WELCHOL) 625 MG tablet Take 1,875 mg by mouth 2 (two) times daily with a meal.       . losartan (COZAAR) 100 MG tablet Take 1 tablet (100 mg total) by mouth daily.  30 tablet  12  . metoprolol tartrate (LOPRESSOR) 25 MG tablet Take 25 mg by mouth 2 (two) times daily.        . Omega-3 Fatty Acids (SALMON OIL PO) Take by mouth.      Marland Kitchen omeprazole (PRILOSEC) 20 MG capsule Take 20 mg by mouth daily.        . polycarbophil (FIBERCON) 625 MG tablet Take 625 mg by mouth daily.        . potassium chloride (KLOR-CON) 20 MEQ packet Take 20 mEq by mouth daily.         No current facility-administered medications for this visit.   Allergies  Allergen Reactions  . Ciprofloxacin     Sick  . Flagyl (Metronidazole)   . Hydrocodone-Acetaminophen Nausea Only  . Monopril (Fosinopril Sodium)     Cough   . Morphine And Related   . Nitrofurantoin  Monohyd Macro Nausea Only  . Other     Cholesterol medicines Patient can not have green foods or Seeded foods  . Percocet (Oxycodone-Acetaminophen) Nausea Only  . Phenazopyridine Rash     Past medical history, social history, and family history reviewed and updated.  ROS: Denies palpitations, chest pain, lightheadedness or syncope. She has not had much in the way of peripheral edema. All other systems reviewed and are negative.  PHYSICAL EXAM: BP 160/90  Pulse 60  Wt 117.935 kg (260 lb)  BMI 50.78 kg/m2;  Body mass index is 50.78 kg/(m^2). General-Well developed; no acute distress Body habitus-obese Neck-No JVD; no carotid bruits Lungs-clear lung fields; resonant to percussion Cardiovascular-normal PMI; normal S1 and S2 Abdomen-normal bowel sounds; soft and non-tender without masses or organomegaly Musculoskeletal-No deformities, no cyanosis or clubbing Neurologic-Normal cranial nerves; symmetric strength and tone Skin-Warm, no significant lesions Extremities-distal pulses intact; no edema  [place] Bing, MD 10/31/2012  9:33 AM  ASSESSMENT AND PLAN

## 2012-10-31 NOTE — Assessment & Plan Note (Signed)
Patient reports normal blood pressures at home and appears to have a component of whitecoat hypertension. She will continue to monitor and report any significant elevations.  Fasting blood glucose and A1c are mildly elevated suggesting a component of glucose intolerance and meeting criteria for metabolic syndrome. Patient referred to dietitian for assistance with weight reduction.

## 2012-11-17 ENCOUNTER — Other Ambulatory Visit: Payer: Self-pay | Admitting: Cardiology

## 2012-11-18 NOTE — Telephone Encounter (Signed)
Fax Received. Refill Completed. Traeger Sultana Chowoe (R.M.A)   

## 2013-05-18 ENCOUNTER — Other Ambulatory Visit: Payer: Self-pay | Admitting: Cardiology

## 2013-06-15 ENCOUNTER — Other Ambulatory Visit (HOSPITAL_COMMUNITY): Payer: Self-pay | Admitting: Internal Medicine

## 2013-06-15 DIAGNOSIS — Z139 Encounter for screening, unspecified: Secondary | ICD-10-CM

## 2013-06-23 ENCOUNTER — Ambulatory Visit (HOSPITAL_COMMUNITY)
Admission: RE | Admit: 2013-06-23 | Discharge: 2013-06-23 | Disposition: A | Discharge status: Home / Self Care | Payer: Medicare Other | Source: Ambulatory Visit | Attending: Internal Medicine | Admitting: Internal Medicine

## 2013-06-23 DIAGNOSIS — Z139 Encounter for screening, unspecified: Secondary | ICD-10-CM

## 2013-06-23 DIAGNOSIS — Z1231 Encounter for screening mammogram for malignant neoplasm of breast: Secondary | ICD-10-CM | POA: Insufficient documentation

## 2013-07-17 ENCOUNTER — Other Ambulatory Visit (HOSPITAL_COMMUNITY): Payer: Self-pay | Admitting: Internal Medicine

## 2013-07-17 DIAGNOSIS — R1310 Dysphagia, unspecified: Secondary | ICD-10-CM

## 2013-07-21 ENCOUNTER — Other Ambulatory Visit (HOSPITAL_COMMUNITY): Payer: Medicare Other

## 2013-07-23 ENCOUNTER — Ambulatory Visit (HOSPITAL_COMMUNITY)
Admission: RE | Admit: 2013-07-23 | Discharge: 2013-07-23 | Disposition: A | Discharge status: Home / Self Care | Payer: Medicare Other | Source: Ambulatory Visit | Attending: Internal Medicine | Admitting: Internal Medicine

## 2013-07-23 DIAGNOSIS — K224 Dyskinesia of esophagus: Secondary | ICD-10-CM | POA: Insufficient documentation

## 2013-07-23 DIAGNOSIS — K219 Gastro-esophageal reflux disease without esophagitis: Secondary | ICD-10-CM | POA: Insufficient documentation

## 2013-07-23 DIAGNOSIS — R1310 Dysphagia, unspecified: Secondary | ICD-10-CM | POA: Insufficient documentation

## 2013-10-01 ENCOUNTER — Encounter: Payer: Self-pay | Admitting: Cardiology

## 2013-10-19 ENCOUNTER — Ambulatory Visit: Payer: Medicare Other | Admitting: Cardiology

## 2013-11-10 ENCOUNTER — Ambulatory Visit: Payer: Medicare Other | Admitting: Cardiology

## 2013-11-30 ENCOUNTER — Ambulatory Visit (INDEPENDENT_AMBULATORY_CARE_PROVIDER_SITE_OTHER): Payer: Medicare Other | Admitting: Cardiology

## 2013-11-30 ENCOUNTER — Encounter: Payer: Self-pay | Admitting: Cardiology

## 2013-11-30 VITALS — BP 157/72 | HR 82 | Ht 60.0 in | Wt 255.0 lb

## 2013-11-30 DIAGNOSIS — I10 Essential (primary) hypertension: Secondary | ICD-10-CM

## 2013-11-30 DIAGNOSIS — I4891 Unspecified atrial fibrillation: Secondary | ICD-10-CM

## 2013-11-30 DIAGNOSIS — E785 Hyperlipidemia, unspecified: Secondary | ICD-10-CM

## 2013-11-30 NOTE — Patient Instructions (Signed)
Your physician wants you to follow-up in: 1 year You will receive a reminder letter in the mail two months in advance. If you don't receive a letter, please call our office to schedule the follow-up appointment.    Your physician recommends that you continue on your current medications as directed. Please refer to the Current Medication list given to you today.     Thank you for choosing [facility] !  

## 2013-11-30 NOTE — Progress Notes (Signed)
Clinical Summary Stefanie [name] is a 80 y.o.female former patient of Stefanie Stefanie [name], this is our first visit together. She is seen for the following medical problems.  1. HTN - often elevated in clinic with normal home values from prior notes - checks at home every morning, somewhat variable. Ranges from 114-160s/40-60s.   2. OSA - compliant with CPAP  3. Hyperlipidemia - muscles aches on several statins, currently fenofibrate, fish oil, welchol. - no recent panel in our system. Labs from Stefanie [name]   Past Medical History  Diagnosis Date  . Breast mass     Left  . Dyspnea on exertion 2010  . Sleep apnea 2010    Severe  . Obesity   . Syncope 06/2011  . Osteoporosis   . Diverticular disease     acute diverticulitis in 02/2010  . Gastroesophageal reflux disease     hiatal hernia  . Asthma   . Paroxysmal atrial fibrillation   . Hypertension     Lab  10/2011: Normal CMet except GFR of 50, normal microalbumin, normal CBC, uric acid of 7.9, A1c of 6.1, normal urinalysis, low vitamin D; negative stress nuclear study in 2008  . Hyperlipidemia     Lipid profile 10/2011:240, 132, 52, 162  . Chronic low back pain   . Urinary incontinence     Interstitial cystitis  . Chronic kidney disease     Borderline with serum creatinine at the upper limit of normal in 10/2011     Allergies  Allergen Reactions  . Ace Inhibitors Cough    Occurred with Monopril  . Ciprofloxacin     Sick  . Hydrocodone-Acetaminophen Nausea Only  . Monopril [Fosinopril Sodium]     Cough   . Morphine And Related   . Nitrofurantoin Monohyd Macro Nausea Only    Also skin rash, edema   . Percocet [Oxycodone-Acetaminophen] Nausea Only  . Statins Other (See Comments)    Myalgias; prior exposure to pravastatin, atorvastatin, lovastatin, Lescol, simvastatin and ezetimibe  . Flagyl [Metronidazole] Nausea Only  . Phenazopyridine Rash     Current Outpatient Prescriptions  Medication Sig Dispense Refill  .  allopurinol (ZYLOPRIM) 100 MG tablet Take 100 mg by mouth 2 (two) times daily.      Marland Kitchen. aspirin 81 MG tablet Take 81 mg by mouth 2 (two) times daily.       . B Complex Vitamins (B-COMPLEX/B-12) TABS Take 1 tablet by mouth daily.        . bumetanide (BUMEX) 1 MG tablet Take 1 mg by mouth daily.        . cholecalciferol (VITAMIN D) 1000 UNITS tablet Take 1,000 Units by mouth daily.        . Choline Fenofibrate (TRILIPIX) 135 MG capsule Take 135 mg by mouth daily.        . colesevelam (WELCHOL) 625 MG tablet Take 1,875 mg by mouth 2 (two) times daily with a meal.       . losartan (COZAAR) 100 MG tablet TAKE (1) TABLET BY MOUTH ONCE DAILY.  30 tablet  6  . metoprolol tartrate (LOPRESSOR) 25 MG tablet Take 25 mg by mouth 2 (two) times daily.        . Omega-3 Fatty Acids (SALMON OIL PO) Take by mouth.      Marland Kitchen. omeprazole (PRILOSEC) 20 MG capsule Take 20 mg by mouth daily.        . polycarbophil (FIBERCON) 625 MG tablet Take 625 mg by mouth daily.        .Marland Kitchen  potassium chloride (KLOR-CON) 20 MEQ packet Take 20 mEq by mouth daily.         No current facility-administered medications for this visit.     Past Surgical History  Procedure Laterality Date  . Lumbar laminectomy  1992  . Cholecystectomy  1993  . Dilation and curettage of uterus  1986  . Cataract extraction    . Cholecystectomy  1993  . Tubal ligation    . Colonoscopy  2008    Negative screening study     Allergies  Allergen Reactions  . Ace Inhibitors Cough    Occurred with Monopril  . Ciprofloxacin     Sick  . Hydrocodone-Acetaminophen Nausea Only  . Monopril [Fosinopril Sodium]     Cough   . Morphine And Related   . Nitrofurantoin Monohyd Macro Nausea Only    Also skin rash, edema   . Percocet [Oxycodone-Acetaminophen] Nausea Only  . Statins Other (See Comments)    Myalgias; prior exposure to pravastatin, atorvastatin, lovastatin, Lescol, simvastatin and ezetimibe  . Flagyl [Metronidazole] Nausea Only  . Phenazopyridine  Rash      Family History  Problem Relation Age of Onset  . Heart attack Mother   . Asthma Father   . Diabetes Father   . Heart failure Father   . Colitis Sister   . Heart failure Brother   . Pancreatic cancer Brother   . Diabetes Sister   . Transient ischemic attack Sister      Social History Stefanie [name] reports that she has never smoked. She has never used smokeless tobacco. Stefanie [name] reports that she does not drink alcohol.   Review of Systems CONSTITUTIONAL: No weight loss, fever, chills, weakness or fatigue.  HEENT: Eyes: No visual loss, blurred vision, double vision or yellow sclerae.No hearing loss, sneezing, congestion, runny nose or sore throat.  SKIN: No rash or itching.  CARDIOVASCULAR: no chest pain, no palpitations RESPIRATORY: No shortness of breath, cough or sputum.  GASTROINTESTINAL: No anorexia, nausea, vomiting or diarrhea. No abdominal pain or blood.  GENITOURINARY: No burning on urination, no polyuria NEUROLOGICAL: No headache, dizziness, syncope, paralysis, ataxia, numbness or tingling in the extremities. No change in bowel or bladder control.  MUSCULOSKELETAL: No muscle, back pain, joint pain or stiffness.  LYMPHATICS: No enlarged nodes. No history of splenectomy.  PSYCHIATRIC: No history of depression or anxiety.  ENDOCRINOLOGIC: No reports of sweating, cold or heat intolerance. No polyuria or polydipsia.  Marland Kitchen.   Physical Examination p 82 bp 140/60 Wt 255 lbs BMI 50 Gen: resting comfortably, no acute distress HEENT: no scleral icterus, pupils equal round and reactive, no palptable cervical adenopathy,  CV: RRR, no m/r/g, no JVD Resp: Clear to auscultation bilaterally GI: abdomen is soft, non-tender, non-distended, normal bowel sounds, no hepatosplenomegaly MSK: extremities are warm, no edema.  Skin: warm, no rash Neuro:  no focal deficits Psych: appropriate affect   Diagnostic Studies  Echo 2008 FINDINGS:  1. Technically suboptimal, but  adequate echocardiographic study.  2. Normal left atrium, right atrium and right ventricle.  3. Normal proximal ascending aorta.  4. Normal aortic and mitral valves.  5. Tricuspid valve not well seen, but is grossly normal; physiologic  regurgitation. Inadequate visualization of the pulmonic valve and  proximal pulmonary artery.  6. Mild left ventricular dilatation; no hypertrophy; normal regional  and global function.  7. Normal IVC.  8. Comparison with prior study of October 07, 2003, shows there has been  interval left ventricular dilatation.  Assessment and Plan   1. HTN - somewhat up and down numbers at home, on average she is at goal. Given her age a SBP of 150 or less is an adequate target - continue current meds  2. OSA - continue CPAP  3. Hyperlipidemia - intolerant to statins. Most recent panel at goal, will continue current meds   F/u 1 year  Antoine Poche, M.D., F.A.C.C.

## 2013-12-01 ENCOUNTER — Encounter: Payer: Self-pay | Admitting: Cardiology

## 2014-07-19 DIAGNOSIS — G4733 Obstructive sleep apnea (adult) (pediatric): Secondary | ICD-10-CM | POA: Diagnosis not present

## 2014-10-08 DIAGNOSIS — G4733 Obstructive sleep apnea (adult) (pediatric): Secondary | ICD-10-CM | POA: Diagnosis not present

## 2014-10-18 DIAGNOSIS — E782 Mixed hyperlipidemia: Secondary | ICD-10-CM | POA: Diagnosis not present

## 2014-10-18 DIAGNOSIS — E1165 Type 2 diabetes mellitus with hyperglycemia: Secondary | ICD-10-CM | POA: Diagnosis not present

## 2014-10-20 DIAGNOSIS — Z6841 Body Mass Index (BMI) 40.0 and over, adult: Secondary | ICD-10-CM | POA: Diagnosis not present

## 2014-10-20 DIAGNOSIS — E1122 Type 2 diabetes mellitus with diabetic chronic kidney disease: Secondary | ICD-10-CM | POA: Diagnosis not present

## 2014-10-20 DIAGNOSIS — N183 Chronic kidney disease, stage 3 (moderate): Secondary | ICD-10-CM | POA: Diagnosis not present

## 2014-10-20 DIAGNOSIS — I10 Essential (primary) hypertension: Secondary | ICD-10-CM | POA: Diagnosis not present

## 2014-10-22 ENCOUNTER — Other Ambulatory Visit (HOSPITAL_COMMUNITY): Payer: Self-pay | Admitting: Internal Medicine

## 2014-10-22 DIAGNOSIS — L819 Disorder of pigmentation, unspecified: Secondary | ICD-10-CM

## 2014-10-22 DIAGNOSIS — Z136 Encounter for screening for cardiovascular disorders: Secondary | ICD-10-CM

## 2014-10-22 DIAGNOSIS — R6889 Other general symptoms and signs: Secondary | ICD-10-CM

## 2014-10-22 DIAGNOSIS — R209 Unspecified disturbances of skin sensation: Secondary | ICD-10-CM

## 2014-10-25 ENCOUNTER — Ambulatory Visit (HOSPITAL_COMMUNITY)
Admission: RE | Admit: 2014-10-25 | Discharge: 2014-10-25 | Disposition: A | Discharge status: Home / Self Care | Payer: Medicare Other | Source: Ambulatory Visit | Attending: Internal Medicine | Admitting: Internal Medicine

## 2014-10-25 DIAGNOSIS — M79605 Pain in left leg: Secondary | ICD-10-CM | POA: Diagnosis not present

## 2014-10-25 DIAGNOSIS — L819 Disorder of pigmentation, unspecified: Secondary | ICD-10-CM | POA: Diagnosis not present

## 2014-10-25 DIAGNOSIS — E785 Hyperlipidemia, unspecified: Secondary | ICD-10-CM | POA: Insufficient documentation

## 2014-10-25 DIAGNOSIS — M79604 Pain in right leg: Secondary | ICD-10-CM | POA: Diagnosis not present

## 2014-10-25 DIAGNOSIS — R209 Unspecified disturbances of skin sensation: Secondary | ICD-10-CM

## 2014-10-25 DIAGNOSIS — R6889 Other general symptoms and signs: Secondary | ICD-10-CM | POA: Diagnosis not present

## 2014-10-25 DIAGNOSIS — M79609 Pain in unspecified limb: Secondary | ICD-10-CM | POA: Diagnosis not present

## 2014-10-25 DIAGNOSIS — I10 Essential (primary) hypertension: Secondary | ICD-10-CM | POA: Insufficient documentation

## 2014-10-25 DIAGNOSIS — E119 Type 2 diabetes mellitus without complications: Secondary | ICD-10-CM | POA: Diagnosis not present

## 2014-10-25 DIAGNOSIS — Z136 Encounter for screening for cardiovascular disorders: Secondary | ICD-10-CM

## 2014-12-08 ENCOUNTER — Ambulatory Visit: Payer: Self-pay | Admitting: Cardiology

## 2014-12-14 ENCOUNTER — Ambulatory Visit (INDEPENDENT_AMBULATORY_CARE_PROVIDER_SITE_OTHER): Payer: Medicare Other | Admitting: Cardiology

## 2014-12-14 ENCOUNTER — Encounter: Payer: Self-pay | Admitting: Cardiology

## 2014-12-14 VITALS — BP 128/68 | HR 82 | Ht 60.0 in | Wt 230.2 lb

## 2014-12-14 DIAGNOSIS — E785 Hyperlipidemia, unspecified: Secondary | ICD-10-CM

## 2014-12-14 DIAGNOSIS — I10 Essential (primary) hypertension: Secondary | ICD-10-CM

## 2014-12-14 NOTE — Patient Instructions (Signed)
Your physician wants you to follow-up in: 1 year with DrBranch You will receive a reminder letter in the mail two months in advance. If you don't receive a letter, please call our office to schedule the follow-up appointment.     Your physician recommends that you continue on your current medications as directed. Please refer to the Current Medication list given to you today.      Thank you for choosing [facility] !        

## 2014-12-14 NOTE — Progress Notes (Signed)
Clinical Summary Stefanie [name] is a 81 y.o.female seen today for follow up of the following medical problems.   1. HTN - often elevated in clinic with normal home values from prior notes - checks at home every morning, typically 110-140s/50-60s  2. OSA - compliant with CPAP  3. Hyperlipidemia - muscles aches on several statins, currently fenofibrate, fish oil, welchol. - no recent panel in our system.  Past Medical History  Diagnosis Date  . Breast mass     Left  . Dyspnea on exertion 2010  . Sleep apnea 2010    Severe  . Obesity   . Syncope 06/2011  . Osteoporosis   . Diverticular disease     acute diverticulitis in 02/2010  . Gastroesophageal reflux disease     hiatal hernia  . Asthma   . Paroxysmal atrial fibrillation   . Hypertension     Lab  10/2011: Normal CMet except GFR of 50, normal microalbumin, normal CBC, uric acid of 7.9, A1c of 6.1, normal urinalysis, low vitamin D; negative stress nuclear study in 2008  . Hyperlipidemia     Lipid profile 10/2011:240, 132, 52, 162  . Chronic low back pain   . Urinary incontinence     Interstitial cystitis  . Chronic kidney disease     Borderline with serum creatinine at the upper limit of normal in 10/2011     Allergies  Allergen Reactions  . Ace Inhibitors Cough    Occurred with Monopril  . Ciprofloxacin     Sick  . Hydrocodone-Acetaminophen Nausea Only  . Monopril [Fosinopril Sodium]     Cough   . Morphine And Related   . Nitrofurantoin Monohyd Macro Nausea Only    Also skin rash, edema   . Percocet [Oxycodone-Acetaminophen] Nausea Only  . Statins Other (See Comments)    Myalgias; prior exposure to pravastatin, atorvastatin, lovastatin, Lescol, simvastatin and ezetimibe  . Flagyl [Metronidazole] Nausea Only  . Phenazopyridine Rash     Current Outpatient Prescriptions  Medication Sig Dispense Refill  . allopurinol (ZYLOPRIM) 100 MG tablet Take 100 mg by mouth 2 (two) times daily.    Marland Kitchen. aspirin 81 MG  tablet Take 81 mg by mouth 2 (two) times daily.     . bumetanide (BUMEX) 1 MG tablet Take 1 mg by mouth daily.      . cholecalciferol (VITAMIN D) 1000 UNITS tablet Take 1,000 Units by mouth daily.      . Choline Fenofibrate (TRILIPIX) 135 MG capsule Take 135 mg by mouth daily.      . colesevelam (WELCHOL) 625 MG tablet Take 1,875 mg by mouth 2 (two) times daily with a meal. Take 3 tablets twice daily    . docusate sodium (COLACE) 100 MG capsule Take 200 mg by mouth 2 (two) times daily.    Marland Kitchen. losartan (COZAAR) 100 MG tablet TAKE (1) TABLET BY MOUTH ONCE DAILY. 30 tablet 6  . metoprolol tartrate (LOPRESSOR) 25 MG tablet Take 25 mg by mouth 2 (two) times daily.      . Omega-3 Fatty Acids (SALMON OIL PO) Take 1,000 mg by mouth 2 (two) times daily.     . potassium chloride (KLOR-CON) 20 MEQ packet Take 20 mEq by mouth daily.       No current facility-administered medications for this visit.     Past Surgical History  Procedure Laterality Date  . Lumbar laminectomy  1992  . Cholecystectomy  1993  . Dilation and curettage of uterus  1986  . Cataract extraction    . Cholecystectomy  1993  . Tubal ligation    . Colonoscopy  2008    Negative screening study     Allergies  Allergen Reactions  . Ace Inhibitors Cough    Occurred with Monopril  . Ciprofloxacin     Sick  . Hydrocodone-Acetaminophen Nausea Only  . Monopril [Fosinopril Sodium]     Cough   . Morphine And Related   . Nitrofurantoin Monohyd Macro Nausea Only    Also skin rash, edema   . Percocet [Oxycodone-Acetaminophen] Nausea Only  . Statins Other (See Comments)    Myalgias; prior exposure to pravastatin, atorvastatin, lovastatin, Lescol, simvastatin and ezetimibe  . Flagyl [Metronidazole] Nausea Only  . Phenazopyridine Rash      Family History  Problem Relation Age of Onset  . Heart attack Mother   . Asthma Father   . Diabetes Father   . Heart failure Father   . Colitis Sister   . Heart failure Brother   .  Pancreatic cancer Brother   . Diabetes Sister   . Transient ischemic attack Sister      Social History Stefanie [name] reports that she has never smoked. She has never used smokeless tobacco. Stefanie [name] reports that she does not drink alcohol.   Review of Systems CONSTITUTIONAL: No weight loss, fever, chills, weakness or fatigue.  HEENT: Eyes: No visual loss, blurred vision, double vision or yellow sclerae.No hearing loss, sneezing, congestion, runny nose or sore throat.  SKIN: No rash or itching.  CARDIOVASCULAR: per HPI RESPIRATORY: No shortness of breath, cough or sputum.  GASTROINTESTINAL: No anorexia, nausea, vomiting or diarrhea. No abdominal pain or blood.  GENITOURINARY: No burning on urination, no polyuria NEUROLOGICAL: No headache, dizziness, syncope, paralysis, ataxia, numbness or tingling in the extremities. No change in bowel or bladder control.  MUSCULOSKELETAL: No muscle, back pain, joint pain or stiffness.  LYMPHATICS: No enlarged nodes. No history of splenectomy.  PSYCHIATRIC: No history of depression or anxiety.  ENDOCRINOLOGIC: No reports of sweating, cold or heat intolerance. No polyuria or polydipsia.  Marland Kitchen   Physical Examination Filed Vitals:   12/14/14 1005  BP: 128/68  Pulse: 82   Filed Vitals:   12/14/14 1005  Height: 5' (1.524 m)  Weight: 230 lb 3.2 oz (104.418 kg)    Gen: resting comfortably, no acute distress HEENT: no scleral icterus, pupils equal round and reactive, no palptable cervical adenopathy,  CV: RRR, no m/r/g, no JVD Resp: Clear to auscultation bilaterally GI: abdomen is soft, non-tender, non-distended, normal bowel sounds, no hepatosplenomegaly MSK: extremities are warm, no edema.  Skin: warm, no rash Neuro:  no focal deficits Psych: appropriate affect   Diagnostic Studies Echo 2008 FINDINGS:  1. Technically suboptimal, but adequate echocardiographic study.  2. Normal left atrium, right atrium and right ventricle.  3. Normal  proximal ascending aorta.  4. Normal aortic and mitral valves.  5. Tricuspid valve not well seen, but is grossly normal; physiologic  regurgitation. Inadequate visualization of the pulmonic valve and  proximal pulmonary artery.  6. Mild left ventricular dilatation; no hypertrophy; normal regional  and global function.  7. Normal IVC.  8. Comparison with prior study of October 07, 2003, shows there has been  interval left ventricular dilatation.    Assessment and Plan   1. HTN - at goal, continue current meds  2. OSA - continue CPAP  3. Hyperlipidemia - intolerant to statins. Most recent panel at goal, will continue current  meds   F/u 1 year  Antoine Poche, M.D.

## 2015-01-05 DIAGNOSIS — E119 Type 2 diabetes mellitus without complications: Secondary | ICD-10-CM | POA: Diagnosis not present

## 2015-01-05 DIAGNOSIS — H40013 Open angle with borderline findings, low risk, bilateral: Secondary | ICD-10-CM | POA: Diagnosis not present

## 2015-01-05 DIAGNOSIS — H01021 Squamous blepharitis right upper eyelid: Secondary | ICD-10-CM | POA: Diagnosis not present

## 2015-01-05 DIAGNOSIS — Z961 Presence of intraocular lens: Secondary | ICD-10-CM | POA: Diagnosis not present

## 2015-01-05 DIAGNOSIS — H04123 Dry eye syndrome of bilateral lacrimal glands: Secondary | ICD-10-CM | POA: Diagnosis not present

## 2015-01-14 ENCOUNTER — Encounter (HOSPITAL_COMMUNITY): Payer: Self-pay

## 2015-01-14 ENCOUNTER — Emergency Department (HOSPITAL_COMMUNITY)
Admission: EM | Admit: 2015-01-14 | Discharge: 2015-01-14 | Disposition: A | Discharge status: Home / Self Care | Payer: Medicare Other | Attending: Emergency Medicine | Admitting: Emergency Medicine

## 2015-01-14 DIAGNOSIS — E669 Obesity, unspecified: Secondary | ICD-10-CM | POA: Diagnosis not present

## 2015-01-14 DIAGNOSIS — N189 Chronic kidney disease, unspecified: Secondary | ICD-10-CM | POA: Diagnosis not present

## 2015-01-14 DIAGNOSIS — J45909 Unspecified asthma, uncomplicated: Secondary | ICD-10-CM | POA: Diagnosis not present

## 2015-01-14 DIAGNOSIS — R109 Unspecified abdominal pain: Secondary | ICD-10-CM | POA: Diagnosis present

## 2015-01-14 DIAGNOSIS — G8929 Other chronic pain: Secondary | ICD-10-CM | POA: Insufficient documentation

## 2015-01-14 DIAGNOSIS — I480 Paroxysmal atrial fibrillation: Secondary | ICD-10-CM | POA: Diagnosis not present

## 2015-01-14 DIAGNOSIS — E785 Hyperlipidemia, unspecified: Secondary | ICD-10-CM | POA: Insufficient documentation

## 2015-01-14 DIAGNOSIS — M810 Age-related osteoporosis without current pathological fracture: Secondary | ICD-10-CM | POA: Diagnosis not present

## 2015-01-14 DIAGNOSIS — K5792 Diverticulitis of intestine, part unspecified, without perforation or abscess without bleeding: Secondary | ICD-10-CM

## 2015-01-14 DIAGNOSIS — Z79899 Other long term (current) drug therapy: Secondary | ICD-10-CM | POA: Diagnosis not present

## 2015-01-14 DIAGNOSIS — R1032 Left lower quadrant pain: Secondary | ICD-10-CM

## 2015-01-14 DIAGNOSIS — Z7982 Long term (current) use of aspirin: Secondary | ICD-10-CM | POA: Diagnosis not present

## 2015-01-14 DIAGNOSIS — Z8669 Personal history of other diseases of the nervous system and sense organs: Secondary | ICD-10-CM | POA: Insufficient documentation

## 2015-01-14 DIAGNOSIS — I129 Hypertensive chronic kidney disease with stage 1 through stage 4 chronic kidney disease, or unspecified chronic kidney disease: Secondary | ICD-10-CM | POA: Diagnosis not present

## 2015-01-14 DIAGNOSIS — K5790 Diverticulosis of intestine, part unspecified, without perforation or abscess without bleeding: Secondary | ICD-10-CM | POA: Insufficient documentation

## 2015-01-14 LAB — COMPREHENSIVE METABOLIC PANEL
ALT: 21 U/L (ref 14–54)
AST: 26 U/L (ref 15–41)
Albumin: 3.6 g/dL (ref 3.5–5.0)
Alkaline Phosphatase: 34 U/L — ABNORMAL LOW (ref 38–126)
Anion gap: 7 (ref 5–15)
BUN: 44 mg/dL — ABNORMAL HIGH (ref 6–20)
CO2: 25 mmol/L (ref 22–32)
Calcium: 9.2 mg/dL (ref 8.9–10.3)
Chloride: 105 mmol/L (ref 101–111)
Creatinine, Ser: 1.22 mg/dL — ABNORMAL HIGH (ref 0.44–1.00)
GFR calc Af Amer: 48 mL/min — ABNORMAL LOW (ref >60)
GFR calc non Af Amer: 41 mL/min — ABNORMAL LOW (ref >60)
Glucose, Bld: 132 mg/dL — ABNORMAL HIGH (ref 65–99)
Potassium: 3.7 mmol/L (ref 3.5–5.1)
Sodium: 137 mmol/L (ref 135–145)
Total Bilirubin: 0.6 mg/dL (ref 0.3–1.2)
Total Protein: 6.5 g/dL (ref 6.5–8.1)

## 2015-01-14 LAB — CBC WITH DIFFERENTIAL/PLATELET
Basophils Absolute: 0 10*3/uL (ref 0.0–0.1)
Basophils Relative: 0 % (ref 0–1)
Eosinophils Absolute: 0.1 10*3/uL (ref 0.0–0.7)
Eosinophils Relative: 1 % (ref 0–5)
HCT: 41.3 % (ref 36.0–46.0)
Hemoglobin: 13.1 g/dL (ref 12.0–15.0)
Lymphocytes Relative: 3 % — ABNORMAL LOW (ref 12–46)
Lymphs Abs: 0.3 10*3/uL — ABNORMAL LOW (ref 0.7–4.0)
MCH: 29 pg (ref 26.0–34.0)
MCHC: 31.7 g/dL (ref 30.0–36.0)
MCV: 91.6 fL (ref 78.0–100.0)
Monocytes Absolute: 0.1 10*3/uL (ref 0.1–1.0)
Monocytes Relative: 1 % — ABNORMAL LOW (ref 3–12)
Neutro Abs: 12.2 10*3/uL — ABNORMAL HIGH (ref 1.7–7.7)
Neutrophils Relative %: 95 % — ABNORMAL HIGH (ref 43–77)
Platelets: 211 10*3/uL (ref 150–400)
RBC: 4.51 MIL/uL (ref 3.87–5.11)
RDW: 15.7 % — ABNORMAL HIGH (ref 11.5–15.5)
WBC: 12.7 10*3/uL — ABNORMAL HIGH (ref 4.0–10.5)

## 2015-01-14 LAB — LIPASE, BLOOD: Lipase: 53 U/L — ABNORMAL HIGH (ref 22–51)

## 2015-01-14 MED ORDER — TRAMADOL HCL 50 MG PO TABS
50.0000 mg | ORAL_TABLET | Freq: Four times a day (QID) | ORAL | Status: DC | PRN
Start: 2015-01-14 — End: 2019-03-06

## 2015-01-14 MED ORDER — SODIUM CHLORIDE 0.9 % IV BOLUS (SEPSIS)
1000.0000 mL | Freq: Once | INTRAVENOUS | Status: AC
  Administered 2015-01-14: 1000 mL via INTRAVENOUS

## 2015-01-14 MED ORDER — SULFAMETHOXAZOLE-TRIMETHOPRIM 800-160 MG PO TABS
1.0000 | ORAL_TABLET | Freq: Once | ORAL | Status: AC
  Administered 2015-01-14: 1 via ORAL
  Filled 2015-01-14: qty 1

## 2015-01-14 MED ORDER — CEFTRIAXONE SODIUM 1 G IJ SOLR
1.0000 g | Freq: Once | INTRAMUSCULAR | Status: AC
  Administered 2015-01-14: 1 g via INTRAVENOUS
  Filled 2015-01-14: qty 10

## 2015-01-14 MED ORDER — FENTANYL CITRATE (PF) 100 MCG/2ML IJ SOLN
75.0000 ug | Freq: Once | INTRAMUSCULAR | Status: AC
  Administered 2015-01-14: 75 ug via INTRAVENOUS
  Filled 2015-01-14: qty 2

## 2015-01-14 MED ORDER — ONDANSETRON HCL 4 MG/2ML IJ SOLN
4.0000 mg | Freq: Once | INTRAMUSCULAR | Status: AC
Start: 2015-01-14 — End: 2015-01-14
  Administered 2015-01-14: 4 mg via INTRAVENOUS
  Filled 2015-01-14: qty 2

## 2015-01-14 MED ORDER — SODIUM CHLORIDE 0.9 % IV SOLN: INTRAVENOUS | Status: DC

## 2015-01-14 MED ORDER — METRONIDAZOLE IN NACL 5-0.79 MG/ML-% IV SOLN
500.0000 mg | Freq: Once | INTRAVENOUS | Status: AC
  Administered 2015-01-14: 500 mg via INTRAVENOUS
  Filled 2015-01-14: qty 100

## 2015-01-14 MED ORDER — AMOXICILLIN-POT CLAVULANATE 875-125 MG PO TABS: 1.0000 | ORAL_TABLET | Freq: Two times a day (BID) | ORAL | Status: DC

## 2015-01-14 MED ORDER — SODIUM CHLORIDE 0.9 % IV SOLN
1000.0000 mL | Freq: Once | INTRAVENOUS | Status: AC
  Administered 2015-01-14: 1000 mL via INTRAVENOUS

## 2015-01-14 NOTE — ED Provider Notes (Signed)
CSN: [id number]     Arrival date & time 01/14/15  0220 History   First MD Initiated Contact with Patient 01/14/15 0258     Chief Complaint  Patient presents with  . Abdominal Pain     (Consider location/radiation/quality/duration/timing/severity/associated sxs/prior Treatment) HPI   81 year old female with abdominal pain. Patient has been having some mild pain for over the past week. Has become progressively worse in the past 24 hours. Pain is in the left abdomen. Constant. Worse with movement. Patient has a past history of diverticulitis. She feels like current symptoms feel similar to this. No urinary complaints. No fevers or chills. Mild nausea. No vomiting. Last bowel movement yesterday and was formed. No blood in it. Surgical history significant for cholecystectomy.  Past Medical History  Diagnosis Date  . Breast mass     Left  . Dyspnea on exertion 2010  . Sleep apnea 2010    Severe  . Obesity   . Syncope 06/2011  . Osteoporosis   . Diverticular disease     acute diverticulitis in 02/2010  . Gastroesophageal reflux disease     hiatal hernia  . Asthma   . Paroxysmal atrial fibrillation   . Hypertension     Lab  10/2011: Normal CMet except GFR of 50, normal microalbumin, normal CBC, uric acid of 7.9, A1c of 6.1, normal urinalysis, low vitamin D; negative stress nuclear study in 2008  . Hyperlipidemia     Lipid profile 10/2011:240, 132, 52, 162  . Chronic low back pain   . Urinary incontinence     Interstitial cystitis  . Chronic kidney disease     Borderline with serum creatinine at the upper limit of normal in 10/2011   Past Surgical History  Procedure Laterality Date  . Lumbar laminectomy  1992  . Cholecystectomy  1993  . Dilation and curettage of uterus  1986  . Cataract extraction    . Cholecystectomy  1993  . Tubal ligation    . Colonoscopy  2008    Negative screening study  . Orthopedic surgery     Family History  Problem Relation Age of Onset  . Heart  attack Mother   . Asthma Father   . Diabetes Father   . Heart failure Father   . Colitis Sister   . Heart failure Brother   . Pancreatic cancer Brother   . Diabetes Sister   . Transient ischemic attack Sister    History  Substance Use Topics  . Smoking status: Never Smoker   . Smokeless tobacco: Never Used  . Alcohol Use: No   OB History    No data available     Review of Systems  All systems reviewed and negative, other than as noted in HPI.   Allergies  Ace inhibitors; Ciprofloxacin; Hydrocodone-acetaminophen; Lipitor; Monopril; Morphine and related; Nitrofurantoin monohyd macro; Percocet; Statins; Flagyl; and Phenazopyridine  Home Medications   Prior to Admission medications   Medication Sig Start Date End Date Taking? Authorizing Provider  allopurinol (ZYLOPRIM) 100 MG tablet Take 100 mg by mouth 2 (two) times daily.   Yes Historical Provider, MD  aspirin 81 MG tablet Take 81 mg by mouth 2 (two) times daily.    Yes Historical Provider, MD  bumetanide (BUMEX) 1 MG tablet Take 1 mg by mouth daily.     Yes Historical Provider, MD  cholecalciferol (VITAMIN D) 1000 UNITS tablet Take 1,000 Units by mouth daily.     Yes Historical Provider, MD  Choline Fenofibrate (TRILIPIX)  135 MG capsule Take 135 mg by mouth daily.     Yes Historical Provider, MD  colesevelam (WELCHOL) 625 MG tablet Take 1,875 mg by mouth 2 (two) times daily with a meal. Take 3 tablets twice daily   Yes Historical Provider, MD  docusate sodium (COLACE) 100 MG capsule Take 200 mg by mouth 2 (two) times daily.   Yes Historical Provider, MD  losartan (COZAAR) 100 MG tablet TAKE (1) TABLET BY MOUTH ONCE DAILY. 05/18/13  Yes Jodelle GrossKathryn M Lawrence, NP  metoprolol tartrate (LOPRESSOR) 25 MG tablet Take 25 mg by mouth 2 (two) times daily.     Yes Historical Provider, MD  Omega-3 Fatty Acids (SALMON OIL PO) Take 1,000 mg by mouth 2 (two) times daily.    Yes Historical Provider, MD  polyethylene glycol (MIRALAX / GLYCOLAX)  packet Take 17 g by mouth daily.   Yes Historical Provider, MD  potassium chloride (KLOR-CON) 20 MEQ packet Take 20 mEq by mouth daily.     Yes Historical Provider, MD  amoxicillin-clavulanate (AUGMENTIN) 875-125 MG per tablet Take 1 tablet by mouth 2 (two) times daily. 01/14/15   Raeford RazorStephen Jamarco Zaldivar, MD  traMADol (ULTRAM) 50 MG tablet Take 1 tablet (50 mg total) by mouth every 6 (six) hours as needed. 01/14/15   Raeford RazorStephen Jaquaveon Bilal, MD   BP 95/51 mmHg  Pulse 88  Temp(Src) 98 F (36.7 C) (Oral)  Resp 22  Ht 5' (1.524 m)  Wt 232 lb (105.235 kg)  BMI 45.31 kg/m2  SpO2 94% Physical Exam  Constitutional: She appears well-developed and well-nourished. No distress.  Laying in bed. No acute distress.  HENT:  Head: Normocephalic and atraumatic.  Eyes: Conjunctivae are normal. Right eye exhibits no discharge. Left eye exhibits no discharge.  Neck: Neck supple.  Cardiovascular: Normal rate, regular rhythm and normal heart sounds.  Exam reveals no gallop and no friction rub.   No murmur heard. Pulmonary/Chest: Effort normal and breath sounds normal. No respiratory distress.  Abdominal: Soft. She exhibits no distension. There is no tenderness.  Obese abdomen. Left-sided abdominal tenderness, maximal left lower quadrant. No rebound or guarding. Does not appear to be distended.  Musculoskeletal: She exhibits no edema or tenderness.  Neurological: She is alert.  Skin: Skin is warm and dry.  Psychiatric: She has a normal mood and affect. Her behavior is normal. Thought content normal.  Nursing note and vitals reviewed.   ED Course  Procedures (including critical care time) Labs Review Labs Reviewed  CBC WITH DIFFERENTIAL/PLATELET - Abnormal; Notable for the following:    WBC 12.7 (*)    RDW 15.7 (*)    Neutrophils Relative % 95 (*)    Neutro Abs 12.2 (*)    Lymphocytes Relative 3 (*)    Lymphs Abs 0.3 (*)    Monocytes Relative 1 (*)    All other components within normal limits  COMPREHENSIVE METABOLIC  PANEL - Abnormal; Notable for the following:    Glucose, Bld 132 (*)    BUN 44 (*)    Creatinine, Ser 1.22 (*)    Alkaline Phosphatase 34 (*)    GFR calc non Af Amer 41 (*)    GFR calc Af Amer 48 (*)    All other components within normal limits  LIPASE, BLOOD - Abnormal; Notable for the following:    Lipase 53 (*)    All other components within normal limits  URINE CULTURE    Imaging Review No results found.   EKG Interpretation None  MDM   Final diagnoses:  Left lower quadrant pain  Diverticulitis of intestine without perforation or abscess without bleeding    This 81 year old female with left-sided abdominal pain. Patient has a past history of diverticulitis. Current symptoms feel similar to that. She does have left lower quadrant abdominal tenderness on my exam. No peritoneal signs. Low suspicion for perforation, abscess or obstruction. Discussed presumptive treatment for diverticulitis versus CT. Patient is comfortable with presumptive treatment at this time. Quinolone and metronidazole allergies. She was placed on Augmentin at discharge. When necessary pain medication. Return precautions were discussed.    Raeford Razor, MD 01/14/15 [phone]

## 2015-01-14 NOTE — ED Notes (Signed)
Patient and family verbalize understanding of discharge instructions, prescription medications, home care and follow up care. Patient out of department at this time with family. 

## 2015-01-14 NOTE — ED Notes (Signed)
Patient c/o abdominal pain that started last night after eating dinner. Patient states she has a history of diverticulitis and states she is having a flair up. C/o nausea no vomiting no diarrhea.

## 2015-01-14 NOTE — Discharge Instructions (Signed)

## 2015-02-21 DIAGNOSIS — I10 Essential (primary) hypertension: Secondary | ICD-10-CM | POA: Diagnosis not present

## 2015-02-21 DIAGNOSIS — E1165 Type 2 diabetes mellitus with hyperglycemia: Secondary | ICD-10-CM | POA: Diagnosis not present

## 2015-02-28 DIAGNOSIS — I10 Essential (primary) hypertension: Secondary | ICD-10-CM | POA: Diagnosis not present

## 2015-02-28 DIAGNOSIS — E1122 Type 2 diabetes mellitus with diabetic chronic kidney disease: Secondary | ICD-10-CM | POA: Diagnosis not present

## 2015-02-28 DIAGNOSIS — N183 Chronic kidney disease, stage 3 (moderate): Secondary | ICD-10-CM | POA: Diagnosis not present

## 2015-02-28 DIAGNOSIS — Z6841 Body Mass Index (BMI) 40.0 and over, adult: Secondary | ICD-10-CM | POA: Diagnosis not present

## 2015-03-04 ENCOUNTER — Ambulatory Visit (HOSPITAL_COMMUNITY)
Admission: RE | Admit: 2015-03-04 | Discharge: 2015-03-04 | Disposition: A | Discharge status: Home / Self Care | Payer: Medicare Other | Source: Ambulatory Visit | Attending: Internal Medicine | Admitting: Internal Medicine

## 2015-03-04 ENCOUNTER — Other Ambulatory Visit (HOSPITAL_COMMUNITY): Payer: Self-pay | Admitting: Internal Medicine

## 2015-03-04 DIAGNOSIS — R1031 Right lower quadrant pain: Secondary | ICD-10-CM | POA: Insufficient documentation

## 2015-03-04 DIAGNOSIS — R1084 Generalized abdominal pain: Secondary | ICD-10-CM | POA: Diagnosis not present

## 2015-03-07 DIAGNOSIS — R1084 Generalized abdominal pain: Secondary | ICD-10-CM | POA: Diagnosis not present

## 2015-04-06 DIAGNOSIS — Z23 Encounter for immunization: Secondary | ICD-10-CM | POA: Diagnosis not present

## 2015-04-21 DIAGNOSIS — G4733 Obstructive sleep apnea (adult) (pediatric): Secondary | ICD-10-CM | POA: Diagnosis not present

## 2015-06-10 DIAGNOSIS — G4733 Obstructive sleep apnea (adult) (pediatric): Secondary | ICD-10-CM | POA: Diagnosis not present

## 2015-06-24 DIAGNOSIS — I10 Essential (primary) hypertension: Secondary | ICD-10-CM | POA: Diagnosis not present

## 2015-06-24 DIAGNOSIS — E1122 Type 2 diabetes mellitus with diabetic chronic kidney disease: Secondary | ICD-10-CM | POA: Diagnosis not present

## 2015-06-29 DIAGNOSIS — E1122 Type 2 diabetes mellitus with diabetic chronic kidney disease: Secondary | ICD-10-CM | POA: Diagnosis not present

## 2015-06-29 DIAGNOSIS — I10 Essential (primary) hypertension: Secondary | ICD-10-CM | POA: Diagnosis not present

## 2015-06-29 DIAGNOSIS — E782 Mixed hyperlipidemia: Secondary | ICD-10-CM | POA: Diagnosis not present

## 2015-06-29 DIAGNOSIS — N183 Chronic kidney disease, stage 3 (moderate): Secondary | ICD-10-CM | POA: Diagnosis not present

## 2015-07-04 DIAGNOSIS — Z23 Encounter for immunization: Secondary | ICD-10-CM | POA: Diagnosis not present

## 2015-07-04 DIAGNOSIS — J00 Acute nasopharyngitis [common cold]: Secondary | ICD-10-CM | POA: Diagnosis not present

## 2015-07-30 DIAGNOSIS — G4733 Obstructive sleep apnea (adult) (pediatric): Secondary | ICD-10-CM | POA: Diagnosis not present

## 2015-10-04 DIAGNOSIS — R1084 Generalized abdominal pain: Secondary | ICD-10-CM | POA: Diagnosis not present

## 2015-10-18 DIAGNOSIS — E1122 Type 2 diabetes mellitus with diabetic chronic kidney disease: Secondary | ICD-10-CM | POA: Diagnosis not present

## 2015-10-20 DIAGNOSIS — K219 Gastro-esophageal reflux disease without esophagitis: Secondary | ICD-10-CM | POA: Diagnosis not present

## 2015-10-20 DIAGNOSIS — D509 Iron deficiency anemia, unspecified: Secondary | ICD-10-CM | POA: Diagnosis not present

## 2015-10-20 DIAGNOSIS — E1122 Type 2 diabetes mellitus with diabetic chronic kidney disease: Secondary | ICD-10-CM | POA: Diagnosis not present

## 2015-10-22 DIAGNOSIS — G4733 Obstructive sleep apnea (adult) (pediatric): Secondary | ICD-10-CM | POA: Diagnosis not present

## 2016-01-10 DIAGNOSIS — E119 Type 2 diabetes mellitus without complications: Secondary | ICD-10-CM | POA: Diagnosis not present

## 2016-01-10 DIAGNOSIS — H01024 Squamous blepharitis left upper eyelid: Secondary | ICD-10-CM | POA: Diagnosis not present

## 2016-01-10 DIAGNOSIS — H40013 Open angle with borderline findings, low risk, bilateral: Secondary | ICD-10-CM | POA: Diagnosis not present

## 2016-01-10 DIAGNOSIS — H01022 Squamous blepharitis right lower eyelid: Secondary | ICD-10-CM | POA: Diagnosis not present

## 2016-01-10 DIAGNOSIS — H01021 Squamous blepharitis right upper eyelid: Secondary | ICD-10-CM | POA: Diagnosis not present

## 2016-01-20 DIAGNOSIS — G4733 Obstructive sleep apnea (adult) (pediatric): Secondary | ICD-10-CM | POA: Diagnosis not present

## 2016-03-06 DIAGNOSIS — E782 Mixed hyperlipidemia: Secondary | ICD-10-CM | POA: Diagnosis not present

## 2016-03-06 DIAGNOSIS — R7301 Impaired fasting glucose: Secondary | ICD-10-CM | POA: Diagnosis not present

## 2016-03-08 DIAGNOSIS — G4733 Obstructive sleep apnea (adult) (pediatric): Secondary | ICD-10-CM | POA: Diagnosis not present

## 2016-03-08 DIAGNOSIS — N183 Chronic kidney disease, stage 3 (moderate): Secondary | ICD-10-CM | POA: Diagnosis not present

## 2016-03-08 DIAGNOSIS — E782 Mixed hyperlipidemia: Secondary | ICD-10-CM | POA: Diagnosis not present

## 2016-03-08 DIAGNOSIS — E1122 Type 2 diabetes mellitus with diabetic chronic kidney disease: Secondary | ICD-10-CM | POA: Diagnosis not present

## 2016-03-08 DIAGNOSIS — I10 Essential (primary) hypertension: Secondary | ICD-10-CM | POA: Diagnosis not present

## 2016-04-26 DIAGNOSIS — Z23 Encounter for immunization: Secondary | ICD-10-CM | POA: Diagnosis not present

## 2016-05-18 DIAGNOSIS — G4733 Obstructive sleep apnea (adult) (pediatric): Secondary | ICD-10-CM | POA: Diagnosis not present

## 2016-06-21 DIAGNOSIS — I10 Essential (primary) hypertension: Secondary | ICD-10-CM | POA: Diagnosis not present

## 2016-06-21 DIAGNOSIS — E1122 Type 2 diabetes mellitus with diabetic chronic kidney disease: Secondary | ICD-10-CM | POA: Diagnosis not present

## 2016-06-21 DIAGNOSIS — D509 Iron deficiency anemia, unspecified: Secondary | ICD-10-CM | POA: Diagnosis not present

## 2016-06-25 DIAGNOSIS — I10 Essential (primary) hypertension: Secondary | ICD-10-CM | POA: Diagnosis not present

## 2016-06-25 DIAGNOSIS — B0222 Postherpetic trigeminal neuralgia: Secondary | ICD-10-CM | POA: Diagnosis not present

## 2016-06-25 DIAGNOSIS — Z0001 Encounter for general adult medical examination with abnormal findings: Secondary | ICD-10-CM | POA: Diagnosis not present

## 2016-06-25 DIAGNOSIS — N183 Chronic kidney disease, stage 3 (moderate): Secondary | ICD-10-CM | POA: Diagnosis not present

## 2016-06-25 DIAGNOSIS — E1122 Type 2 diabetes mellitus with diabetic chronic kidney disease: Secondary | ICD-10-CM | POA: Diagnosis not present

## 2016-06-25 DIAGNOSIS — E782 Mixed hyperlipidemia: Secondary | ICD-10-CM | POA: Diagnosis not present

## 2016-07-19 DIAGNOSIS — G4733 Obstructive sleep apnea (adult) (pediatric): Secondary | ICD-10-CM | POA: Diagnosis not present

## 2016-07-23 DIAGNOSIS — I10 Essential (primary) hypertension: Secondary | ICD-10-CM | POA: Diagnosis not present

## 2016-07-23 DIAGNOSIS — J060 Acute laryngopharyngitis: Secondary | ICD-10-CM | POA: Diagnosis not present

## 2016-07-23 DIAGNOSIS — F331 Major depressive disorder, recurrent, moderate: Secondary | ICD-10-CM | POA: Diagnosis not present

## 2016-08-31 DIAGNOSIS — G4733 Obstructive sleep apnea (adult) (pediatric): Secondary | ICD-10-CM | POA: Diagnosis not present

## 2016-09-13 DIAGNOSIS — G4733 Obstructive sleep apnea (adult) (pediatric): Secondary | ICD-10-CM | POA: Diagnosis not present

## 2016-10-24 DIAGNOSIS — E782 Mixed hyperlipidemia: Secondary | ICD-10-CM | POA: Diagnosis not present

## 2016-10-24 DIAGNOSIS — E1122 Type 2 diabetes mellitus with diabetic chronic kidney disease: Secondary | ICD-10-CM | POA: Diagnosis not present

## 2016-10-24 DIAGNOSIS — I10 Essential (primary) hypertension: Secondary | ICD-10-CM | POA: Diagnosis not present

## 2016-10-26 DIAGNOSIS — F330 Major depressive disorder, recurrent, mild: Secondary | ICD-10-CM | POA: Diagnosis not present

## 2016-10-26 DIAGNOSIS — E1122 Type 2 diabetes mellitus with diabetic chronic kidney disease: Secondary | ICD-10-CM | POA: Diagnosis not present

## 2016-10-26 DIAGNOSIS — M79671 Pain in right foot: Secondary | ICD-10-CM | POA: Diagnosis not present

## 2016-10-26 DIAGNOSIS — I10 Essential (primary) hypertension: Secondary | ICD-10-CM | POA: Diagnosis not present

## 2016-10-26 DIAGNOSIS — N183 Chronic kidney disease, stage 3 (moderate): Secondary | ICD-10-CM | POA: Diagnosis not present

## 2016-10-26 DIAGNOSIS — E782 Mixed hyperlipidemia: Secondary | ICD-10-CM | POA: Diagnosis not present

## 2016-10-26 DIAGNOSIS — R682 Dry mouth, unspecified: Secondary | ICD-10-CM | POA: Diagnosis not present

## 2016-10-26 DIAGNOSIS — B0222 Postherpetic trigeminal neuralgia: Secondary | ICD-10-CM | POA: Diagnosis not present

## 2016-11-15 DIAGNOSIS — G4733 Obstructive sleep apnea (adult) (pediatric): Secondary | ICD-10-CM | POA: Diagnosis not present

## 2016-11-20 DIAGNOSIS — G4733 Obstructive sleep apnea (adult) (pediatric): Secondary | ICD-10-CM | POA: Diagnosis not present

## 2016-12-13 DIAGNOSIS — H01024 Squamous blepharitis left upper eyelid: Secondary | ICD-10-CM | POA: Diagnosis not present

## 2016-12-13 DIAGNOSIS — H02831 Dermatochalasis of right upper eyelid: Secondary | ICD-10-CM | POA: Diagnosis not present

## 2016-12-13 DIAGNOSIS — H01021 Squamous blepharitis right upper eyelid: Secondary | ICD-10-CM | POA: Diagnosis not present

## 2016-12-13 DIAGNOSIS — H04123 Dry eye syndrome of bilateral lacrimal glands: Secondary | ICD-10-CM | POA: Diagnosis not present

## 2016-12-13 DIAGNOSIS — H40013 Open angle with borderline findings, low risk, bilateral: Secondary | ICD-10-CM | POA: Diagnosis not present

## 2016-12-13 DIAGNOSIS — H01025 Squamous blepharitis left lower eyelid: Secondary | ICD-10-CM | POA: Diagnosis not present

## 2016-12-13 DIAGNOSIS — H01022 Squamous blepharitis right lower eyelid: Secondary | ICD-10-CM | POA: Diagnosis not present

## 2016-12-13 DIAGNOSIS — H02834 Dermatochalasis of left upper eyelid: Secondary | ICD-10-CM | POA: Diagnosis not present

## 2016-12-13 DIAGNOSIS — Z961 Presence of intraocular lens: Secondary | ICD-10-CM | POA: Diagnosis not present

## 2016-12-31 ENCOUNTER — Encounter: Payer: Self-pay | Admitting: *Deleted

## 2016-12-31 ENCOUNTER — Other Ambulatory Visit: Payer: Self-pay | Admitting: *Deleted

## 2016-12-31 NOTE — Patient Outreach (Signed)
Pt sees primary care MD routinely. Takes medications as ordered for chronic problems. No acute health concerns. No care management needs at present but they could use pharmacy assistace when they get to the "donut hole."  Introductory letter sent with special remarks regarding to call me when she or her husband are anticipating going into the donut hole.  Stefanie Councilarroll C. Burgess EstelleSpinks, MSN, [facility] Healthcare[facility]NP-BC Gerontological Nurse Practitioner [facility] Care Management [phone]

## 2017-01-15 ENCOUNTER — Other Ambulatory Visit: Payer: Self-pay | Admitting: *Deleted

## 2017-01-15 NOTE — Patient Outreach (Signed)
Incoming call. Stefanie [name] called me today, stating she had received the letter I sent her. She said she believes she will go into the donut hole this month. I have advised her that I will refer her to our pharmacy team to assist her. I reviewed her medication list again. She states the Welchol is the medication that will cost a lot if she has to pay out of pocket for it. I will forward this information to our pharmacy team.  I thanked her for allowing us to assist her.  Zara Councilarroll C. Burgess EstelleSpinks, MSN,  [facility]NP-BC Gerontological Nurse Practitioner [facility] Care Management [phone]

## 2017-01-28 ENCOUNTER — Other Ambulatory Visit: Payer: Self-pay | Admitting: Pharmacist

## 2017-01-28 NOTE — Patient Outreach (Signed)
[facility] Care Management  01/28/2017  Stefanie [name] 10/09/1932 [id number]  [facility] by [facility]NP Noralyn Pickarroll, for medication assistance evaluation for [facility].   Successful phone outreach to patient, HIPAA details verified.   Patient reports she has tried multiple cholesterol medications in the past and has had tolerability issues.  She reports she is close to entering the coverage gap on her Medicare Part D plan.   Placed a call to [facility] and was told Medicare Part D beneficiaries are not able to apply to manufacturer patient assistance program.  P[employer] Foundation program for hypercholesterolemia is currently closed.    Discussed with patient there aren't many options for patient assistance with Welchol.  She believes household income exceeds [facility] Extra Help requirements.  Discussed with patient she can periodically call back to have [facility] Pharmacist check if PAN Foundation accepts applications again---she was counseled [facility] Pharmacist has no way of knowing if/when PAN Foundation will accept applications again.   Plan:  Patient appreciated call, unfortunately, no manufacturer assistance from [employer] for [place].    She is aware she can contact [facility] Pharmacist if new questions/concerns arise.   Will route note to PCP to make aware.    Tommye StandardKevin Kaige Whistler, PharmD, [facility] Clinical Pharmacist [facility] [phone]

## 2017-02-05 DIAGNOSIS — G4733 Obstructive sleep apnea (adult) (pediatric): Secondary | ICD-10-CM | POA: Diagnosis not present

## 2017-03-27 DIAGNOSIS — D509 Iron deficiency anemia, unspecified: Secondary | ICD-10-CM | POA: Diagnosis not present

## 2017-03-27 DIAGNOSIS — I10 Essential (primary) hypertension: Secondary | ICD-10-CM | POA: Diagnosis not present

## 2017-03-27 DIAGNOSIS — E1122 Type 2 diabetes mellitus with diabetic chronic kidney disease: Secondary | ICD-10-CM | POA: Diagnosis not present

## 2017-04-02 DIAGNOSIS — M79671 Pain in right foot: Secondary | ICD-10-CM | POA: Diagnosis not present

## 2017-04-02 DIAGNOSIS — F330 Major depressive disorder, recurrent, mild: Secondary | ICD-10-CM | POA: Diagnosis not present

## 2017-04-02 DIAGNOSIS — R682 Dry mouth, unspecified: Secondary | ICD-10-CM | POA: Diagnosis not present

## 2017-04-02 DIAGNOSIS — N183 Chronic kidney disease, stage 3 (moderate): Secondary | ICD-10-CM | POA: Diagnosis not present

## 2017-04-02 DIAGNOSIS — Z23 Encounter for immunization: Secondary | ICD-10-CM | POA: Diagnosis not present

## 2017-04-02 DIAGNOSIS — B0222 Postherpetic trigeminal neuralgia: Secondary | ICD-10-CM | POA: Diagnosis not present

## 2017-04-02 DIAGNOSIS — E1122 Type 2 diabetes mellitus with diabetic chronic kidney disease: Secondary | ICD-10-CM | POA: Diagnosis not present

## 2017-04-02 DIAGNOSIS — I10 Essential (primary) hypertension: Secondary | ICD-10-CM | POA: Diagnosis not present

## 2017-05-20 DIAGNOSIS — G4733 Obstructive sleep apnea (adult) (pediatric): Secondary | ICD-10-CM | POA: Diagnosis not present

## 2017-06-12 DIAGNOSIS — G4733 Obstructive sleep apnea (adult) (pediatric): Secondary | ICD-10-CM | POA: Diagnosis not present

## 2017-07-05 DIAGNOSIS — G4733 Obstructive sleep apnea (adult) (pediatric): Secondary | ICD-10-CM | POA: Diagnosis not present

## 2017-08-05 DIAGNOSIS — E782 Mixed hyperlipidemia: Secondary | ICD-10-CM | POA: Diagnosis not present

## 2017-08-05 DIAGNOSIS — R2689 Other abnormalities of gait and mobility: Secondary | ICD-10-CM | POA: Diagnosis not present

## 2017-08-05 DIAGNOSIS — D509 Iron deficiency anemia, unspecified: Secondary | ICD-10-CM | POA: Diagnosis not present

## 2017-08-05 DIAGNOSIS — K219 Gastro-esophageal reflux disease without esophagitis: Secondary | ICD-10-CM | POA: Diagnosis not present

## 2017-08-05 DIAGNOSIS — F5101 Primary insomnia: Secondary | ICD-10-CM | POA: Diagnosis not present

## 2017-08-05 DIAGNOSIS — N183 Chronic kidney disease, stage 3 (moderate): Secondary | ICD-10-CM | POA: Diagnosis not present

## 2017-08-05 DIAGNOSIS — G4733 Obstructive sleep apnea (adult) (pediatric): Secondary | ICD-10-CM | POA: Diagnosis not present

## 2017-08-05 DIAGNOSIS — B0222 Postherpetic trigeminal neuralgia: Secondary | ICD-10-CM | POA: Diagnosis not present

## 2017-08-05 DIAGNOSIS — E1122 Type 2 diabetes mellitus with diabetic chronic kidney disease: Secondary | ICD-10-CM | POA: Diagnosis not present

## 2017-08-05 DIAGNOSIS — F330 Major depressive disorder, recurrent, mild: Secondary | ICD-10-CM | POA: Diagnosis not present

## 2017-08-09 DIAGNOSIS — E782 Mixed hyperlipidemia: Secondary | ICD-10-CM | POA: Diagnosis not present

## 2017-08-09 DIAGNOSIS — R2689 Other abnormalities of gait and mobility: Secondary | ICD-10-CM | POA: Diagnosis not present

## 2017-08-09 DIAGNOSIS — F330 Major depressive disorder, recurrent, mild: Secondary | ICD-10-CM | POA: Diagnosis not present

## 2017-08-09 DIAGNOSIS — D509 Iron deficiency anemia, unspecified: Secondary | ICD-10-CM | POA: Diagnosis not present

## 2017-08-09 DIAGNOSIS — G4733 Obstructive sleep apnea (adult) (pediatric): Secondary | ICD-10-CM | POA: Diagnosis not present

## 2017-08-09 DIAGNOSIS — F5101 Primary insomnia: Secondary | ICD-10-CM | POA: Diagnosis not present

## 2017-08-09 DIAGNOSIS — K219 Gastro-esophageal reflux disease without esophagitis: Secondary | ICD-10-CM | POA: Diagnosis not present

## 2017-08-09 DIAGNOSIS — E1122 Type 2 diabetes mellitus with diabetic chronic kidney disease: Secondary | ICD-10-CM | POA: Diagnosis not present

## 2017-08-09 DIAGNOSIS — B0222 Postherpetic trigeminal neuralgia: Secondary | ICD-10-CM | POA: Diagnosis not present

## 2017-08-09 DIAGNOSIS — N183 Chronic kidney disease, stage 3 (moderate): Secondary | ICD-10-CM | POA: Diagnosis not present

## 2017-08-21 DIAGNOSIS — R2689 Other abnormalities of gait and mobility: Secondary | ICD-10-CM | POA: Diagnosis not present

## 2017-08-21 DIAGNOSIS — M6281 Muscle weakness (generalized): Secondary | ICD-10-CM | POA: Diagnosis not present

## 2017-08-21 DIAGNOSIS — E782 Mixed hyperlipidemia: Secondary | ICD-10-CM | POA: Diagnosis not present

## 2017-08-21 DIAGNOSIS — Z7982 Long term (current) use of aspirin: Secondary | ICD-10-CM | POA: Diagnosis not present

## 2017-08-21 DIAGNOSIS — I129 Hypertensive chronic kidney disease with stage 1 through stage 4 chronic kidney disease, or unspecified chronic kidney disease: Secondary | ICD-10-CM | POA: Diagnosis not present

## 2017-08-21 DIAGNOSIS — N183 Chronic kidney disease, stage 3 (moderate): Secondary | ICD-10-CM | POA: Diagnosis not present

## 2017-08-21 DIAGNOSIS — K219 Gastro-esophageal reflux disease without esophagitis: Secondary | ICD-10-CM | POA: Diagnosis not present

## 2017-08-21 DIAGNOSIS — E1122 Type 2 diabetes mellitus with diabetic chronic kidney disease: Secondary | ICD-10-CM | POA: Diagnosis not present

## 2017-08-21 DIAGNOSIS — G4733 Obstructive sleep apnea (adult) (pediatric): Secondary | ICD-10-CM | POA: Diagnosis not present

## 2017-08-21 DIAGNOSIS — F330 Major depressive disorder, recurrent, mild: Secondary | ICD-10-CM | POA: Diagnosis not present

## 2017-08-21 DIAGNOSIS — D509 Iron deficiency anemia, unspecified: Secondary | ICD-10-CM | POA: Diagnosis not present

## 2017-09-14 DIAGNOSIS — G4733 Obstructive sleep apnea (adult) (pediatric): Secondary | ICD-10-CM | POA: Diagnosis not present

## 2017-10-25 DIAGNOSIS — D509 Iron deficiency anemia, unspecified: Secondary | ICD-10-CM | POA: Diagnosis not present

## 2017-10-25 DIAGNOSIS — E1122 Type 2 diabetes mellitus with diabetic chronic kidney disease: Secondary | ICD-10-CM | POA: Diagnosis not present

## 2017-10-25 DIAGNOSIS — F5101 Primary insomnia: Secondary | ICD-10-CM | POA: Diagnosis not present

## 2017-10-25 DIAGNOSIS — K219 Gastro-esophageal reflux disease without esophagitis: Secondary | ICD-10-CM | POA: Diagnosis not present

## 2017-10-25 DIAGNOSIS — E782 Mixed hyperlipidemia: Secondary | ICD-10-CM | POA: Diagnosis not present

## 2017-10-25 DIAGNOSIS — R2689 Other abnormalities of gait and mobility: Secondary | ICD-10-CM | POA: Diagnosis not present

## 2017-10-25 DIAGNOSIS — B0222 Postherpetic trigeminal neuralgia: Secondary | ICD-10-CM | POA: Diagnosis not present

## 2017-10-25 DIAGNOSIS — F330 Major depressive disorder, recurrent, mild: Secondary | ICD-10-CM | POA: Diagnosis not present

## 2017-10-25 DIAGNOSIS — G4733 Obstructive sleep apnea (adult) (pediatric): Secondary | ICD-10-CM | POA: Diagnosis not present

## 2017-10-25 DIAGNOSIS — N183 Chronic kidney disease, stage 3 (moderate): Secondary | ICD-10-CM | POA: Diagnosis not present

## 2017-10-28 DIAGNOSIS — E1122 Type 2 diabetes mellitus with diabetic chronic kidney disease: Secondary | ICD-10-CM | POA: Diagnosis not present

## 2017-10-28 DIAGNOSIS — K219 Gastro-esophageal reflux disease without esophagitis: Secondary | ICD-10-CM | POA: Diagnosis not present

## 2017-10-28 DIAGNOSIS — B0222 Postherpetic trigeminal neuralgia: Secondary | ICD-10-CM | POA: Diagnosis not present

## 2017-10-28 DIAGNOSIS — N183 Chronic kidney disease, stage 3 (moderate): Secondary | ICD-10-CM | POA: Diagnosis not present

## 2017-10-28 DIAGNOSIS — R600 Localized edema: Secondary | ICD-10-CM | POA: Diagnosis not present

## 2017-10-28 DIAGNOSIS — G4733 Obstructive sleep apnea (adult) (pediatric): Secondary | ICD-10-CM | POA: Diagnosis not present

## 2017-10-28 DIAGNOSIS — R682 Dry mouth, unspecified: Secondary | ICD-10-CM | POA: Diagnosis not present

## 2017-10-28 DIAGNOSIS — E782 Mixed hyperlipidemia: Secondary | ICD-10-CM | POA: Diagnosis not present

## 2017-10-28 DIAGNOSIS — M79671 Pain in right foot: Secondary | ICD-10-CM | POA: Diagnosis not present

## 2017-10-28 DIAGNOSIS — I10 Essential (primary) hypertension: Secondary | ICD-10-CM | POA: Diagnosis not present

## 2017-10-28 DIAGNOSIS — F330 Major depressive disorder, recurrent, mild: Secondary | ICD-10-CM | POA: Diagnosis not present

## 2017-11-20 DIAGNOSIS — G4733 Obstructive sleep apnea (adult) (pediatric): Secondary | ICD-10-CM | POA: Diagnosis not present

## 2017-12-13 DIAGNOSIS — G4733 Obstructive sleep apnea (adult) (pediatric): Secondary | ICD-10-CM | POA: Diagnosis not present

## 2018-01-08 DIAGNOSIS — G4733 Obstructive sleep apnea (adult) (pediatric): Secondary | ICD-10-CM | POA: Diagnosis not present

## 2018-01-13 DIAGNOSIS — Z961 Presence of intraocular lens: Secondary | ICD-10-CM | POA: Diagnosis not present

## 2018-01-13 DIAGNOSIS — H02834 Dermatochalasis of left upper eyelid: Secondary | ICD-10-CM | POA: Diagnosis not present

## 2018-01-13 DIAGNOSIS — H04123 Dry eye syndrome of bilateral lacrimal glands: Secondary | ICD-10-CM | POA: Diagnosis not present

## 2018-01-13 DIAGNOSIS — H02831 Dermatochalasis of right upper eyelid: Secondary | ICD-10-CM | POA: Diagnosis not present

## 2018-01-13 DIAGNOSIS — H01021 Squamous blepharitis right upper eyelid: Secondary | ICD-10-CM | POA: Diagnosis not present

## 2018-01-13 DIAGNOSIS — H01025 Squamous blepharitis left lower eyelid: Secondary | ICD-10-CM | POA: Diagnosis not present

## 2018-01-13 DIAGNOSIS — H40013 Open angle with borderline findings, low risk, bilateral: Secondary | ICD-10-CM | POA: Diagnosis not present

## 2018-01-13 DIAGNOSIS — H01024 Squamous blepharitis left upper eyelid: Secondary | ICD-10-CM | POA: Diagnosis not present

## 2018-01-13 DIAGNOSIS — H01022 Squamous blepharitis right lower eyelid: Secondary | ICD-10-CM | POA: Diagnosis not present

## 2018-02-17 DIAGNOSIS — B079 Viral wart, unspecified: Secondary | ICD-10-CM | POA: Diagnosis not present

## 2018-02-17 DIAGNOSIS — I788 Other diseases of capillaries: Secondary | ICD-10-CM | POA: Diagnosis not present

## 2018-02-17 DIAGNOSIS — L821 Other seborrheic keratosis: Secondary | ICD-10-CM | POA: Diagnosis not present

## 2018-02-27 DIAGNOSIS — E782 Mixed hyperlipidemia: Secondary | ICD-10-CM | POA: Diagnosis not present

## 2018-02-27 DIAGNOSIS — N183 Chronic kidney disease, stage 3 (moderate): Secondary | ICD-10-CM | POA: Diagnosis not present

## 2018-02-27 DIAGNOSIS — R251 Tremor, unspecified: Secondary | ICD-10-CM | POA: Diagnosis not present

## 2018-02-27 DIAGNOSIS — F330 Major depressive disorder, recurrent, mild: Secondary | ICD-10-CM | POA: Diagnosis not present

## 2018-02-27 DIAGNOSIS — K219 Gastro-esophageal reflux disease without esophagitis: Secondary | ICD-10-CM | POA: Diagnosis not present

## 2018-02-27 DIAGNOSIS — G4733 Obstructive sleep apnea (adult) (pediatric): Secondary | ICD-10-CM | POA: Diagnosis not present

## 2018-02-27 DIAGNOSIS — R600 Localized edema: Secondary | ICD-10-CM | POA: Diagnosis not present

## 2018-02-27 DIAGNOSIS — I10 Essential (primary) hypertension: Secondary | ICD-10-CM | POA: Diagnosis not present

## 2018-02-27 DIAGNOSIS — F5101 Primary insomnia: Secondary | ICD-10-CM | POA: Diagnosis not present

## 2018-02-27 DIAGNOSIS — E1122 Type 2 diabetes mellitus with diabetic chronic kidney disease: Secondary | ICD-10-CM | POA: Diagnosis not present

## 2018-02-28 DIAGNOSIS — R531 Weakness: Secondary | ICD-10-CM | POA: Diagnosis not present

## 2018-02-28 DIAGNOSIS — R251 Tremor, unspecified: Secondary | ICD-10-CM | POA: Diagnosis not present

## 2018-02-28 DIAGNOSIS — R682 Dry mouth, unspecified: Secondary | ICD-10-CM | POA: Diagnosis not present

## 2018-02-28 DIAGNOSIS — R600 Localized edema: Secondary | ICD-10-CM | POA: Diagnosis not present

## 2018-02-28 DIAGNOSIS — G4733 Obstructive sleep apnea (adult) (pediatric): Secondary | ICD-10-CM | POA: Diagnosis not present

## 2018-02-28 DIAGNOSIS — K219 Gastro-esophageal reflux disease without esophagitis: Secondary | ICD-10-CM | POA: Diagnosis not present

## 2018-02-28 DIAGNOSIS — F5101 Primary insomnia: Secondary | ICD-10-CM | POA: Diagnosis not present

## 2018-02-28 DIAGNOSIS — M79671 Pain in right foot: Secondary | ICD-10-CM | POA: Diagnosis not present

## 2018-02-28 DIAGNOSIS — D509 Iron deficiency anemia, unspecified: Secondary | ICD-10-CM | POA: Diagnosis not present

## 2018-02-28 DIAGNOSIS — I10 Essential (primary) hypertension: Secondary | ICD-10-CM | POA: Diagnosis not present

## 2018-02-28 DIAGNOSIS — J309 Allergic rhinitis, unspecified: Secondary | ICD-10-CM | POA: Diagnosis not present

## 2018-03-03 DIAGNOSIS — R600 Localized edema: Secondary | ICD-10-CM | POA: Diagnosis not present

## 2018-03-03 DIAGNOSIS — F330 Major depressive disorder, recurrent, mild: Secondary | ICD-10-CM | POA: Diagnosis not present

## 2018-03-03 DIAGNOSIS — G4733 Obstructive sleep apnea (adult) (pediatric): Secondary | ICD-10-CM | POA: Diagnosis not present

## 2018-03-03 DIAGNOSIS — I10 Essential (primary) hypertension: Secondary | ICD-10-CM | POA: Diagnosis not present

## 2018-03-03 DIAGNOSIS — E782 Mixed hyperlipidemia: Secondary | ICD-10-CM | POA: Diagnosis not present

## 2018-03-03 DIAGNOSIS — N183 Chronic kidney disease, stage 3 (moderate): Secondary | ICD-10-CM | POA: Diagnosis not present

## 2018-03-03 DIAGNOSIS — J309 Allergic rhinitis, unspecified: Secondary | ICD-10-CM | POA: Diagnosis not present

## 2018-03-03 DIAGNOSIS — E1122 Type 2 diabetes mellitus with diabetic chronic kidney disease: Secondary | ICD-10-CM | POA: Diagnosis not present

## 2018-03-03 DIAGNOSIS — B0222 Postherpetic trigeminal neuralgia: Secondary | ICD-10-CM | POA: Diagnosis not present

## 2018-03-03 DIAGNOSIS — R682 Dry mouth, unspecified: Secondary | ICD-10-CM | POA: Diagnosis not present

## 2018-03-03 DIAGNOSIS — R531 Weakness: Secondary | ICD-10-CM | POA: Diagnosis not present

## 2018-03-26 DIAGNOSIS — D509 Iron deficiency anemia, unspecified: Secondary | ICD-10-CM | POA: Diagnosis not present

## 2018-03-26 DIAGNOSIS — F5101 Primary insomnia: Secondary | ICD-10-CM | POA: Diagnosis not present

## 2018-03-26 DIAGNOSIS — F330 Major depressive disorder, recurrent, mild: Secondary | ICD-10-CM | POA: Diagnosis not present

## 2018-03-26 DIAGNOSIS — I10 Essential (primary) hypertension: Secondary | ICD-10-CM | POA: Diagnosis not present

## 2018-03-26 DIAGNOSIS — N183 Chronic kidney disease, stage 3 (moderate): Secondary | ICD-10-CM | POA: Diagnosis not present

## 2018-03-26 DIAGNOSIS — E782 Mixed hyperlipidemia: Secondary | ICD-10-CM | POA: Diagnosis not present

## 2018-03-26 DIAGNOSIS — G4733 Obstructive sleep apnea (adult) (pediatric): Secondary | ICD-10-CM | POA: Diagnosis not present

## 2018-03-26 DIAGNOSIS — E1122 Type 2 diabetes mellitus with diabetic chronic kidney disease: Secondary | ICD-10-CM | POA: Diagnosis not present

## 2018-03-26 DIAGNOSIS — K219 Gastro-esophageal reflux disease without esophagitis: Secondary | ICD-10-CM | POA: Diagnosis not present

## 2018-04-21 DIAGNOSIS — R600 Localized edema: Secondary | ICD-10-CM | POA: Diagnosis not present

## 2018-04-21 DIAGNOSIS — K219 Gastro-esophageal reflux disease without esophagitis: Secondary | ICD-10-CM | POA: Diagnosis not present

## 2018-04-21 DIAGNOSIS — F5101 Primary insomnia: Secondary | ICD-10-CM | POA: Diagnosis not present

## 2018-04-21 DIAGNOSIS — N183 Chronic kidney disease, stage 3 (moderate): Secondary | ICD-10-CM | POA: Diagnosis not present

## 2018-04-21 DIAGNOSIS — R531 Weakness: Secondary | ICD-10-CM | POA: Diagnosis not present

## 2018-04-21 DIAGNOSIS — R2689 Other abnormalities of gait and mobility: Secondary | ICD-10-CM | POA: Diagnosis not present

## 2018-04-21 DIAGNOSIS — E782 Mixed hyperlipidemia: Secondary | ICD-10-CM | POA: Diagnosis not present

## 2018-04-21 DIAGNOSIS — G4733 Obstructive sleep apnea (adult) (pediatric): Secondary | ICD-10-CM | POA: Diagnosis not present

## 2018-04-21 DIAGNOSIS — D509 Iron deficiency anemia, unspecified: Secondary | ICD-10-CM | POA: Diagnosis not present

## 2018-04-21 DIAGNOSIS — I10 Essential (primary) hypertension: Secondary | ICD-10-CM | POA: Diagnosis not present

## 2018-04-21 DIAGNOSIS — J309 Allergic rhinitis, unspecified: Secondary | ICD-10-CM | POA: Diagnosis not present

## 2018-04-21 DIAGNOSIS — E1122 Type 2 diabetes mellitus with diabetic chronic kidney disease: Secondary | ICD-10-CM | POA: Diagnosis not present

## 2018-04-28 DIAGNOSIS — G4733 Obstructive sleep apnea (adult) (pediatric): Secondary | ICD-10-CM | POA: Diagnosis not present

## 2018-05-24 DIAGNOSIS — Z23 Encounter for immunization: Secondary | ICD-10-CM | POA: Diagnosis not present

## 2018-06-27 DIAGNOSIS — E782 Mixed hyperlipidemia: Secondary | ICD-10-CM | POA: Diagnosis not present

## 2018-06-27 DIAGNOSIS — G4733 Obstructive sleep apnea (adult) (pediatric): Secondary | ICD-10-CM | POA: Diagnosis not present

## 2018-06-27 DIAGNOSIS — N183 Chronic kidney disease, stage 3 (moderate): Secondary | ICD-10-CM | POA: Diagnosis not present

## 2018-06-27 DIAGNOSIS — E1122 Type 2 diabetes mellitus with diabetic chronic kidney disease: Secondary | ICD-10-CM | POA: Diagnosis not present

## 2018-06-27 DIAGNOSIS — I10 Essential (primary) hypertension: Secondary | ICD-10-CM | POA: Diagnosis not present

## 2018-06-27 DIAGNOSIS — D509 Iron deficiency anemia, unspecified: Secondary | ICD-10-CM | POA: Diagnosis not present

## 2018-06-30 DIAGNOSIS — R600 Localized edema: Secondary | ICD-10-CM | POA: Diagnosis not present

## 2018-06-30 DIAGNOSIS — I10 Essential (primary) hypertension: Secondary | ICD-10-CM | POA: Diagnosis not present

## 2018-06-30 DIAGNOSIS — E1122 Type 2 diabetes mellitus with diabetic chronic kidney disease: Secondary | ICD-10-CM | POA: Diagnosis not present

## 2018-06-30 DIAGNOSIS — R531 Weakness: Secondary | ICD-10-CM | POA: Diagnosis not present

## 2018-06-30 DIAGNOSIS — R682 Dry mouth, unspecified: Secondary | ICD-10-CM | POA: Diagnosis not present

## 2018-06-30 DIAGNOSIS — E782 Mixed hyperlipidemia: Secondary | ICD-10-CM | POA: Diagnosis not present

## 2018-06-30 DIAGNOSIS — R251 Tremor, unspecified: Secondary | ICD-10-CM | POA: Diagnosis not present

## 2018-06-30 DIAGNOSIS — N183 Chronic kidney disease, stage 3 (moderate): Secondary | ICD-10-CM | POA: Diagnosis not present

## 2018-06-30 DIAGNOSIS — G4733 Obstructive sleep apnea (adult) (pediatric): Secondary | ICD-10-CM | POA: Diagnosis not present

## 2018-06-30 DIAGNOSIS — Z0000 Encounter for general adult medical examination without abnormal findings: Secondary | ICD-10-CM | POA: Diagnosis not present

## 2018-06-30 DIAGNOSIS — M79671 Pain in right foot: Secondary | ICD-10-CM | POA: Diagnosis not present

## 2018-07-03 DIAGNOSIS — M79671 Pain in right foot: Secondary | ICD-10-CM | POA: Diagnosis not present

## 2018-07-03 DIAGNOSIS — N183 Chronic kidney disease, stage 3 (moderate): Secondary | ICD-10-CM | POA: Diagnosis not present

## 2018-07-03 DIAGNOSIS — R531 Weakness: Secondary | ICD-10-CM | POA: Diagnosis not present

## 2018-07-03 DIAGNOSIS — R2689 Other abnormalities of gait and mobility: Secondary | ICD-10-CM | POA: Diagnosis not present

## 2018-08-12 DIAGNOSIS — G4733 Obstructive sleep apnea (adult) (pediatric): Secondary | ICD-10-CM | POA: Diagnosis not present

## 2018-09-12 DIAGNOSIS — G4733 Obstructive sleep apnea (adult) (pediatric): Secondary | ICD-10-CM | POA: Diagnosis not present

## 2018-10-13 DIAGNOSIS — E782 Mixed hyperlipidemia: Secondary | ICD-10-CM | POA: Diagnosis not present

## 2018-10-13 DIAGNOSIS — E1122 Type 2 diabetes mellitus with diabetic chronic kidney disease: Secondary | ICD-10-CM | POA: Diagnosis not present

## 2018-10-13 DIAGNOSIS — I10 Essential (primary) hypertension: Secondary | ICD-10-CM | POA: Diagnosis not present

## 2018-10-13 DIAGNOSIS — K219 Gastro-esophageal reflux disease without esophagitis: Secondary | ICD-10-CM | POA: Diagnosis not present

## 2018-10-13 DIAGNOSIS — N183 Chronic kidney disease, stage 3 (moderate): Secondary | ICD-10-CM | POA: Diagnosis not present

## 2018-11-03 DIAGNOSIS — G4733 Obstructive sleep apnea (adult) (pediatric): Secondary | ICD-10-CM | POA: Diagnosis not present

## 2018-11-08 DIAGNOSIS — Z0000 Encounter for general adult medical examination without abnormal findings: Secondary | ICD-10-CM | POA: Diagnosis not present

## 2018-12-02 DIAGNOSIS — G4733 Obstructive sleep apnea (adult) (pediatric): Secondary | ICD-10-CM | POA: Diagnosis not present

## 2018-12-15 DIAGNOSIS — F5101 Primary insomnia: Secondary | ICD-10-CM | POA: Diagnosis not present

## 2018-12-15 DIAGNOSIS — R251 Tremor, unspecified: Secondary | ICD-10-CM | POA: Diagnosis not present

## 2018-12-15 DIAGNOSIS — D509 Iron deficiency anemia, unspecified: Secondary | ICD-10-CM | POA: Diagnosis not present

## 2018-12-15 DIAGNOSIS — R531 Weakness: Secondary | ICD-10-CM | POA: Diagnosis not present

## 2018-12-15 DIAGNOSIS — R682 Dry mouth, unspecified: Secondary | ICD-10-CM | POA: Diagnosis not present

## 2018-12-15 DIAGNOSIS — K219 Gastro-esophageal reflux disease without esophagitis: Secondary | ICD-10-CM | POA: Diagnosis not present

## 2018-12-15 DIAGNOSIS — I10 Essential (primary) hypertension: Secondary | ICD-10-CM | POA: Diagnosis not present

## 2018-12-15 DIAGNOSIS — R600 Localized edema: Secondary | ICD-10-CM | POA: Diagnosis not present

## 2018-12-15 DIAGNOSIS — J309 Allergic rhinitis, unspecified: Secondary | ICD-10-CM | POA: Diagnosis not present

## 2018-12-15 DIAGNOSIS — G4733 Obstructive sleep apnea (adult) (pediatric): Secondary | ICD-10-CM | POA: Diagnosis not present

## 2018-12-15 DIAGNOSIS — M79671 Pain in right foot: Secondary | ICD-10-CM | POA: Diagnosis not present

## 2019-01-15 DIAGNOSIS — E782 Mixed hyperlipidemia: Secondary | ICD-10-CM | POA: Diagnosis not present

## 2019-01-15 DIAGNOSIS — I10 Essential (primary) hypertension: Secondary | ICD-10-CM | POA: Diagnosis not present

## 2019-01-15 DIAGNOSIS — N183 Chronic kidney disease, stage 3 (moderate): Secondary | ICD-10-CM | POA: Diagnosis not present

## 2019-01-15 DIAGNOSIS — R251 Tremor, unspecified: Secondary | ICD-10-CM | POA: Diagnosis not present

## 2019-01-15 DIAGNOSIS — E1122 Type 2 diabetes mellitus with diabetic chronic kidney disease: Secondary | ICD-10-CM | POA: Diagnosis not present

## 2019-01-15 DIAGNOSIS — D509 Iron deficiency anemia, unspecified: Secondary | ICD-10-CM | POA: Diagnosis not present

## 2019-01-15 DIAGNOSIS — F330 Major depressive disorder, recurrent, mild: Secondary | ICD-10-CM | POA: Diagnosis not present

## 2019-01-15 DIAGNOSIS — F5101 Primary insomnia: Secondary | ICD-10-CM | POA: Diagnosis not present

## 2019-01-15 DIAGNOSIS — K219 Gastro-esophageal reflux disease without esophagitis: Secondary | ICD-10-CM | POA: Diagnosis not present

## 2019-01-15 DIAGNOSIS — R600 Localized edema: Secondary | ICD-10-CM | POA: Diagnosis not present

## 2019-01-19 DIAGNOSIS — H0102A Squamous blepharitis right eye, upper and lower eyelids: Secondary | ICD-10-CM | POA: Diagnosis not present

## 2019-01-19 DIAGNOSIS — H04123 Dry eye syndrome of bilateral lacrimal glands: Secondary | ICD-10-CM | POA: Diagnosis not present

## 2019-01-19 DIAGNOSIS — H0102B Squamous blepharitis left eye, upper and lower eyelids: Secondary | ICD-10-CM | POA: Diagnosis not present

## 2019-01-19 DIAGNOSIS — H02834 Dermatochalasis of left upper eyelid: Secondary | ICD-10-CM | POA: Diagnosis not present

## 2019-01-19 DIAGNOSIS — Z961 Presence of intraocular lens: Secondary | ICD-10-CM | POA: Diagnosis not present

## 2019-01-19 DIAGNOSIS — H02831 Dermatochalasis of right upper eyelid: Secondary | ICD-10-CM | POA: Diagnosis not present

## 2019-01-19 DIAGNOSIS — H43812 Vitreous degeneration, left eye: Secondary | ICD-10-CM | POA: Diagnosis not present

## 2019-01-19 DIAGNOSIS — H40013 Open angle with borderline findings, low risk, bilateral: Secondary | ICD-10-CM | POA: Diagnosis not present

## 2019-01-26 DIAGNOSIS — G4733 Obstructive sleep apnea (adult) (pediatric): Secondary | ICD-10-CM | POA: Diagnosis not present

## 2019-02-16 ENCOUNTER — Other Ambulatory Visit: Payer: Self-pay

## 2019-02-17 DIAGNOSIS — E782 Mixed hyperlipidemia: Secondary | ICD-10-CM | POA: Diagnosis not present

## 2019-02-17 DIAGNOSIS — I10 Essential (primary) hypertension: Secondary | ICD-10-CM | POA: Diagnosis not present

## 2019-02-17 DIAGNOSIS — E1122 Type 2 diabetes mellitus with diabetic chronic kidney disease: Secondary | ICD-10-CM | POA: Diagnosis not present

## 2019-02-17 DIAGNOSIS — D509 Iron deficiency anemia, unspecified: Secondary | ICD-10-CM | POA: Diagnosis not present

## 2019-03-03 DIAGNOSIS — E782 Mixed hyperlipidemia: Secondary | ICD-10-CM | POA: Diagnosis not present

## 2019-03-03 DIAGNOSIS — R600 Localized edema: Secondary | ICD-10-CM | POA: Diagnosis not present

## 2019-03-03 DIAGNOSIS — E875 Hyperkalemia: Secondary | ICD-10-CM | POA: Diagnosis not present

## 2019-03-03 DIAGNOSIS — M79671 Pain in right foot: Secondary | ICD-10-CM | POA: Diagnosis not present

## 2019-03-03 DIAGNOSIS — I10 Essential (primary) hypertension: Secondary | ICD-10-CM | POA: Diagnosis not present

## 2019-03-03 DIAGNOSIS — F330 Major depressive disorder, recurrent, mild: Secondary | ICD-10-CM | POA: Diagnosis not present

## 2019-03-03 DIAGNOSIS — B0222 Postherpetic trigeminal neuralgia: Secondary | ICD-10-CM | POA: Diagnosis not present

## 2019-03-03 DIAGNOSIS — E1122 Type 2 diabetes mellitus with diabetic chronic kidney disease: Secondary | ICD-10-CM | POA: Diagnosis not present

## 2019-03-03 DIAGNOSIS — N3941 Urge incontinence: Secondary | ICD-10-CM | POA: Diagnosis not present

## 2019-03-03 DIAGNOSIS — N183 Chronic kidney disease, stage 3 (moderate): Secondary | ICD-10-CM | POA: Diagnosis not present

## 2019-03-03 DIAGNOSIS — R682 Dry mouth, unspecified: Secondary | ICD-10-CM | POA: Diagnosis not present

## 2019-03-04 ENCOUNTER — Inpatient Hospital Stay (HOSPITAL_COMMUNITY)
Admission: EM | Admit: 2019-03-04 | Discharge: 2019-03-10 | DRG: 061 | Disposition: A | Discharge status: Skilled Nursing Facility | Payer: PPO | Attending: Neurology | Admitting: Neurology

## 2019-03-04 ENCOUNTER — Emergency Department (HOSPITAL_COMMUNITY): Payer: PPO

## 2019-03-04 ENCOUNTER — Encounter (HOSPITAL_COMMUNITY): Payer: Self-pay

## 2019-03-04 ENCOUNTER — Other Ambulatory Visit: Payer: Self-pay

## 2019-03-04 DIAGNOSIS — G4733 Obstructive sleep apnea (adult) (pediatric): Secondary | ICD-10-CM | POA: Diagnosis not present

## 2019-03-04 DIAGNOSIS — R55 Syncope and collapse: Secondary | ICD-10-CM | POA: Diagnosis not present

## 2019-03-04 DIAGNOSIS — Z973 Presence of spectacles and contact lenses: Secondary | ICD-10-CM | POA: Diagnosis not present

## 2019-03-04 DIAGNOSIS — K219 Gastro-esophageal reflux disease without esophagitis: Secondary | ICD-10-CM | POA: Diagnosis not present

## 2019-03-04 DIAGNOSIS — E860 Dehydration: Secondary | ICD-10-CM | POA: Diagnosis present

## 2019-03-04 DIAGNOSIS — R0602 Shortness of breath: Secondary | ICD-10-CM | POA: Diagnosis not present

## 2019-03-04 DIAGNOSIS — Z833 Family history of diabetes mellitus: Secondary | ICD-10-CM

## 2019-03-04 DIAGNOSIS — E871 Hypo-osmolality and hyponatremia: Secondary | ICD-10-CM | POA: Diagnosis not present

## 2019-03-04 DIAGNOSIS — Z800 Family history of malignant neoplasm of digestive organs: Secondary | ICD-10-CM | POA: Diagnosis not present

## 2019-03-04 DIAGNOSIS — I517 Cardiomegaly: Secondary | ICD-10-CM | POA: Diagnosis not present

## 2019-03-04 DIAGNOSIS — M2550 Pain in unspecified joint: Secondary | ICD-10-CM | POA: Diagnosis not present

## 2019-03-04 DIAGNOSIS — I6389 Other cerebral infarction: Secondary | ICD-10-CM | POA: Diagnosis not present

## 2019-03-04 DIAGNOSIS — I509 Heart failure, unspecified: Secondary | ICD-10-CM | POA: Diagnosis not present

## 2019-03-04 DIAGNOSIS — N183 Chronic kidney disease, stage 3 (moderate): Secondary | ICD-10-CM | POA: Diagnosis not present

## 2019-03-04 DIAGNOSIS — E785 Hyperlipidemia, unspecified: Secondary | ICD-10-CM | POA: Diagnosis not present

## 2019-03-04 DIAGNOSIS — I5023 Acute on chronic systolic (congestive) heart failure: Secondary | ICD-10-CM | POA: Diagnosis present

## 2019-03-04 DIAGNOSIS — K449 Diaphragmatic hernia without obstruction or gangrene: Secondary | ICD-10-CM | POA: Diagnosis not present

## 2019-03-04 DIAGNOSIS — M810 Age-related osteoporosis without current pathological fracture: Secondary | ICD-10-CM | POA: Diagnosis not present

## 2019-03-04 DIAGNOSIS — I63411 Cerebral infarction due to embolism of right middle cerebral artery: Secondary | ICD-10-CM | POA: Diagnosis not present

## 2019-03-04 DIAGNOSIS — R1312 Dysphagia, oropharyngeal phase: Secondary | ICD-10-CM | POA: Diagnosis not present

## 2019-03-04 DIAGNOSIS — I639 Cerebral infarction, unspecified: Secondary | ICD-10-CM

## 2019-03-04 DIAGNOSIS — J45909 Unspecified asthma, uncomplicated: Secondary | ICD-10-CM | POA: Diagnosis not present

## 2019-03-04 DIAGNOSIS — Z888 Allergy status to other drugs, medicaments and biological substances status: Secondary | ICD-10-CM

## 2019-03-04 DIAGNOSIS — I5032 Chronic diastolic (congestive) heart failure: Secondary | ICD-10-CM | POA: Diagnosis not present

## 2019-03-04 DIAGNOSIS — Z6841 Body Mass Index (BMI) 40.0 and over, adult: Secondary | ICD-10-CM | POA: Diagnosis not present

## 2019-03-04 DIAGNOSIS — R29810 Facial weakness: Secondary | ICD-10-CM | POA: Diagnosis not present

## 2019-03-04 DIAGNOSIS — R0989 Other specified symptoms and signs involving the circulatory and respiratory systems: Secondary | ICD-10-CM | POA: Diagnosis not present

## 2019-03-04 DIAGNOSIS — Z20828 Contact with and (suspected) exposure to other viral communicable diseases: Secondary | ICD-10-CM | POA: Diagnosis not present

## 2019-03-04 DIAGNOSIS — I1310 Hypertensive heart and chronic kidney disease without heart failure, with stage 1 through stage 4 chronic kidney disease, or unspecified chronic kidney disease: Secondary | ICD-10-CM | POA: Diagnosis not present

## 2019-03-04 DIAGNOSIS — R404 Transient alteration of awareness: Secondary | ICD-10-CM | POA: Diagnosis not present

## 2019-03-04 DIAGNOSIS — Z885 Allergy status to narcotic agent status: Secondary | ICD-10-CM

## 2019-03-04 DIAGNOSIS — G8929 Other chronic pain: Secondary | ICD-10-CM | POA: Diagnosis not present

## 2019-03-04 DIAGNOSIS — I480 Paroxysmal atrial fibrillation: Secondary | ICD-10-CM | POA: Diagnosis not present

## 2019-03-04 DIAGNOSIS — Z9981 Dependence on supplemental oxygen: Secondary | ICD-10-CM | POA: Diagnosis not present

## 2019-03-04 DIAGNOSIS — I10 Essential (primary) hypertension: Secondary | ICD-10-CM | POA: Diagnosis not present

## 2019-03-04 DIAGNOSIS — E870 Hyperosmolality and hypernatremia: Secondary | ICD-10-CM | POA: Diagnosis present

## 2019-03-04 DIAGNOSIS — N289 Disorder of kidney and ureter, unspecified: Secondary | ICD-10-CM

## 2019-03-04 DIAGNOSIS — F419 Anxiety disorder, unspecified: Secondary | ICD-10-CM | POA: Diagnosis present

## 2019-03-04 DIAGNOSIS — I959 Hypotension, unspecified: Secondary | ICD-10-CM | POA: Diagnosis not present

## 2019-03-04 DIAGNOSIS — Z7982 Long term (current) use of aspirin: Secondary | ICD-10-CM

## 2019-03-04 DIAGNOSIS — M545 Low back pain: Secondary | ICD-10-CM | POA: Diagnosis present

## 2019-03-04 DIAGNOSIS — I69391 Dysphagia following cerebral infarction: Secondary | ICD-10-CM

## 2019-03-04 DIAGNOSIS — R471 Dysarthria and anarthria: Secondary | ICD-10-CM | POA: Diagnosis not present

## 2019-03-04 DIAGNOSIS — Z825 Family history of asthma and other chronic lower respiratory diseases: Secondary | ICD-10-CM | POA: Diagnosis not present

## 2019-03-04 DIAGNOSIS — N179 Acute kidney failure, unspecified: Secondary | ICD-10-CM | POA: Diagnosis not present

## 2019-03-04 DIAGNOSIS — D649 Anemia, unspecified: Secondary | ICD-10-CM | POA: Diagnosis present

## 2019-03-04 DIAGNOSIS — D631 Anemia in chronic kidney disease: Secondary | ICD-10-CM | POA: Diagnosis present

## 2019-03-04 DIAGNOSIS — Z7401 Bed confinement status: Secondary | ICD-10-CM | POA: Diagnosis not present

## 2019-03-04 DIAGNOSIS — M6281 Muscle weakness (generalized): Secondary | ICD-10-CM | POA: Diagnosis not present

## 2019-03-04 DIAGNOSIS — Z209 Contact with and (suspected) exposure to unspecified communicable disease: Secondary | ICD-10-CM | POA: Diagnosis not present

## 2019-03-04 DIAGNOSIS — Z741 Need for assistance with personal care: Secondary | ICD-10-CM | POA: Diagnosis not present

## 2019-03-04 DIAGNOSIS — Z978 Presence of other specified devices: Secondary | ICD-10-CM | POA: Diagnosis not present

## 2019-03-04 DIAGNOSIS — R29703 NIHSS score 3: Secondary | ICD-10-CM | POA: Diagnosis present

## 2019-03-04 DIAGNOSIS — R4701 Aphasia: Secondary | ICD-10-CM | POA: Diagnosis present

## 2019-03-04 DIAGNOSIS — R479 Unspecified speech disturbances: Secondary | ICD-10-CM | POA: Diagnosis present

## 2019-03-04 DIAGNOSIS — I6300 Cerebral infarction due to thrombosis of unspecified precerebral artery: Secondary | ICD-10-CM | POA: Diagnosis not present

## 2019-03-04 DIAGNOSIS — Z79899 Other long term (current) drug therapy: Secondary | ICD-10-CM

## 2019-03-04 DIAGNOSIS — M25559 Pain in unspecified hip: Secondary | ICD-10-CM | POA: Diagnosis present

## 2019-03-04 DIAGNOSIS — R000 Tachycardia, unspecified: Secondary | ICD-10-CM | POA: Diagnosis not present

## 2019-03-04 DIAGNOSIS — Z8249 Family history of ischemic heart disease and other diseases of the circulatory system: Secondary | ICD-10-CM | POA: Diagnosis not present

## 2019-03-04 DIAGNOSIS — R279 Unspecified lack of coordination: Secondary | ICD-10-CM | POA: Diagnosis not present

## 2019-03-04 DIAGNOSIS — R51 Headache: Secondary | ICD-10-CM | POA: Diagnosis not present

## 2019-03-04 DIAGNOSIS — E875 Hyperkalemia: Secondary | ICD-10-CM | POA: Diagnosis present

## 2019-03-04 DIAGNOSIS — R52 Pain, unspecified: Secondary | ICD-10-CM | POA: Diagnosis not present

## 2019-03-04 DIAGNOSIS — I130 Hypertensive heart and chronic kidney disease with heart failure and stage 1 through stage 4 chronic kidney disease, or unspecified chronic kidney disease: Secondary | ICD-10-CM | POA: Diagnosis present

## 2019-03-04 LAB — DIFFERENTIAL
Abs Immature Granulocytes: 0.03 10*3/uL (ref 0.00–0.07)
Basophils Absolute: 0.1 10*3/uL (ref 0.0–0.1)
Basophils Relative: 1 %
Eosinophils Absolute: 0.2 10*3/uL (ref 0.0–0.5)
Eosinophils Relative: 3 %
Immature Granulocytes: 1 %
Lymphocytes Relative: 26 %
Lymphs Abs: 1.7 10*3/uL (ref 0.7–4.0)
Monocytes Absolute: 0.6 10*3/uL (ref 0.1–1.0)
Monocytes Relative: 9 %
Neutro Abs: 3.8 10*3/uL (ref 1.7–7.7)
Neutrophils Relative %: 60 %

## 2019-03-04 LAB — PROTIME-INR
INR: 1 (ref 0.8–1.2)
Prothrombin Time: 12.9 seconds (ref 11.4–15.2)

## 2019-03-04 LAB — COMPREHENSIVE METABOLIC PANEL
ALT: 28 U/L (ref 0–44)
AST: 29 U/L (ref 15–41)
Albumin: 4 g/dL (ref 3.5–5.0)
Alkaline Phosphatase: 30 U/L — ABNORMAL LOW (ref 38–126)
Anion gap: 9 (ref 5–15)
BUN: 97 mg/dL — ABNORMAL HIGH (ref 8–23)
CO2: 21 mmol/L — ABNORMAL LOW (ref 22–32)
Calcium: 9.4 mg/dL (ref 8.9–10.3)
Chloride: 103 mmol/L (ref 98–111)
Creatinine, Ser: 1.52 mg/dL — ABNORMAL HIGH (ref 0.44–1.00)
GFR calc Af Amer: 36 mL/min — ABNORMAL LOW (ref >60)
GFR calc non Af Amer: 31 mL/min — ABNORMAL LOW (ref >60)
Glucose, Bld: 106 mg/dL — ABNORMAL HIGH (ref 70–99)
Potassium: 5.2 mmol/L — ABNORMAL HIGH (ref 3.5–5.1)
Sodium: 133 mmol/L — ABNORMAL LOW (ref 135–145)
Total Bilirubin: 0.8 mg/dL (ref 0.3–1.2)
Total Protein: 6.9 g/dL (ref 6.5–8.1)

## 2019-03-04 LAB — CBC
HCT: 37.1 % (ref 36.0–46.0)
Hemoglobin: 11.6 g/dL — ABNORMAL LOW (ref 12.0–15.0)
MCH: 29.1 pg (ref 26.0–34.0)
MCHC: 31.3 g/dL (ref 30.0–36.0)
MCV: 93 fL (ref 80.0–100.0)
Platelets: 222 10*3/uL (ref 150–400)
RBC: 3.99 MIL/uL (ref 3.87–5.11)
RDW: 15.5 % (ref 11.5–15.5)
WBC: 6.3 10*3/uL (ref 4.0–10.5)
nRBC: 0 % (ref 0.0–0.2)

## 2019-03-04 LAB — POCT I-STAT, CHEM 8
BUN: 96 mg/dL — ABNORMAL HIGH (ref 8–23)
Calcium, Ion: 1.3 mmol/L (ref 1.15–1.40)
Chloride: 107 mmol/L (ref 98–111)
Creatinine, Ser: 1.6 mg/dL — ABNORMAL HIGH (ref 0.44–1.00)
Glucose, Bld: 96 mg/dL (ref 70–99)
HCT: 39 % (ref 36.0–46.0)
Hemoglobin: 13.3 g/dL (ref 12.0–15.0)
Potassium: 5.2 mmol/L — ABNORMAL HIGH (ref 3.5–5.1)
Sodium: 136 mmol/L (ref 135–145)
TCO2: 21 mmol/L — ABNORMAL LOW (ref 22–32)

## 2019-03-04 LAB — APTT: aPTT: 38 seconds — ABNORMAL HIGH (ref 24–36)

## 2019-03-04 LAB — GLUCOSE, CAPILLARY: Glucose-Capillary: 103 mg/dL — ABNORMAL HIGH (ref 70–99)

## 2019-03-04 LAB — SARS CORONAVIRUS 2 BY RT PCR (HOSPITAL ORDER, PERFORMED IN ~~LOC~~ HOSPITAL LAB): SARS Coronavirus 2: NEGATIVE

## 2019-03-04 LAB — ETHANOL: Alcohol, Ethyl (B): <10 mg/dL (ref <10)

## 2019-03-04 MED ORDER — SODIUM CHLORIDE 0.9 % IV SOLN
50.0000 mL/h | INTRAVENOUS | Status: DC
  Administered 2019-03-04: 50 mL/h via INTRAVENOUS

## 2019-03-04 MED ORDER — IOHEXOL 350 MG/ML SOLN
100.0000 mL | Freq: Once | INTRAVENOUS | Status: AC | PRN
  Administered 2019-03-04: 60 mL via INTRAVENOUS

## 2019-03-04 MED ORDER — ACETAMINOPHEN 160 MG/5ML PO SOLN
650.0000 mg | ORAL | Status: DC | PRN
  Administered 2019-03-05: 650 mg
  Filled 2019-03-04: qty 20.3

## 2019-03-04 MED ORDER — STROKE: EARLY STAGES OF RECOVERY BOOK
Freq: Once | Status: AC
  Administered 2019-03-04: 23:00:00
  Filled 2019-03-04: qty 1

## 2019-03-04 MED ORDER — CHLORHEXIDINE GLUCONATE CLOTH 2 % EX PADS
6.0000 | MEDICATED_PAD | Freq: Every day | CUTANEOUS | Status: DC
  Administered 2019-03-05: 6 via TOPICAL

## 2019-03-04 MED ORDER — ALTEPLASE 100 MG IV SOLR
INTRAVENOUS | Status: AC
  Filled 2019-03-04: qty 100

## 2019-03-04 MED ORDER — ACETAMINOPHEN 325 MG PO TABS
650.0000 mg | ORAL_TABLET | ORAL | Status: DC | PRN
  Administered 2019-03-05 – 2019-03-10 (×15): 650 mg via ORAL
  Filled 2019-03-04 (×15): qty 2

## 2019-03-04 MED ORDER — NICARDIPINE HCL IN NACL 20-0.86 MG/200ML-% IV SOLN
0.0000 mg/h | INTRAVENOUS | Status: DC | PRN
  Filled 2019-03-04: qty 200

## 2019-03-04 MED ORDER — LABETALOL HCL 5 MG/ML IV SOLN
10.0000 mg | Freq: Once | INTRAVENOUS | Status: AC | PRN
  Administered 2019-03-07: 10 mg via INTRAVENOUS
  Filled 2019-03-04: qty 4

## 2019-03-04 MED ORDER — PANTOPRAZOLE SODIUM 40 MG IV SOLR
40.0000 mg | Freq: Every day | INTRAVENOUS | Status: DC
  Administered 2019-03-04 – 2019-03-06 (×3): 40 mg via INTRAVENOUS
  Filled 2019-03-04 (×3): qty 40

## 2019-03-04 MED ORDER — ALTEPLASE (STROKE) FULL DOSE INFUSION
90.0000 mg | Freq: Once | INTRAVENOUS | Status: AC
  Administered 2019-03-04: 90 mg via INTRAVENOUS

## 2019-03-04 MED ORDER — ACETAMINOPHEN 650 MG RE SUPP: 650.0000 mg | RECTAL | Status: DC | PRN

## 2019-03-04 MED ORDER — SODIUM CHLORIDE 0.9 % IV SOLN
50.0000 mL | Freq: Once | INTRAVENOUS | Status: AC
  Administered 2019-03-04: 50 mL via INTRAVENOUS

## 2019-03-04 NOTE — Progress Notes (Signed)
CODE STROKE DOCUMENTATION CALL TIME = 1916 [place] 1917 [place] TO [place] 1922 EXAM COMPLETED IN Epic 1923] [place] CALLED [phone]

## 2019-03-04 NOTE — ED Triage Notes (Signed)
Pt brought to ED via [facility] for headache and difficulty speaking started at 1830.  Pt family was with her at the time. LKW 1830

## 2019-03-04 NOTE — ED Provider Notes (Signed)
Emergency Department Provider Note   I have reviewed the triage vital signs and the nursing notes.   HISTORY  Chief Complaint Code Stroke   HPI LAFAWN [name] is a 85 y.o. female with PMH listed below presents to the ED via EMS for evaluation of acute onset headache with difficulty speaking.  Last seen normal at 1830.  Patient is unable to provide significant history given her speech difficulty.  Level 5 caveat applies due to speech difficulty.  EMS report baseline right leg weakness from prior event.   Past Medical History:  Diagnosis Date  . Asthma   . Breast mass    Left  . Chronic kidney disease    Borderline with serum creatinine at the upper limit of normal in 10/2011  . Chronic low back pain   . Diverticular disease    acute diverticulitis in 02/2010  . Dyspnea on exertion 2010  . Gastroesophageal reflux disease    hiatal hernia  . Hyperlipidemia    Lipid profile 10/2011:240, 132, 52, 162  . Hypertension    Lab  10/2011: Normal CMet except GFR of 50, normal microalbumin, normal CBC, uric acid of 7.9, A1c of 6.1, normal urinalysis, low vitamin D; negative stress nuclear study in 2008  . Obesity   . Osteoporosis   . Paroxysmal atrial fibrillation (HCC)   . Sleep apnea 2010   Severe  . Syncope 06/2011  . Urinary incontinence    Interstitial cystitis    Patient Active Problem List   Diagnosis Date Noted  . 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 Sleep apnea   . Diverticular disease   . Paroxysmal atrial fibrillation (HCC)   . Hypertension   . Hyperlipidemia   . Syncope 06/29/2011  . Morbid obesity ([place]) 06/29/2011    Past Surgical History:  Procedure Laterality Date  . CATARACT EXTRACTION    . CHOLECYSTECTOMY  1993  . CHOLECYSTECTOMY  1993  . COLONOSCOPY  2008   Negative screening study  . DILATION AND CURETTAGE OF UTERUS  1986  . LUMBAR LAMINECTOMY  1992  . ORTHOPEDIC SURGERY    . TUBAL LIGATION      Allergies Ace inhibitors, Ciprofloxacin,  Hydrocodone-acetaminophen, Lipitor [atorvastatin], Monopril [fosinopril sodium], Morphine and related, Nitrofurantoin monohyd macro, Percocet [oxycodone-acetaminophen], Statins, Flagyl [metronidazole], and Phenazopyridine  Family History  Problem Relation Age of Onset  . Heart attack Mother   . Asthma Father   . Diabetes Father   . Heart failure Father   . Colitis Sister   . Heart failure Brother   . Pancreatic cancer Brother   . Diabetes Sister   . Transient ischemic attack Sister     Social History Social History   Tobacco Use  . Smoking status: Never Smoker  . Smokeless tobacco: Never Used  Substance Use Topics  . Alcohol use: No    Alcohol/week: 0.0 standard drinks  . Drug use: No    Review of Systems  Level 5 caveat: Speech disturbance.   ____________________________________________   PHYSICAL EXAM:  VITAL SIGNS: Vitals:   03/05/19 1100 03/05/19 1200  BP: 124/85 (!) 119/48  Pulse: 90 74  Resp: 17 12  Temp:    SpO2: 99% 95%    Constitutional: Alert and oriented. Difficulty with speaking.  Eyes: Conjunctivae are normal. PERRL.  Head: Atraumatic. Nose: No congestion/rhinnorhea. Mouth/Throat: Mucous membranes are moist.   Neck: No stridor.   Cardiovascular: Normal rate, regular rhythm. Good peripheral circulation. Grossly normal heart sounds.   Respiratory: Slight increased  respiratory effort.  No retractions. Lungs CTAB. Gastrointestinal: Soft and nontender. No distention.  Musculoskeletal: No lower extremity tenderness nor edema. No gross deformities of extremities. Neurologic: Patient with difficulty getting out her words with some mild dysarthria when she does manage to speak. No facial droop. No numbness in the upper or lower extremities. Baseline weakness in the RLE noted.  Skin:  Skin is warm, dry and intact. No rash noted.  ____________________________________________   LABS (all labs ordered are listed, but only abnormal results are displayed)   Labs Reviewed  APTT - Abnormal; Notable for the following components:      Result Value   aPTT 38 (*)    All other components within normal limits  CBC - Abnormal; Notable for the following components:   Hemoglobin 11.6 (*)    All other components within normal limits  COMPREHENSIVE METABOLIC PANEL - Abnormal; Notable for the following components:   Sodium 133 (*)    Potassium 5.2 (*)    CO2 21 (*)    Glucose, Bld 106 (*)    BUN 97 (*)    Creatinine, Ser 1.52 (*)    Alkaline Phosphatase 30 (*)    GFR calc non Af Amer 31 (*)    GFR calc Af Amer 36 (*)    All other components within normal limits  GLUCOSE, CAPILLARY - Abnormal; Notable for the following components:   Glucose-Capillary 103 (*)    All other components within normal limits  CBC - Abnormal; Notable for the following components:   RBC 3.41 (*)    Hemoglobin 10.0 (*)    HCT 31.8 (*)    All other components within normal limits  HEMOGLOBIN A1C - Abnormal; Notable for the following components:   Hgb A1c MFr Bld 5.9 (*)    All other components within normal limits  URINALYSIS, ROUTINE W REFLEX MICROSCOPIC - Abnormal; Notable for the following components:   Color, Urine STRAW (*)    Leukocytes,Ua SMALL (*)    Bacteria, UA RARE (*)    All other components within normal limits  BASIC METABOLIC PANEL - Abnormal; Notable for the following components:   CO2 19 (*)    BUN 83 (*)    Creatinine, Ser 1.44 (*)    GFR calc non Af Amer 33 (*)    GFR calc Af Amer 39 (*)    All other components within normal limits  POCT I-STAT, CHEM 8 - Abnormal; Notable for the following components:   Potassium 5.2 (*)    BUN 96 (*)    Creatinine, Ser 1.60 (*)    TCO2 21 (*)    All other components within normal limits  SARS CORONAVIRUS 2 (HOSPITAL ORDER, PERFORMED IN [facility])  MRSA PCR SCREENING  ETHANOL  PROTIME-INR  DIFFERENTIAL  LIPID PANEL  RAPID URINE DRUG SCREEN, HOSP PERFORMED  URINALYSIS, ROUTINE W REFLEX  MICROSCOPIC  I-STAT CHEM 8, ED   ____________________________________________  EKG  EKG reviewed. No STEMI.  ____________________________________________  RADIOLOGY  CT imaging reads reviewed.  ____________________________________________   PROCEDURES  Procedure(s) performed:   Procedures  CRITICAL CARE Performed by: Maia PlanJoshua G Long Total critical care time: 35 minutes Critical care time was exclusive of separately billable procedures and treating other patients. Critical care was necessary to treat or prevent imminent or life-threatening deterioration. Critical care was time spent personally by me on the following activities: development of treatment plan with patient and/or surrogate as well as nursing, discussions with consultants, evaluation of  patient's response to treatment, examination of patient, obtaining history from patient or surrogate, ordering and performing treatments and interventions, ordering and review of laboratory studies, ordering and review of radiographic studies, pulse oximetry and re-evaluation of patient's condition.  Alona BeneJoshua Long, MD Emergency Medicine  ____________________________________________   INITIAL IMPRESSION / ASSESSMENT AND PLAN / ED COURSE  Pertinent labs & imaging results that were available during my care of the patient were reviewed by me and considered in my medical decision making (see chart for details).   Patient arrived to the emergency department by EMS with acute onset speech disturbance and headache.  I evaluated the patient briefly immediately upon arrival and EMS took the patient directly to CT as a code stroke.  Radiology called to tell me that the system is down and there may be a delay of approximately 15 minutes for the radiologist to read the study but they will push it through.   Radiology and tele-neurology have reviewed the CT images.  Nothing acute.  Tele-neurologist has offered the patient and daughter at bedside tPA  and that is being administered.  No blood pressure control at this time.  Patient has some associated dyspnea.  I am sending a COVID-19 swab but patient has not had symptoms of COVID. CXR pending. [facility]   08:15 PM  Discussed patient's case with Neurology, Dr. Amada JupiterKirkpatrick to request admission. Patient and family (if present) updated with plan.  I reviewed all nursing notes, vitals, pertinent old records, EKGs, labs, imaging (as available).  ____________________________________________  FINAL CLINICAL IMPRESSION(S) / ED DIAGNOSES  Final diagnoses:  Cerebral infarction, unspecified mechanism (HCC)     MEDICATIONS GIVEN DURING THIS VISIT:  Medications  Chlorhexidine Gluconate Cloth 2 % PADS 6 each (has no administration in time range)  acetaminophen (TYLENOL) tablet 650 mg (has no administration in time range)    Or  acetaminophen (TYLENOL) solution 650 mg (has no administration in time range)    Or  acetaminophen (TYLENOL) suppository 650 mg (has no administration in time range)  labetalol (NORMODYNE) injection 10 mg (has no administration in time range)    And  nicardipine (CARDENE) 20mg  in 0.86% saline 200ml IV infusion (0.1 mg/ml) (has no administration in time range)  pantoprazole (PROTONIX) injection 40 mg (40 mg Intravenous Given 03/04/19 2324)  furosemide (LASIX) injection 20 mg (0 mg Intravenous Hold 03/05/19 0536)  0.9 %  sodium chloride infusion ( Intravenous IV Pump Association 03/05/19 0954)  Resource ThickenUp Clear (has no administration in time range)  chlorhexidine (PERIDEX) 0.12 % solution 15 mL (has no administration in time range)  MEDLINE mouth rinse (has no administration in time range)  alteplase (ACTIVASE) 1 mg/mL infusion 90 mg (0 mg Intravenous Stopped 03/04/19 2107)    Followed by  0.9 %  sodium chloride infusion (50 mLs Intravenous New Bag/Given 03/04/19 2109)  iohexol (OMNIPAQUE) 350 MG/ML injection 100 mL (60  mLs Intravenous Contrast Given 03/04/19 2041)   stroke: mapping our early stages of recovery book ( Does not apply Given 03/04/19 2324)    Note:  This document was prepared using Dragon voice recognition software and may include unintentional dictation errors.  Alona BeneJoshua Long, MD Emergency Medicine    Long, Arlyss RepressJoshua G, MD 03/05/19 1310

## 2019-03-04 NOTE — ED Notes (Signed)
[facility] at bedside 

## 2019-03-04 NOTE — Consult Note (Signed)
[facility] TeleNeurology Consult Services   Date of Service:   03/04/2019 19:33:07  Impression:     .  stroke  Comments/Sign-Out: she would benefit from a general medical evaluation for causes of stroke. This would include a follow-up CAT scan 24 hours after TPA, telemetry, carotid ultrasound, neurology follow-up  Metrics: Last Known Well: 03/04/2019 18:30:00 [facility] Notification Time: 03/04/2019 19:33:07 Arrival Time: 03/04/2019 19:20:00 Stamp Time: 03/04/2019 19:33:07 Time First Login Attempt: 03/04/2019 19:38:23 Video Start Time: 03/04/2019 19:38:23  Symptoms: speech problems NIHSS Start Assessment Time: 03/04/2019 19:41:50 tPA Verbal Order Time: 03/04/2019 19:43:20 Patient is a candidate for tPA. tPA CPOE Order Time: 03/04/2019 20:07:58 Needle Time: 03/04/2019 20:09:35 Weight Noted by Staff: 222.5 lbs Video End Time: 03/04/2019 20:10:09 Reason for tPA Delay: Medical decision making  CT head showed no acute hemorrhage or acute core infarct.  Clinical Presentation is not Suggestive of Large Vessel Occlusive Disease  ED Physician notified of diagnostic impression and management plan on 03/04/2019 20:13:29  Verbal Consent to tPA: I have explained to the Patient and Family the nature of the patient's condition, reviewed the indications and contraindications to the use of tPA fibrinolytic agent, reviewed the indications and contraindications and the benefits to be reasonably expected compared with alternative approaches. I have discussed the likelihood of major risks or complications of this procedure including (if applicable) but not limited to loss of limb function, brain damage, paralysis, hemorrhage, infection, complications from transfusion of blood components, drug reactions, blood clots and loss of life. I have also indicated that with any procedure there is always the possibility of an unexpected complication. All questions were answered and Patient and Family  express understanding of the treatment plan and consent to the treatment.  Our recommendations are outlined below.  Recommendations: IV tPA recommended.  tPA bolus given Without Complication.   IV tPA Total Dose - 90.0 mg IV tPA Bolus Dose - 9.0 mg IV tPA Infusion Dose - 81.0 mg  Routine post tPA monitoring including neuro checks and blood pressure control during/after treatment Monitor blood pressure Check blood pressure and NIHSS every 15 min for 2 h, then every 30 min for 6 h, and finally every hour for 16 h.  Manage Blood Pressure per post tPA protocol.      .  Admission to ICU     .  CT brain 24 hours post tPA     .  NPO until swallowing screen performed and passed     .  No antiplatelet agents or anticoagulants (including heparin for DVT prophylaxis) in first 24 hours     .  No Foley catheter, nasogastric tube, arterial catheter or central venous catheter for 24 hr, unless absolutely necessary     .  Telemetry     .  Bedside swallow evaluation     .  HOB less than 30 degrees     .  Euglycemia     .  Avoid hyperthermia, PRN acetaminophen     .  DVT prophylaxis     .  Inpatient Neurology Consultation     .  Stroke evaluation as per inpatient neurology recommendations  Discussed with ED physician    ------------------------------------------------------------------------------  History of Present Illness: Patient is a 85 year old Female.  Patient was brought by EMS for symptoms of speech problems  this is an 85 year old female who was with her family and at [id number] tonight developed an abrupt change in her speech she developed dysarthria and difficulty getting words out. She  also has a sense of tingling in the right side of her body which is new. She is normally wheelchair bound and she is not sure that she has any new weakness.  Last seen normal was within 4.5 hours. There is no history of hemorrhagic complications or intracranial hemorrhage. There is no history of  Recent Anticoagulants. There is no history of recent major surgery. There is no history of recent stroke.  Past Medical History:  Anticoagulant use:  No  Antiplatelet use: asa 81 mg qd  Examination: BP(90/64), Blood Glucose(102) 1A: Level of Consciousness - Alert; keenly responsive + 0 1B: Ask Month and Age - Both Questions Right + 0 1C: Blink Eyes & Squeeze Hands - Performs Both Tasks + 0 2: Test Horizontal Extraocular Movements - Normal + 0 3: Test Visual Fields - No Visual Loss + 0 4: Test Facial Palsy (Use Grimace if Obtunded) - Minor paralysis (flat nasolabial fold, smile asymmetry) + 1 5A: Test Left Arm Motor Drift - No Drift for 10 Seconds + 0 5B: Test Right Arm Motor Drift - No Drift for 10 Seconds + 0 6A: Test Left Leg Motor Drift - No Drift for 5 Seconds + 0 6B: Test Right Leg Motor Drift - No Drift for 5 Seconds + 0 7: Test Limb Ataxia (FNF/Heel-Shin) - No Ataxia + 0 8: Test Sensation - Mild-Moderate Loss: Less Sharp/More Dull + 1 9: Test Language/Aphasia - Mild-Moderate Aphasia: Some Obvious Changes, Without Significant Limitation + 1 10: Test Dysarthria - Normal + 0 11: Test Extinction/Inattention - No abnormality + 0  NIHSS Score: 3  Patient/Family was informed the Neurology Consult would happen via TeleHealth consult by way of interactive audio and video telecommunications and consented to receiving care in this manner.   Due to the immediate potential for life-threatening deterioration due to underlying acute neurologic illness, I spent 35 minutes providing critical care. This time includes time for face to face visit via telemedicine, review of medical records, imaging studies and discussion of findings with providers, the patient and/or family.   Dr Janean Sark   [facility] [phone]   Case [id number]

## 2019-03-04 NOTE — ED Notes (Signed)
ED TO INPATIENT HANDOFF REPORT  ED Nurse Name and Phone #: [phone]  S Name/Age/Gender Stefanie [name] 85 y.o. female Room/Bed: APOTF/OTF  Code Status   Code Status: Prior  Home/SNF/Other Home Patient oriented to: self Is this baseline? Yes   Triage Complete: Triage complete  Chief Complaint Code Stroke  Triage Note Pt brought to ED via [facility] for headache and difficulty speaking started at 1830.  Pt family was with her at the time. LKW 1830   Allergies Allergies  Allergen Reactions  . Ace Inhibitors Cough    Occurred with Monopril  . Ciprofloxacin     Sick  . Hydrocodone-Acetaminophen Nausea Only  . Lipitor [Atorvastatin]     Tired, weak muscles  . Monopril [Fosinopril Sodium]     Cough   . Morphine And Related   . Nitrofurantoin Monohyd Macro Nausea Only    Also skin rash, edema   . Percocet [Oxycodone-Acetaminophen] Nausea Only  . Statins Other (See Comments)    Myalgias; prior exposure to pravastatin, atorvastatin, lovastatin, Lescol, simvastatin and ezetimibe  . Flagyl [Metronidazole] Nausea Only  . Phenazopyridine Rash    Level of Care/Admitting Diagnosis ED Disposition    ED Disposition Condition Comment   Admit  The patient appears reasonably stabilized for admission considering the current resources, flow, and capabilities available in the ED at this time, and I doubt any other [facility] requiring further screening and/or treatment in the ED prior to admission is  present.       B Medical/Surgery History Past Medical History:  Diagnosis Date  . Asthma   . Breast mass    Left  . Chronic kidney disease    Borderline with serum creatinine at the upper limit of normal in 10/2011  . Chronic low back pain   . Diverticular disease    acute diverticulitis in 02/2010  . Dyspnea on exertion 2010  . Gastroesophageal reflux disease    hiatal hernia  . Hyperlipidemia    Lipid profile 10/2011:240, 132, 52, 162  . Hypertension    Lab  10/2011:  Normal CMet except GFR of 50, normal microalbumin, normal CBC, uric acid of 7.9, A1c of 6.1, normal urinalysis, low vitamin D; negative stress nuclear study in 2008  . Obesity   . Osteoporosis   . Paroxysmal atrial fibrillation (HCC)   . Sleep apnea 2010   Severe  . Syncope 06/2011  . Urinary incontinence    Interstitial cystitis   Past Surgical History:  Procedure Laterality Date  . CATARACT EXTRACTION    . CHOLECYSTECTOMY  1993  . CHOLECYSTECTOMY  1993  . COLONOSCOPY  2008   Negative screening study  . DILATION AND CURETTAGE OF UTERUS  1986  . LUMBAR LAMINECTOMY  1992  . ORTHOPEDIC SURGERY    . TUBAL LIGATION       A IV Location/Drains/Wounds Patient Lines/Drains/Airways Status   Active Line/Drains/Airways    Name:   Placement date:   Placement time:   Site:   Days:   Peripheral IV 03/04/19 Right Wrist   03/04/19    2007    Wrist   less than 1   Peripheral IV 03/04/19 Left Antecubital   03/04/19    2028    Antecubital   less than 1   External Urinary Catheter   03/04/19    2019    -   less than 1          Intake/Output Last 24 hours  Intake/Output Summary (Last  24 hours) at 03/04/2019 2148 Last data filed at 03/04/2019 2107 Gross per 24 hour  Intake 90 ml  Output -  Net 90 ml    Labs/Imaging Results for orders placed or performed during the hospital encounter of 03/04/19 (from the past 48 hour(s))  Ethanol     Status: None   Collection Time: 03/04/19  7:34 PM  Result Value Ref Range   Alcohol, Ethyl (B) <10 <10 mg/dL    Comment: (NOTE) Lowest detectable limit for serum alcohol is 10 mg/dL. For medical purposes only. Performed at [facility], [address] [place], [place] [address]   Protime-INR     Status: None   Collection Time: 03/04/19  7:34 PM  Result Value Ref Range   Prothrombin Time 12.9 11.4 - 15.2 seconds   INR 1.0 0.8 - 1.2    Comment: (NOTE) INR goal varies based on device and disease states. Performed at [facility], [address]  [place], [place] [address]   APTT     Status: Abnormal   Collection Time: 03/04/19  7:34 PM  Result Value Ref Range   aPTT 38 (H) 24 - 36 seconds    Comment:        IF BASELINE aPTT IS ELEVATED, SUGGEST PATIENT RISK ASSESSMENT BE USED TO DETERMINE APPROPRIATE ANTICOAGULANT THERAPY. Performed at [facility], [address] [place], [place] [address]   CBC     Status: Abnormal   Collection Time: 03/04/19  7:34 PM  Result Value Ref Range   WBC 6.3 4.0 - 10.5 K/uL   RBC 3.99 3.87 - 5.11 MIL/uL   Hemoglobin 11.6 (L) 12.0 - 15.0 g/dL   HCT 21.337.1 08.636.0 - 57.846.0 %   MCV 93.0 80.0 - 100.0 fL   MCH 29.1 26.0 - 34.0 pg   MCHC 31.3 30.0 - 36.0 g/dL   RDW 46.915.5 62.911.5 - 52.815.5 %   Platelets 222 150 - 400 K/uL   nRBC 0.0 0.0 - 0.2 %    Comment: Performed at [facility], [address] [place], [place] [address]  Differential     Status: None   Collection Time: 03/04/19  7:34 PM  Result Value Ref Range   Neutrophils Relative % 60 %   Neutro Abs 3.8 1.7 - 7.7 K/uL   Lymphocytes Relative 26 %   Lymphs Abs 1.7 0.7 - 4.0 K/uL   Monocytes Relative 9 %   Monocytes Absolute 0.6 0.1 - 1.0 K/uL   Eosinophils Relative 3 %   Eosinophils Absolute 0.2 0.0 - 0.5 K/uL   Basophils Relative 1 %   Basophils Absolute 0.1 0.0 - 0.1 K/uL   Immature Granulocytes 1 %   Abs Immature Granulocytes 0.03 0.00 - 0.07 K/uL    Comment: Performed at [facility], [address] [place], [place] [address]  Comprehensive metabolic panel     Status: Abnormal   Collection Time: 03/04/19  7:34 PM  Result Value Ref Range   Sodium 133 (L) 135 - 145 mmol/L   Potassium 5.2 (H) 3.5 - 5.1 mmol/L   Chloride 103 98 - 111 mmol/L   CO2 21 (L) 22 - 32 mmol/L   Glucose, Bld 106 (H) 70 - 99 mg/dL   BUN 97 (H) 8 - 23 mg/dL   Creatinine, Ser 7.251.52 (H) 0.44 - 1.00 mg/dL   Calcium 9.4 8.9 - 36.610.3 mg/dL   Total Protein 6.9 6.5 - 8.1 g/dL   Albumin 4.0 3.5 - 5.0 g/dL   AST 29  15 - 41 U/L   ALT 28 0 - 44 U/L   Alkaline Phosphatase 30 (L) 38 -  126 U/L   Total Bilirubin 0.8 0.3 - 1.2 mg/dL   GFR calc non Af Amer 31 (L) >60 mL/min   GFR calc Af Amer 36 (L) >60 mL/min   Anion gap 9 5 - 15    Comment: Performed at [facility], [address] [place], [place] [address]  I-STAT, [place]     Status: Abnormal   Collection Time: 03/04/19  7:44 PM  Result Value Ref Range   Sodium 136 135 - 145 mmol/L   Potassium 5.2 (H) 3.5 - 5.1 mmol/L   Chloride 107 98 - 111 mmol/L   BUN 96 (H) 8 - 23 mg/dL   Creatinine, Ser 9.141.60 (H) 0.44 - 1.00 mg/dL   Glucose, Bld 96 70 - 99 mg/dL   Calcium, Ion 7.821.30 9.561.15 - 1.40 mmol/L   TCO2 21 (L) 22 - 32 mmol/L   Hemoglobin 13.3 12.0 - 15.0 g/dL   HCT 21.339.0 08.636.0 - 57.846.0 %  SARS Coronavirus 2 [facility](Hospital order, Performed in [facility] hospital lab) Nasopharyngeal Nasopharyngeal Swab     Status: None   Collection Time: 03/04/19  8:10 PM   Specimen: Nasopharyngeal Swab  Result Value Ref Range   SARS Coronavirus 2 NEGATIVE NEGATIVE    Comment: (NOTE) If result is NEGATIVE SARS-CoV-2 target nucleic acids are NOT DETECTED. The SARS-CoV-2 RNA is generally detectable in upper and lower  respiratory specimens during the acute phase of infection. The lowest  concentration of SARS-CoV-2 viral copies this assay can detect is 250  copies / mL. A negative result does not preclude SARS-CoV-2 infection  and should not be used as the sole basis for treatment or other  patient management decisions.  A negative result may occur with  improper specimen collection / handling, submission of specimen other  than nasopharyngeal swab, presence of viral mutation(s) within the  areas targeted by this assay, and inadequate number of viral copies  (<250 copies / mL). A negative result must be combined with clinical  observations, patient history, and epidemiological information. If result is POSITIVE SARS-CoV-2 target nucleic acids are DETECTED. The SARS-CoV-2 RNA is generally detectable in upper and lower  respiratory specimens  dur ing the acute phase of infection.  Positive  results are indicative of active infection with SARS-CoV-2.  Clinical  correlation with patient history and other diagnostic information is  necessary to determine patient infection status.  Positive results do  not rule out bacterial infection or co-infection with other viruses. If result is PRESUMPTIVE POSTIVE SARS-CoV-2 nucleic acids MAY BE PRESENT.   A presumptive positive result was obtained on the submitted specimen  and confirmed on repeat testing.  While 2019 novel coronavirus  (SARS-CoV-2) nucleic acids may be present in the submitted sample  additional confirmatory testing may be necessary for epidemiological  and / or clinical management purposes  to differentiate between  SARS-CoV-2 and other Sarbecovirus currently known to infect humans.  If clinically indicated additional testing with an alternate test  methodology [phone]([id number]) is advised. The SARS-CoV-2 RNA is generally  detectable in upper and lower respiratory sp ecimens during the acute  phase of infection. The expected result is Negative. Fact Sheet for Patients:  BoilerBrush.com.cyhttps://www.fda.gov/media/136312/download Fact Sheet for Healthcare Providers: https://pope.com/https://www.fda.gov/media/136313/download This test is not yet approved or cleared by the [place] FDA and has been authorized for detection and/or diagnosis of SARS-CoV-2 by FDA under  an Emergency Use Authorization (EUA).  This EUA will remain in effect (meaning this test can be used) for the duration of the COVID-19 declaration under Section 564(b)(1) of the Act, 21 U.S.C. section 360bbb-3(b)(1), unless the authorization is terminated or revoked sooner. Performed at [facility], [address] [place], [place] [address]    No results found.  Pending Labs Unresulted Labs (From admission, onward)    Start     Ordered   03/04/19 1923  Urine rapid drug screen (hosp performed)  ONCE - STAT,   STAT     03/04/19 1922    03/04/19 1923  Urinalysis, Routine w reflex microscopic  ONCE - STAT,   STAT     03/04/19 1922          Vitals/Pain Today's Vitals   03/04/19 2115 03/04/19 2120 03/04/19 2125 03/04/19 2130  BP: (!) 126/56 129/65 129/75 (!) 139/57  Pulse: 83 84 83 85  Resp: 18 16 17 16   Temp:      TempSrc:      SpO2: 100% 100% 96% 99%  Weight:      Height:      PainSc:        Isolation Precautions No active isolations  Medications Medications  alteplase (ACTIVASE) 1 mg/mL infusion 90 mg (0 mg Intravenous Stopped 03/04/19 2107)    Followed by  0.9 %  sodium chloride infusion (50 mLs Intravenous New Bag/Given 03/04/19 2109)  iohexol (OMNIPAQUE) 350 MG/ML injection 100 mL (60 mLs Intravenous Contrast Given 03/04/19 2041)    Mobility walks High fall risk   Focused Assessments    R Recommendations: See Admitting Provider Note  Report given to:   Additional Notes:

## 2019-03-05 ENCOUNTER — Inpatient Hospital Stay (HOSPITAL_COMMUNITY): Payer: PPO

## 2019-03-05 DIAGNOSIS — I639 Cerebral infarction, unspecified: Secondary | ICD-10-CM

## 2019-03-05 DIAGNOSIS — I6389 Other cerebral infarction: Secondary | ICD-10-CM

## 2019-03-05 DIAGNOSIS — I6300 Cerebral infarction due to thrombosis of unspecified precerebral artery: Secondary | ICD-10-CM

## 2019-03-05 LAB — URINALYSIS, ROUTINE W REFLEX MICROSCOPIC
Bilirubin Urine: NEGATIVE
Glucose, UA: NEGATIVE mg/dL
Hgb urine dipstick: NEGATIVE
Ketones, ur: NEGATIVE mg/dL
Nitrite: NEGATIVE
Protein, ur: NEGATIVE mg/dL
Specific Gravity, Urine: 1.017 (ref 1.005–1.030)
pH: 5 (ref 5.0–8.0)

## 2019-03-05 LAB — BASIC METABOLIC PANEL
Anion gap: 11 (ref 5–15)
BUN: 83 mg/dL — ABNORMAL HIGH (ref 8–23)
CO2: 19 mmol/L — ABNORMAL LOW (ref 22–32)
Calcium: 9.3 mg/dL (ref 8.9–10.3)
Chloride: 107 mmol/L (ref 98–111)
Creatinine, Ser: 1.44 mg/dL — ABNORMAL HIGH (ref 0.44–1.00)
GFR calc Af Amer: 39 mL/min — ABNORMAL LOW (ref >60)
GFR calc non Af Amer: 33 mL/min — ABNORMAL LOW (ref >60)
Glucose, Bld: 89 mg/dL (ref 70–99)
Potassium: 4.2 mmol/L (ref 3.5–5.1)
Sodium: 137 mmol/L (ref 135–145)

## 2019-03-05 LAB — CBC
HCT: 31.8 % — ABNORMAL LOW (ref 36.0–46.0)
Hemoglobin: 10 g/dL — ABNORMAL LOW (ref 12.0–15.0)
MCH: 29.3 pg (ref 26.0–34.0)
MCHC: 31.4 g/dL (ref 30.0–36.0)
MCV: 93.3 fL (ref 80.0–100.0)
Platelets: 214 10*3/uL (ref 150–400)
RBC: 3.41 MIL/uL — ABNORMAL LOW (ref 3.87–5.11)
RDW: 15.2 % (ref 11.5–15.5)
WBC: 7 10*3/uL (ref 4.0–10.5)
nRBC: 0 % (ref 0.0–0.2)

## 2019-03-05 LAB — LIPID PANEL
Cholesterol: 137 mg/dL (ref 0–200)
HDL: 48 mg/dL (ref >40)
LDL Cholesterol: 74 mg/dL (ref 0–99)
Total CHOL/HDL Ratio: 2.9 RATIO
Triglycerides: 75 mg/dL (ref <150)
VLDL: 15 mg/dL (ref 0–40)

## 2019-03-05 LAB — HEMOGLOBIN A1C
Hgb A1c MFr Bld: 5.9 % — ABNORMAL HIGH (ref 4.8–5.6)
Mean Plasma Glucose: 122.63 mg/dL

## 2019-03-05 LAB — MRSA PCR SCREENING: MRSA by PCR: NEGATIVE

## 2019-03-05 LAB — ECHOCARDIOGRAM COMPLETE
Height: 62 in
Weight: 3560 oz

## 2019-03-05 MED ORDER — CHLORHEXIDINE GLUCONATE 0.12 % MT SOLN
15.0000 mL | Freq: Two times a day (BID) | OROMUCOSAL | Status: DC
  Administered 2019-03-05 – 2019-03-10 (×11): 15 mL via OROMUCOSAL
  Filled 2019-03-05 (×10): qty 15

## 2019-03-05 MED ORDER — RESOURCE THICKENUP CLEAR PO POWD
ORAL | Status: DC | PRN
  Filled 2019-03-05: qty 125

## 2019-03-05 MED ORDER — FUROSEMIDE 10 MG/ML IJ SOLN: 20.0000 mg | Freq: Once | INTRAMUSCULAR | Status: DC

## 2019-03-05 MED ORDER — SODIUM CHLORIDE 0.9 % IV SOLN
INTRAVENOUS | Status: DC
  Administered 2019-03-06 – 2019-03-09 (×5): via INTRAVENOUS

## 2019-03-05 MED ORDER — ORAL CARE MOUTH RINSE
15.0000 mL | Freq: Two times a day (BID) | OROMUCOSAL | Status: DC
  Administered 2019-03-05 – 2019-03-08 (×7): 15 mL via OROMUCOSAL

## 2019-03-05 NOTE — Progress Notes (Signed)
Modified Barium Swallow Progress Note  Patient Details  Name: Stefanie [name] MRN: [id number] Date of Birth: [birth year]  Today's Date: 03/05/2019  Modified Barium Swallow completed.  Full report located under Chart Review in the Imaging Section.  Brief recommendations include the following:  Clinical Impression  Pt exhibited moderate oral and mild pharyngeal dysphagia with laryngeal penetration of thin and nectar. Pt has significantly diminished left facial sensation therefore she could not detect frank loss of boluses or residue with solid in buccal cavity. Swallow reached pt's valleculae and pyriform sinuses and remained briefly prior to initiating swallow. Penetration episodes with thin occured during the swallow intermittently flash and mild amount remained. Chin tuck did not appear to be effective with thin. Nectar thick penetration came close to her vocal cords without pt awareness. Increased timing and coordination of musculature with honey thick . Recommend pt initiate Dys 1, honey thick liquids, crush pills and full supervision. ST will continue to follow.         Swallow Evaluation Recommendations       SLP Diet Recommendations: Dysphagia 1 (Puree) solids;Honey thick liquids   Liquid Administration via: Cup;No straw   Medication Administration: Crushed with puree   Supervision: Staff to assist with self feeding;Full supervision/cueing for compensatory strategies   Compensations: Slow rate;Small sips/bites;Minimize environmental distractions;Clear throat intermittently   Postural Changes: Seated upright at 90 degrees   Oral Care Recommendations: Oral care BID   Other Recommendations: Order thickener from pharmacy    Houston Siren 03/05/2019,4:54 PM   Orbie Pyo [place].Ed Risk analyst [phone] Office [phone]
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
## 2019-03-05 NOTE — Progress Notes (Signed)
PT Cancellation Note  Patient Details Name: Stefanie [name] MRN: [id number] DOB: [birth year]   Cancelled Treatment:    Reason Eval/Treat Not Completed: Active bedrest order   Ellamae Sia, PT, DPT Acute Rehabilitation Services Pager [phone] Office [phone]    Willy Eddy 03/05/2019, 7:42 AM

## 2019-03-05 NOTE — H&P (Signed)
Neurology H&P  CC: Speech difficulty  History is obtained from: Patient  HPI: Stefanie [name] is a 85 y.o. female with a history of hypertension, hyperlipidemia, atrial fibrillation not on anticoagulation.  She was in her normal state of health until 6:30 PM.  At that time, she began having difficulty with speech and was taken emergently to [facility] where she was evaluated by tele-neurology.  She states that she does feel like she has some difficulty with understanding as well as with finding words.  She had a CT angiogram which does show either a distal M2 or proximal M3 occlusion on the right.   She is not considered an IR candidate due to mild symptoms and she was [facility].  She also complains of mild SOB, which is not unusual for her.   LKW: 6:30 PM tpa given?:  Yes IR Thrombectomy? No, mild symptoms Modified Rankin Scale: 1-No significant post stroke disability and can perform usual duties with stroke symptoms NIHSS: 4   ROS: A complete ROS was performed and is negative except as noted in the HPI.   Past Medical History:  Diagnosis Date  . Asthma   . Breast mass    Left  . Chronic kidney disease    Borderline with serum creatinine at the upper limit of normal in 10/2011  . Chronic low back pain   . Diverticular disease    acute diverticulitis in 02/2010  . Dyspnea on exertion 2010  . Gastroesophageal reflux disease    hiatal hernia  . Hyperlipidemia    Lipid profile 10/2011:240, 132, 52, 162  . Hypertension    Lab  10/2011: Normal CMet except GFR of 50, normal microalbumin, normal CBC, uric acid of 7.9, A1c of 6.1, normal urinalysis, low vitamin D; negative stress nuclear study in 2008  . Obesity   . Osteoporosis   . Paroxysmal atrial fibrillation (HCC)   . Sleep apnea 2010   Severe  . Syncope 06/2011  . Urinary incontinence    Interstitial cystitis     Family History  Problem Relation Age of Onset  . Heart attack  Mother   . Asthma Father   . Diabetes Father   . Heart failure Father   . Colitis Sister   . Heart failure Brother   . Pancreatic cancer Brother   . Diabetes Sister   . Transient ischemic attack Sister      Social History:  reports that she has never smoked. She has never used smokeless tobacco. She reports that she does not drink alcohol or use drugs.   Prior to Admission medications   Medication Sig Start Date End Date Taking? Authorizing Provider  allopurinol (ZYLOPRIM) 100 MG tablet Take 100 mg by mouth 2 (two) times daily.    [provider]  aspirin 81 MG tablet Take 81 mg by mouth 2 (two) times daily.     [provider]  bumetanide (BUMEX) 1 MG tablet Take 1 mg by mouth daily.      [provider]  cholecalciferol (VITAMIN D) 1000 UNITS tablet Take 1,000 Units by mouth daily.      [provider]  Choline Fenofibrate (TRILIPIX) 135 MG capsule Take 135 mg by mouth daily.      [provider]  colesevelam (WELCHOL) 625 MG tablet Take 1,875 mg by mouth 2 (two) times daily with a meal. Take 3 tablets twice daily    [provider]  docusate  sodium (COLACE) 100 MG capsule Take 200 mg by mouth 2 (two) times daily.    [provider]  gabapentin (NEURONTIN) 100 MG capsule Take 100 mg by mouth at bedtime. Take 2, 200 mg, at bedtime.    [provider]  losartan (COZAAR) 100 MG tablet TAKE (1) TABLET BY MOUTH ONCE DAILY. 05/18/13   Lendon Colonel, NP  metoprolol tartrate (LOPRESSOR) 25 MG tablet Take 25 mg by mouth 2 (two) times daily.      [provider]  Omega-3 Fatty Acids (SALMON OIL PO) Take 1,000 mg by mouth 2 (two) times daily.     [provider]  polyethylene glycol (MIRALAX / GLYCOLAX) packet Take 17 g by mouth daily.    [provider]  potassium chloride (KLOR-CON) 20 MEQ packet Take 20 mEq by mouth daily.      [provider]  traMADol (ULTRAM) 50 MG tablet Take 1  tablet (50 mg total) by mouth every 6 (six) hours as needed. 01/14/15   Virgel Manifold, MD     Exam: Current vital signs: BP 117/75   Pulse 82   Temp 97.9 F (36.6 C) (Oral)   Resp 19   Ht 5\' 2"  (1.575 m)   Wt 100.9 kg   SpO2 98%   BMI 40.70 kg/m    Physical Exam  Constitutional: Appears well-developed and well-nourished.  Psych: Affect appropriate to situation Eyes: No scleral injection HENT: No OP obstrucion Head: Normocephalic.  Cardiovascular: Normal rate and regular rhythm.  Respiratory: Effort normal GI: Soft.  No distension. There is no tenderness.  Skin: WDI  Neuro: Mental Status: Patient is awake, alert, oriented to person, place, month, year, and situation. Patient is able to give a clear and coherent history. She is able to name all simple objects, able to repeat without difficulty. She does not have any evidence of neglect. Cranial Nerves: II: Visual Fields are full. Pupils are equal, round, and reactive to light.   III,IV, VI: EOMI without ptosis or diploplia.  V: Facial sensation is symmetric to temperature VII: Facial movement with left facial weakness VIII: hearing is intact to voice X: Uvula elevates symmetrically XI: Shoulder shrug is symmetric. XII: tongue is midline without atrophy or fasciculations.  Motor: Tone is normal. Bulk is normal.  Though she just slightly in her right upper extremity, feels like full strength, mild 4/5 strength weakness in the left upper extremity, full strength in bilateral lower extremities Sensory: Sensation is diminished to light touch in the right upper extremity Cerebellar: No clear ataxia   I have reviewed labs in epic and the pertinent results are: Mild hyponatremia at 133 Mild hyperkalemia at 5.2 Mild AKI with creatinine of 1.5(last 1.2)  I have reviewed the images obtained: CT/CTA- distal M2 versus proximal M3 occlusion  Primary Diagnosis:  Cerebral infarction due to embolism of  right middle cerebral  artery.   Secondary Diagnosis: Acute on chronic systolic (congestive) heart failure, Paroxysmal atrial fibrillation, Morbid Obesity(BMI > 40), Acute Kidney Failure, CKD Stage 3 (GFR 30-59), Hyperkalemia  and Hyponatremia   Impression: 85 yo F with speech changes I suspect are related more to dysarthria than aphasia givne the right sided occlusion. Also possible is that her occlusion is chronic and she had a serperate aphasic episode tonight given that hse reports some difficulty with understanding as well. Nursing reports facial droop has worsened, so will get stat CT as well.   Plan: - HgbA1c, fasting lipid panel - MRI, MRA  of the brain without contrast - Frequent neuro checks - Echocardiogram - Carotid dopplers - Prophylactic therapy-none for 24 hours.  - Risk factor modification - Telemetry monitoring - PT consult, OT consult, Speech consult - Stroke team to follow  Vascular congestion/SOB Lasix x 1    This patient is critically ill and at significant risk of neurological worsening, death and care requires constant monitoring of vital signs, hemodynamics,respiratory and cardiac monitoring, neurological assessment, discussion with family, other specialists and medical decision making of high complexity. I spent 50 minutes of neurocritical care time  in the care of  this patient. This was time spent independent of any time provided by nurse practitioner or PA.  Ritta SlotMcNeill Zachery Niswander, MD [facility] [phone]  If 7pm- 7am, please page neurology on call as listed in AMION.

## 2019-03-05 NOTE — Progress Notes (Signed)
  Echocardiogram 2D Echocardiogram has been performed.  Johny Chess 03/05/2019, 3:32 PM

## 2019-03-05 NOTE — Progress Notes (Addendum)
STROKE TEAM PROGRESS NOTE   INTERVAL HISTORY I have personally reviewed history of presenting illness, electronic medical records and imaging films in PACS.  Patient presented with slurred speech and left-sided weakness and received IV TPA.  She had neurological worsening last night and had a repeat CT scan of the head which showed no acute abnormality or hemorrhage.  This morning she continues to have dysarthria and slurred speech and mild left-sided weakness.  Blood pressure has been adequately controlled.  Vitals:   03/05/19 0600 03/05/19 0630 03/05/19 0700 03/05/19 0800  BP: 122/73 (!) 109/55 (!) 102/48 (!) 129/38  Pulse: 70 73 68 70  Resp: 13 12 13 12   Temp:      TempSrc:      SpO2: 100% 99% 100% 100%  Weight:      Height:        CBC:  Recent Labs  Lab 03/04/19 1934 03/04/19 1944 03/05/19 0547  WBC 6.3  --  7.0  NEUTROABS 3.8  --   --   HGB 11.6* 13.3 10.0*  HCT 37.1 39.0 31.8*  MCV 93.0  --  93.3  PLT 222  --  782    Basic Metabolic Panel:  Recent Labs  Lab 03/04/19 1934 03/04/19 1944 03/05/19 0547  NA 133* 136 137  K 5.2* 5.2* 4.2  CL 103 107 107  CO2 21*  --  19*  GLUCOSE 106* 96 89  BUN 97* 96* 83*  CREATININE 1.52* 1.60* 1.44*  CALCIUM 9.4  --  9.3   Lipid Panel:     Component Value Date/Time   CHOL 137 03/05/2019 0547   TRIG 75 03/05/2019 0547   HDL 48 03/05/2019 0547   CHOLHDL 2.9 03/05/2019 0547   VLDL 15 03/05/2019 0547   LDLCALC 74 03/05/2019 0547   HgbA1c:  Lab Results  Component Value Date   HGBA1C 5.9 (H) 03/05/2019   Urine Drug Screen: No results found for: LABOPIA, COCAINSCRNUR, LABBENZ, AMPHETMU, THCU, LABBARB  Alcohol Level     Component Value Date/Time   ETH <10 03/04/2019 1934    IMAGING No results found.  PHYSICAL EXAM Frail elderly Caucasian lady is not in distress. . Afebrile. Head is nontraumatic. Neck is supple without bruit.    Cardiac exam no murmur or gallop. Lungs are clear to auscultation. Distal pulses are well  felt. Neurological Exam : She is awake alert oriented to time place and person.  She has moderate dysarthria but can be understood.  She is able to name repeat and comprehend well.  Extraocular movements are full range without nystagmus.  She has mild left lower facial weakness.  Tongue midline.  Motor system exam upper extremity exam limited due to frozen shoulder on the left but no obvious drift to the left hand distal muscles are quite weak.  Lower extremity strength mild weakness of the left leg 4/5.  Normal strength on the right.  Diminished touch pinprick sensation on the left hemibody.  Deep tendon reflexes symmetric.  Plantars downgoing.  Gait not tested. ASSESSMENT/PLAN Ms. CARISMA TROUPE is a 85 y.o. female with history of HTN, HLD and AF not on AC presenting to [facility] ED with speech difficulty - expressive and receptive. Received tPA 03/04/2019 at 2007. CTA w/ occluded R M2 or M3. Not at IR candidate d/t mild sx.   Stroke:   R MCA branch subcortical infarct d/t small vessel disease but cannot rule out embolic source secondary to known PAF not on anticoagulation  Code  Stroke CT head dense R M2 age indeterminate. No acute abnormality. ASPECTS 10.     CTA head & neck abrupt occlusion R M3 with scant distal irregular flow, age indeterminate  CT head punctate hyperdense focus region of R M3 occlusion  MRI pending  CT head possible small right MCA branch infarct.  No hemorrhage  2D Echo pending  LDL 74  HgbA1c 5.9  SCDs for VTE prophylaxis  aspirin 81 mg daily prior to admission, now on No antithrombotic as within 24h of tPA administration. Plan resume aspirin if 24h imaging neg for hemorrhage.   Therapy recommendations:  pending   Disposition:  pending   Hx Paroxysmal Atrial Fibrillation  Home anticoagulation:  none   On metoprolol 25 bid . Per Dr. Marvel Planothbart's note 11/01/2011 pt without documented AF. Previous episode occurred prior to 2008. No specific therapy  required.  Hypertension  Home meds:  Losartan 100, bumex 1  Stable BP goal per post tPA protocol x 24h following tPA administration . Long-term BP goal normotensive - has SBP goal < 150 per OP cardiologist  Hyperlipidemia  Home meds:  welchol 625, fenofibrate and omega 3  Intolerant to multiple statins. [facility] help w/ Welchol costs.  LDL 74, goal < 70  Resume how meds when able to swallow  Dysphagia . Secondary to stroke . NPO . Speech on board   Other Stroke Risk Factors  Advanced age  Morbid Obesity, Body mass index is 40.7 kg/m., recommend weight loss, diet and exercise as appropriate   Obstructive sleep apnea, on CPAP at home  Other Active Problems  Anemia Hb 10.0  CKD, dehydration -  Cre 1.44 - add IVF to 80h, received lasix this am. Home hold bumex. Recheck labs am  Hospital day # 1  I have personally obtained history,examined this patient, reviewed notes, independently viewed imaging studies, participated in medical decision making and plan of care.ROS completed by me personally and pertinent positives fully documented  I have made any additions or clarifications directly to the above note.  She presented with sudden onset of speech difficulties and left-sided weakness likely from embolic right MCA branch infarct due to atrial fibrillation while not being on anticoagulation.  She received IV TPA.  Recommend close neurological monitoring and strict blood pressure control as per post TPA protocol.  Check MRI scan of the brain later today.  Speech therapy for swallow eval.  Physical occupational therapy consult and mobilize out of bed.  Continue ongoing stroke work-up.  She may need anticoagulation at discharge.I spoke to pt`s daughter over phone and updated her and answered questions. This patient is critically ill and at significant risk of neurological worsening, death and care requires constant monitoring of vital signs,  hemodynamics,respiratory and cardiac monitoring, extensive review of multiple databases, frequent neurological assessment, discussion with family, other specialists and medical decision making of high complexity.I have made any additions or clarifications directly to the above note.This critical care time does not reflect procedure time, or teaching time or supervisory time of PA/NP/Med Resident etc but could involve care discussion time.  I spent 30 minutes of neurocritical care time  in the care of  this patient.      Delia HeadyPramod Sethi, MD Medical Director [facility] Pager: [phone] 03/05/2019 3:15 PM   To contact Stroke Continuity provider, please refer to [email or web address]. After hours, contact General Neurology
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
## 2019-03-05 NOTE — Evaluation (Addendum)
Clinical/Bedside Swallow Evaluation Patient Details  Name: Stefanie [name] MRN: [id number] Date of Birth: 01/08/1933  Today's Date: 03/05/2019 Time: SLP Start Time (ACUTE ONLY): 40980905 SLP Stop Time (ACUTE ONLY): 0935 SLP Time Calculation (min) (ACUTE ONLY): 30 min  Past Medical History:  Past Medical History:  Diagnosis Date  . Asthma   . Breast mass    Left  . Chronic kidney disease    Borderline with serum creatinine at the upper limit of normal in 10/2011  . Chronic low back pain   . Diverticular disease    acute diverticulitis in 02/2010  . Dyspnea on exertion 2010  . Gastroesophageal reflux disease    hiatal hernia  . Hyperlipidemia    Lipid profile 10/2011:240, 132, 52, 162  . Hypertension    Lab  10/2011: Normal CMet except GFR of 50, normal microalbumin, normal CBC, uric acid of 7.9, A1c of 6.1, normal urinalysis, low vitamin D; negative stress nuclear study in 2008  . Obesity   . Osteoporosis   . Paroxysmal atrial fibrillation (HCC)   . Sleep apnea 2010   Severe  . Syncope 06/2011  . Urinary incontinence    Interstitial cystitis   Past Surgical History:  Past Surgical History:  Procedure Laterality Date  . CATARACT EXTRACTION    . CHOLECYSTECTOMY  1993  . CHOLECYSTECTOMY  1993  . COLONOSCOPY  2008   Negative screening study  . DILATION AND CURETTAGE OF UTERUS  1986  . LUMBAR LAMINECTOMY  1992  . ORTHOPEDIC SURGERY    . TUBAL LIGATION     HPI:      Assessment / Plan / Recommendation Clinical Impression  Client presented with left facial hemiparalysis with no sensation in left forehead or cheek. Given the [place], she had extensive left side antierior spillage and poor awareness of the bolus. When drinking she reflexively produced a weak cough with a wet vocal quality, indicitive of possible aspiration, NPO recommended until MBS is completed. SLP Visit Diagnosis: Dysphagia, oral phase (R13.11)    Aspiration Risk  Severe aspiration risk;Risk for inadequate  nutrition/hydration    Diet Recommendation NPO except meds        Other  Recommendations Oral Care Recommendations: Oral care QID   Follow up Recommendations        Frequency and Duration            Prognosis        Swallow Study   General Type of Study: Bedside Swallow Evaluation Previous Swallow Assessment: (none) Diet Prior to this Study: NPO Temperature Spikes Noted: No Respiratory Status: Room air History of Recent Intubation: No Behavior/Cognition: Cooperative;Alert Oral Cavity Assessment: Within Functional Limits Oral Care Completed by SLP: Yes Oral Cavity - Dentition: Missing dentition;Poor condition(oral decay radiating from upper left tooth- tooth horizontal) Vision: Functional for self-feeding Self-Feeding Abilities: Needs assist Patient Positioning: Upright in bed Baseline Vocal Quality: Normal Volitional Cough: Weak    Oral/Motor/Sensory Function Overall Oral Motor/Sensory Function: Moderate impairment Facial ROM: Reduced left;Suspected CN VII (facial) dysfunction Facial Symmetry: Abnormal symmetry left;Suspected CN VII (facial) dysfunction Facial Strength: Reduced left;Suspected CN VII (facial) dysfunction Facial Sensation: Reduced left;Suspected CN V (Trigeminal) dysfunction Lingual ROM: Within Functional Limits Lingual Symmetry: Within Functional Limits Velum: Within Functional Limits Mandible: Within Functional Limits   Ice Chips Ice chips: Not tested   Thin Liquid Thin Liquid: Impaired Presentation: Cup Oral Phase Impairments: Reduced labial seal Oral Phase Functional Implications: Left anterior spillage Pharyngeal  Phase Impairments: Cough - Immediate  Nectar Thick Nectar Thick Liquid: Not tested   Honey Thick Honey Thick Liquid: Not tested   Puree Puree: Not tested   Solid     Solid: Not tested      Stefanie [name] 03/05/2019,12:39 PM

## 2019-03-06 LAB — BASIC METABOLIC PANEL
Anion gap: 9 (ref 5–15)
BUN: 63 mg/dL — ABNORMAL HIGH (ref 8–23)
CO2: 20 mmol/L — ABNORMAL LOW (ref 22–32)
Calcium: 9.2 mg/dL (ref 8.9–10.3)
Chloride: 112 mmol/L — ABNORMAL HIGH (ref 98–111)
Creatinine, Ser: 1.13 mg/dL — ABNORMAL HIGH (ref 0.44–1.00)
GFR calc Af Amer: 52 mL/min — ABNORMAL LOW (ref >60)
GFR calc non Af Amer: 45 mL/min — ABNORMAL LOW (ref >60)
Glucose, Bld: 98 mg/dL (ref 70–99)
Potassium: 4.1 mmol/L (ref 3.5–5.1)
Sodium: 141 mmol/L (ref 135–145)

## 2019-03-06 LAB — GLUCOSE, CAPILLARY
Glucose-Capillary: 61 mg/dL — ABNORMAL LOW (ref 70–99)
Glucose-Capillary: 101 mg/dL — ABNORMAL HIGH (ref 70–99)

## 2019-03-06 MED ORDER — GABAPENTIN 100 MG PO CAPS
100.0000 mg | ORAL_CAPSULE | Freq: Every day | ORAL | Status: DC
  Administered 2019-03-06 – 2019-03-09 (×4): 100 mg via ORAL
  Filled 2019-03-06 (×4): qty 1

## 2019-03-06 MED ORDER — KETOROLAC TROMETHAMINE 15 MG/ML IJ SOLN
15.0000 mg | Freq: Once | INTRAMUSCULAR | Status: AC
  Administered 2019-03-06: 15 mg via INTRAVENOUS
  Filled 2019-03-06: qty 1

## 2019-03-06 MED ORDER — ALLOPURINOL 100 MG PO TABS
100.0000 mg | ORAL_TABLET | Freq: Two times a day (BID) | ORAL | Status: DC
  Administered 2019-03-06 – 2019-03-10 (×8): 100 mg via ORAL
  Filled 2019-03-06 (×8): qty 1

## 2019-03-06 MED ORDER — COLCHICINE 0.6 MG PO TABS
0.6000 mg | ORAL_TABLET | Freq: Two times a day (BID) | ORAL | Status: DC
  Administered 2019-03-06 – 2019-03-10 (×8): 0.6 mg via ORAL
  Filled 2019-03-06 (×8): qty 1

## 2019-03-06 NOTE — Evaluation (Signed)
Speech Language Pathology Evaluation Patient Details Name: Stefanie [name] MRN: [id number] DOB: 07/08/1933 Today's Date: 03/06/2019 Time: 1530-1550 SLP Time Calculation (min) (ACUTE ONLY): 20 min  Problem List:  Patient Active Problem List   Diagnosis Date Noted  . 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 Sleep apnea   . Diverticular disease   . Paroxysmal atrial fibrillation (HCC)   . Hypertension   . Hyperlipidemia   . Syncope 06/29/2011  . Morbid obesity (HCC) 06/29/2011   Past Medical History:  Past Medical History:  Diagnosis Date  . Asthma   . Breast mass    Left  . Chronic kidney disease    Borderline with serum creatinine at the upper limit of normal in 10/2011  . Chronic low back pain   . Diverticular disease    acute diverticulitis in 02/2010  . Dyspnea on exertion 2010  . Gastroesophageal reflux disease    hiatal hernia  . Hyperlipidemia    Lipid profile 10/2011:240, 132, 52, 162  . Hypertension    Lab  10/2011: Normal CMet except GFR of 50, normal microalbumin, normal CBC, uric acid of 7.9, A1c of 6.1, normal urinalysis, low vitamin D; negative stress nuclear study in 2008  . Obesity   . Osteoporosis   . Paroxysmal atrial fibrillation (HCC)   . Sleep apnea 2010   Severe  . Syncope 06/2011  . Urinary incontinence    Interstitial cystitis   Past Surgical History:  Past Surgical History:  Procedure Laterality Date  . CATARACT EXTRACTION    . CHOLECYSTECTOMY  1993  . CHOLECYSTECTOMY  1993  . COLONOSCOPY  2008   Negative screening study  . DILATION AND CURETTAGE OF UTERUS  1986  . LUMBAR LAMINECTOMY  1992  . ORTHOPEDIC SURGERY    . TUBAL LIGATION     HPI:  85 y.o. female admitted for R CVA, presenting with suspected dysarthria and aphasia. On 8/19 she began having difficulty with understanding as well as with finding words. Reported mild SOB. On 8/20 13:12, she was alert and oriented, able to give a clear and coherent history, able to name simple objects and  repeat without difficulty, and did not have evidence of neglect. Left facial weakness and mild strengh weakness in left arm. Nursing reports facial droop has worsened. She has a history of hypertension, hyperlipidemia, atrial fibrillation not on anticoagulation. Barium esophagram 2015 with Diffuse age-related impairment of esophageal motility, Mild laryngeal penetration without aspiration, GERD.    Assessment / Plan / Recommendation Clinical Impression  Pt presents with acute cognitive linguistic difficulties post CVA, confirmed by both patient and pts daughter at bedside.  Dysarthria of speech noted with deficits in articulation and reduced breath support, impairing overall communication intelligibility. Receptive and expressive langauge skills appeared intact. Cognitive deficits c/b decreased visual attention with right gaze preference, reduced recall, decreased executive function skills, and reduced thought organization. PLOF pt was independent with certain ADLs including medicine management and financial management. Pt required assistance for physical ADLs and has intermittent hired caregiver help as well as family support. Continued ST intervention indicated for deficits noted.     SLP Assessment  SLP Recommendation/Assessment: Patient needs continued Speech Lanaguage Pathology Services SLP Visit Diagnosis: Cognitive communication deficit (R41.841);Dysarthria and anarthria (R47.1)    Follow Up Recommendations  Skilled Nursing facility    Frequency and Duration min 2x/week  2 weeks      SLP Evaluation Cognition  Overall Cognitive Status: Impaired/Different from baseline Arousal/Alertness: Awake/alert Orientation Level: Disoriented to time;Oriented  to person;Oriented to place Attention: Focused Focused Attention: Impaired Memory: Impaired Awareness: Impaired Problem Solving: Impaired Executive Function: Organizing;Sequencing Sequencing: Impaired       Comprehension  Auditory  Comprehension Overall Auditory Comprehension: Appears within functional limits for tasks assessed    Expression Expression Primary Mode of Expression: Verbal Verbal Expression Overall Verbal Expression: Appears within functional limits for tasks assessed Written Expression Dominant Hand: Right   Oral / Motor  Oral Motor/Sensory Function Overall Oral Motor/Sensory Function: Moderate impairment Facial ROM: Reduced left;Suspected CN VII (facial) dysfunction Facial Symmetry: Abnormal symmetry left;Suspected CN VII (facial) dysfunction Facial Strength: Reduced left;Suspected CN VII (facial) dysfunction Facial Sensation: Reduced left;Suspected CN V (Trigeminal) dysfunction Motor Speech Overall Motor Speech: Impaired Respiration: Impaired Articulation: Impaired Level of Impairment: Word Intelligibility: Intelligibility reduced Effective Techniques: Slow rate;Over-articulate;Pause;Increased vocal intensity   GO                    Stefanie Duet E Ardel Jagger MA, CCC-SLP Acute Rehabilitation Services  03/06/2019, 4:25 PM
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
## 2019-03-06 NOTE — Progress Notes (Signed)
  Speech Language Pathology Treatment: Dysphagia  Patient Details Name: Stefanie [name] MRN: [id number] DOB: [birth year] Today's Date: 03/06/2019 Time: 4010-2725 SLP Time Calculation (min) (ACUTE ONLY): 25 min  Assessment / Plan / Recommendation Clinical Impression  SLP followed up for diet tolerance. Per pt and daughter report, PO intake has been limited. Pt states "everything is too sweet". Reviewed findings during MBSS completed yesterday with pt and family as well as rationale for diet modifications. SLP assessed with ice chips and water following oral care. Pt with adequate mastication of ice chips, delayed oral transit, mild left sided anterior spillage with thin liquids via cup sip. Swallow initiation appeared delayed per palpation. No overt s/sx of aspiration were exhibited. Vocal quality remained clear. Recommend initiation of water and ice chips in between meals following oral care as tolerated. SLP to follow up.    HPI HPI: 85 y.o. female admitted for R CVA, presenting with suspected dysarthria and aphasia. On 8/19 she began having difficulty with understanding as well as with finding words. Reported mild SOB. On 8/20 13:12, she was alert and oriented, able to give a clear and coherent history, able to name simple objects and repeat without difficulty, and did not have evidence of neglect. Left facial weakness and mild strengh weakness in left arm. Nursing reports facial droop has worsened. She has a history of hypertension, hyperlipidemia, atrial fibrillation not on anticoagulation. Barium esophagram 2015 with Diffuse age-related impairment of esophageal motility, Mild laryngeal penetration without aspiration, GERD.       SLP Plan  Continue with current plan of care  Patient needs continued Speech Lanaguage Pathology Services    Recommendations  Diet recommendations: Honey-thick liquid;Dysphagia 1 (puree);Other(comment)(ice chips, water following oral care in between meals ) Liquids  provided via: Cup Medication Administration: Crushed with puree Supervision: Full supervision/cueing for compensatory strategies;Staff to assist with self feeding Compensations: Slow rate;Small sips/bites;Minimize environmental distractions;Clear throat intermittently;Lingual sweep for clearance of pocketing;Monitor for anterior loss Postural Changes and/or Swallow Maneuvers: Seated upright 90 degrees;Upright 30-60 min after meal                Oral Care Recommendations: Oral care BID;Oral care prior to ice chip/H20 Follow up Recommendations: Skilled Nursing facility SLP Visit Diagnosis: Dysphagia, oropharyngeal phase (R13.12) Plan: Continue with current plan of care       [place] MA, [place]  03/06/2019, 4:30 PM

## 2019-03-06 NOTE — Care Management Important Message (Signed)
Important Message  Patient Details  Name: Stefanie [name] MRN: [id number] Date of Birth: 1932-07-19   Medicare Important Message Given:  Yes     Reesha Debes [address] 03/06/2019, 4:13 PM

## 2019-03-06 NOTE — Progress Notes (Signed)
STROKE TEAM PROGRESS NOTE   INTERVAL HISTORY Patient was transferred to neurology floor bed last night.  She is done well overnight.  She is neurologically stable making good recovery.  Therapy has evaluated her and recommend skilled nursing facility for rehabilitation.  MRI scan of the brain done yesterday shows acute infarct right posterior insula and parietal lobe and corona radiata. No associated hemorrhage.  Echocardiogram showed normal ejection fraction without definite cardiac source of embolism.  She is complaining of pain in her right leg and heel and was given Tylenol which seems to have helped her.  BMP labs this morning show improvement in BUN from 83 to 63 and creatinine from 1.44 to 1.13 with hydration.  Vitals:   03/05/19 2257 03/06/19 0418 03/06/19 0902 03/06/19 1230  BP: (!) 116/47 (!) 126/55 129/62 139/63  Pulse: 98 (!) 106 (!) 103 99  Resp: 17 18 15 19   Temp: 97.7 F (36.5 C) 98.3 F (36.8 C) 98.3 F (36.8 C) 98.5 F (36.9 C)  TempSrc: Oral Oral Oral Oral  SpO2: 94% 95% 93% 95%  Weight:      Height:        CBC:  Recent Labs  Lab 03/04/19 1934 03/04/19 1944 03/05/19 0547  WBC 6.3  --  7.0  NEUTROABS 3.8  --   --   HGB 11.6* 13.3 10.0*  HCT 37.1 39.0 31.8*  MCV 93.0  --  93.3  PLT 222  --  214    Basic Metabolic Panel:  Recent Labs  Lab 03/05/19 0547 03/06/19 0144  NA 137 141  K 4.2 4.1  CL 107 112*  CO2 19* 20*  GLUCOSE 89 98  BUN 83* 63*  CREATININE 1.44* 1.13*  CALCIUM 9.3 9.2   Lipid Panel:     Component Value Date/Time   CHOL 137 03/05/2019 0547   TRIG 75 03/05/2019 0547   HDL 48 03/05/2019 0547   CHOLHDL 2.9 03/05/2019 0547   VLDL 15 03/05/2019 0547   LDLCALC 74 03/05/2019 0547   HgbA1c:  Lab Results  Component Value Date   HGBA1C 5.9 (H) 03/05/2019   Urine Drug Screen: No results found for: LABOPIA, COCAINSCRNUR, LABBENZ, AMPHETMU, THCU, LABBARB  Alcohol Level     Component Value Date/Time   ETH <10 03/04/2019 1934     IMAGING Ct Code Stroke Cta Head W/wo Contrast  Result Date: 03/05/2019 CLINICAL DATA:  Initial evaluation for acute headache, speech difficulty. EXAM: CT ANGIOGRAPHY HEAD AND NECK TECHNIQUE: Multidetector CT imaging of the head and neck was performed using the standard protocol during bolus administration of intravenous contrast. Multiplanar CT image reconstructions and MIPs were obtained to evaluate the vascular anatomy. Carotid stenosis measurements (when applicable) are obtained utilizing NASCET criteria, using the distal internal carotid diameter as the denominator. CONTRAST:  60mL OMNIPAQUE IOHEXOL 350 MG/ML SOLN COMPARISON:  Prior noncontrast head CT from earlier the same day. FINDINGS: CTA NECK FINDINGS Aortic arch: Visualized aortic arch of normal caliber with normal branch pattern. Moderate atherosclerotic change seen about the aortic arch and origin of the great vessels. No hemodynamically significant or high-grade stenosis. Penetrating atheromatous plaque noted at the undersurface of the aortic arch. Right carotid system: Right CCA tortuous and medialized into the retropharyngeal space, but widely patent to the carotid bifurcation without stenosis. Eccentric calcified plaque about the right bifurcation without hemodynamically significant stenosis. Right ICA tortuous but widely patent to the skull base without stenosis, dissection or occlusion. Left carotid system: Left CCA tortuous and medialized into  the retropharyngeal space but widely patent to the bifurcation without stenosis. Eccentric calcified plaque at the left bifurcation without hemodynamically significant stenosis. Left ICA tortuous but widely patent distally to the skull base without stenosis, dissection, or occlusion. Vertebral arteries: Both vertebral arteries arise from the subclavian arteries. Right vertebral artery dominant, with a diffusely hypoplastic left vertebral artery. Focal atheromatous narrowing of approximately 50% at  the origin of the dominant right vertebral artery noted (series 8, image 272). Right vertebral otherwise widely patent within the neck. Hypoplastic left vertebral artery widely patent as well without occlusion or other abnormality. Skeleton: No acute osseous finding. No discrete lytic or blastic osseous lesions. Mild to moderate cervical spondylolysis at C4-5 through C6-7. Other neck: No other acute soft tissue abnormality within the neck. Salivary glands within normal limits. Thyroid normal. No adenopathy. Upper chest: Visualized upper chest demonstrates no acute finding. Review of the MIP images confirms the above findings CTA HEAD FINDINGS Anterior circulation: Petrous segments patent bilaterally. Scattered atheromatous plaque within the cavernous/supraclinoid ICAs without hemodynamically significant stenosis. A1 segments widely patent. Normal anterior communicating artery. Anterior cerebral arteries widely patent to their distal aspects. M1 segments widely patent. Normal MCA bifurcations. On the right, there is abrupt occlusion of a distal right M2/M3 branch at the right sylvian fissure, corresponding with hypodensity seen on prior CT (series 8, image 75). Finding favored to reflect subocclusive thrombus, although a focal high-grade stenosis not entirely excluded some scant irregular flow seen distally. Otherwise, distal MCA branches well perfused without evidence for large vessel occlusion. Posterior circulation: Dominant right vertebral artery patent to the vertebrobasilar junction without stenosis. Hypoplastic left vertebral artery patent as well, although minimally contributes to the posterior circulation. Patent right PICA. Left PICA not well seen. Basilar widely patent to its distal aspect without stenosis. Superior cerebral arteries patent bilaterally. Both posterior cerebral arteries well perfused to their distal aspects without stenosis. Venous sinuses: Patent. Anatomic variants: None significant.  Review of the MIP images confirms the above findings IMPRESSION: 1. Abrupt occlusion/near occlusion of a distal right M2/proximal right M3 branch, with scant irregular flow distally. Finding corresponds with hyperdensity seen on prior noncontrast head CT, and favored to reflect subocclusive thrombus, although a focal high-grade stenosis not entirely excluded. Correlation with symptomatology recommended. 2. Scattered atherosclerotic change elsewhere throughout the major arterial vasculature of the head and neck. No other hemodynamically significant or correctable stenosis. 3. Diffuse tortuosity of the major arterial vasculature of the neck, suggesting chronic underlying hypertension. 4. Please note that this examination was interpreted during an unexpected Downtime, and dictated 1 day later on 03/05/2019. Critical Value/emergent results were called by telephone at the time of interpretation on 03/04/2019 at 9:17 pm to Dr. Nanda Quinton , who verbally acknowledged these results. Findings also discussed with Dr. Leonel Ramsay at approximately 9:30 p.m. on 03/04/2019. Electronically Signed   By: Jeannine Boga M.D.   On: 03/05/2019 19:55   Ct Head Wo Contrast  Result Date: 03/06/2019 CLINICAL DATA:  Stroke follow-up EXAM: CT HEAD WITHOUT CONTRAST TECHNIQUE: Contiguous axial images were obtained from the base of the skull through the vertex without intravenous contrast. COMPARISON:  CT and MRI March 05, 2019 same day FINDINGS: Brain: Motion artifact limits evaluation near the vertex. There is evolving hypoattenuation in the region of the right posterior insula, parietal lobe and corona radiata corresponding well to the region of acute infarct seen on same day comparison MRI. No evidence of hemorrhagic transformation. No new areas of hypoattenuation or large territory infarct.  Findings are on a background of patchy white matter hypoattenuation compatible with chronic microvascular angiopathy. Symmetric prominence of  the ventricles, cisterns and sulci compatible with parenchymal volume loss. Vascular: Atherosclerotic calcification of the carotid siphons and intradural vertebral arteries. No unexpected hyperdense vessel. Skull: Non motion degraded portions of the skull are unremarkable. Sinuses/Orbits: Paranasal sinuses and mastoid air cells are predominantly clear. Orbital structures are unremarkable aside from prior lens extractions. Other: None IMPRESSION: Expected evolution of the region of infarct seen in the right posterior insula, parietal lobe and corona radiata. No hemorrhagic transformation. Electronically Signed   By: Kreg ShropshirePrice  DeHay M.D.   On: 03/06/2019 01:48   Ct Head Wo Contrast  Result Date: 03/05/2019 CLINICAL DATA:  Post tPA, new facial droop EXAM: CT HEAD WITHOUT CONTRAST TECHNIQUE: Contiguous axial images were obtained from the base of the skull through the vertex without intravenous contrast. COMPARISON:  CT 03/04/2019 FINDINGS: Brain: Stable punctate calcification at the level of a right M3 branch artery where the vessel abruptly tapers on CT angiographic images. No free or differentiation loss or other CT evidence of acute large territory infarct. No acute hemorrhage. No mass effect or midline shift. Mild diffuse parenchymal volume loss is noted with prominence of the ventricles, cisterns and sulci. Patchy areas of white matter hypoattenuation are most compatible with chronic microvascular angiopathy. Vascular: Atherosclerotic calcification of the carotid siphons and intradural vertebral arteries. Skull: No calvarial fracture or suspicious osseous lesion. No scalp swelling or hematoma. Sinuses/Orbits: Paranasal sinuses and mastoid air cells are predominantly clear. Orbital structures are unremarkable aside from prior lens extractions. Other: None. IMPRESSION: No acute intracranial abnormality. No significant interval change from CT angiographic images performed 1 day prior. Stable punctate hyper density at  the level of a right M3 branch artery where the vessel abruptly tapers on CT angiographic images. Electronically Signed   By: Kreg ShropshirePrice  DeHay M.D.   On: 03/05/2019 22:15   Ct Code Stroke Cta Neck W/wo Contrast  Result Date: 03/05/2019 CLINICAL DATA:  Initial evaluation for acute headache, speech difficulty. EXAM: CT ANGIOGRAPHY HEAD AND NECK TECHNIQUE: Multidetector CT imaging of the head and neck was performed using the standard protocol during bolus administration of intravenous contrast. Multiplanar CT image reconstructions and MIPs were obtained to evaluate the vascular anatomy. Carotid stenosis measurements (when applicable) are obtained utilizing NASCET criteria, using the distal internal carotid diameter as the denominator. CONTRAST:  60mL OMNIPAQUE IOHEXOL 350 MG/ML SOLN COMPARISON:  Prior noncontrast head CT from earlier the same day. FINDINGS: CTA NECK FINDINGS Aortic arch: Visualized aortic arch of normal caliber with normal branch pattern. Moderate atherosclerotic change seen about the aortic arch and origin of the great vessels. No hemodynamically significant or high-grade stenosis. Penetrating atheromatous plaque noted at the undersurface of the aortic arch. Right carotid system: Right CCA tortuous and medialized into the retropharyngeal space, but widely patent to the carotid bifurcation without stenosis. Eccentric calcified plaque about the right bifurcation without hemodynamically significant stenosis. Right ICA tortuous but widely patent to the skull base without stenosis, dissection or occlusion. Left carotid system: Left CCA tortuous and medialized into the retropharyngeal space but widely patent to the bifurcation without stenosis. Eccentric calcified plaque at the left bifurcation without hemodynamically significant stenosis. Left ICA tortuous but widely patent distally to the skull base without stenosis, dissection, or occlusion. Vertebral arteries: Both vertebral arteries arise from the  subclavian arteries. Right vertebral artery dominant, with a diffusely hypoplastic left vertebral artery. Focal atheromatous narrowing of approximately 50% at  the origin of the dominant right vertebral artery noted (series 8, image 272). Right vertebral otherwise widely patent within the neck. Hypoplastic left vertebral artery widely patent as well without occlusion or other abnormality. Skeleton: No acute osseous finding. No discrete lytic or blastic osseous lesions. Mild to moderate cervical spondylolysis at C4-5 through C6-7. Other neck: No other acute soft tissue abnormality within the neck. Salivary glands within normal limits. Thyroid normal. No adenopathy. Upper chest: Visualized upper chest demonstrates no acute finding. Review of the MIP images confirms the above findings CTA HEAD FINDINGS Anterior circulation: Petrous segments patent bilaterally. Scattered atheromatous plaque within the cavernous/supraclinoid ICAs without hemodynamically significant stenosis. A1 segments widely patent. Normal anterior communicating artery. Anterior cerebral arteries widely patent to their distal aspects. M1 segments widely patent. Normal MCA bifurcations. On the right, there is abrupt occlusion of a distal right M2/M3 branch at the right sylvian fissure, corresponding with hypodensity seen on prior CT (series 8, image 75). Finding favored to reflect subocclusive thrombus, although a focal high-grade stenosis not entirely excluded some scant irregular flow seen distally. Otherwise, distal MCA branches well perfused without evidence for large vessel occlusion. Posterior circulation: Dominant right vertebral artery patent to the vertebrobasilar junction without stenosis. Hypoplastic left vertebral artery patent as well, although minimally contributes to the posterior circulation. Patent right PICA. Left PICA not well seen. Basilar widely patent to its distal aspect without stenosis. Superior cerebral arteries patent  bilaterally. Both posterior cerebral arteries well perfused to their distal aspects without stenosis. Venous sinuses: Patent. Anatomic variants: None significant. Review of the MIP images confirms the above findings IMPRESSION: 1. Abrupt occlusion/near occlusion of a distal right M2/proximal right M3 branch, with scant irregular flow distally. Finding corresponds with hyperdensity seen on prior noncontrast head CT, and favored to reflect subocclusive thrombus, although a focal high-grade stenosis not entirely excluded. Correlation with symptomatology recommended. 2. Scattered atherosclerotic change elsewhere throughout the major arterial vasculature of the head and neck. No other hemodynamically significant or correctable stenosis. 3. Diffuse tortuosity of the major arterial vasculature of the neck, suggesting chronic underlying hypertension. 4. Please note that this examination was interpreted during an unexpected Downtime, and dictated 1 day later on 03/05/2019. Critical Value/emergent results were called by telephone at the time of interpretation on 03/04/2019 at 9:17 pm to Dr. Alona Bene , who verbally acknowledged these results. Findings also discussed with Dr. Amada Jupiter at approximately 9:30 p.m. on 03/04/2019. Electronically Signed   By: Rise Mu M.D.   On: 03/05/2019 19:55   Mr Brain Wo Contrast  Result Date: 03/05/2019 CLINICAL DATA:  Stroke. EXAM: MRI HEAD WITHOUT CONTRAST TECHNIQUE: Multiplanar, multiecho pulse sequences of the brain and surrounding structures were obtained without intravenous contrast. COMPARISON:  CT head 03/05/2019 FINDINGS: Brain: Acute infarct in the right posterior insula and right parietal lobe and corona radiata. No associated hemorrhage. Mild atrophy. No hydrocephalus or midline shift. Negative for hemorrhage or mass. Mild chronic microvascular ischemic changes in the white matter. Vascular: Normal arterial flow voids Skull and upper cervical spine: Negative  Sinuses/Orbits: Mild mucosal edema paranasal sinuses. Bilateral cataract surgery Other: None IMPRESSION: Acute infarct right posterior insula and parietal lobe and corona radiata. No associated hemorrhage Atrophy and mild chronic microvascular ischemic change in the white matter. Electronically Signed   By: Marlan Palau M.D.   On: 03/05/2019 18:49   Dg Chest Portable 1 View  Result Date: 03/06/2019 CLINICAL DATA:  Shortness of breath EXAM: Chest portable one view COMPARISON:  06/29/2011 FINDINGS:  Cardiomegaly. Low lung volumes with mild vascular congestion. No overt edema, confluent opacities or effusions. No acute bony abnormality. IMPRESSION: Cardiomegaly with vascular congestion.  Low lung volumes. Electronically Signed   By: Charlett Nose M.D.   On: 03/05/2019 16:11   Dg Swallowing Func-speech Pathology  Result Date: 03/05/2019 Objective Swallowing Evaluation: Type of Study: MBS-Modified Barium Swallow Study  Patient Details Name: Stefanie [name] MRN: [id number] Date of Birth: 04/29/1933 Today's Date: 03/05/2019 Time: SLP Start Time (ACUTE ONLY): 1043 -SLP Stop Time (ACUTE ONLY): 1100 SLP Time Calculation (min) (ACUTE ONLY): 17 min Past Medical History: Past Medical History: Diagnosis Date . Asthma  . Breast mass   Left . Chronic kidney disease   Borderline with serum creatinine at the upper limit of normal in 10/2011 . Chronic low back pain  . Diverticular disease   acute diverticulitis in 02/2010 . Dyspnea on exertion 2010 . Gastroesophageal reflux disease   hiatal hernia . Hyperlipidemia   Lipid profile 10/2011:240, 132, 52, 162 . Hypertension   Lab  10/2011: Normal CMet except GFR of 50, normal microalbumin, normal CBC, uric acid of 7.9, A1c of 6.1, normal urinalysis, low vitamin D; negative stress nuclear study in 2008 . Obesity  . Osteoporosis  . Paroxysmal atrial fibrillation (HCC)  . Sleep apnea 2010  Severe . Syncope 06/2011 . Urinary incontinence   Interstitial cystitis Past Surgical History: Past  Surgical History: Procedure Laterality Date . CATARACT EXTRACTION   . CHOLECYSTECTOMY  1993 . CHOLECYSTECTOMY  1993 . COLONOSCOPY  2008  Negative screening study . DILATION AND CURETTAGE OF UTERUS  1986 . LUMBAR LAMINECTOMY  1992 . ORTHOPEDIC SURGERY   . TUBAL LIGATION   HPI: 85 y.o. female admitted for R CVA, presenting with suspected dysarthria and aphasia. On 8/19 she began having difficulty with understanding as well as with finding words. Reported mild SOB. On 8/20 13:12, she was alert and oriented, able to give a clear and coherent history, able to name simple objects and repeat without difficulty, and did not have evidence of neglect. Left facial weakness and mild strengh weakness in left arm. Nursing reports facial droop has worsened. She has a history of hypertension, hyperlipidemia, atrial fibrillation not on anticoagulation. Barium esophagram 2015 with Diffuse age-related impairment of esophageal motility, Mild laryngeal penetration without aspiration, GERD.  No data recorded Assessment / Plan / Recommendation CHL IP CLINICAL IMPRESSIONS 03/05/2019 Clinical Impression Pt exhibited moderate oral and mild pharyngeal dysphagia with laryngeal penetration of thin and nectar. Pt has significantly diminished left facial sensation therefore she could not detect frank loss of boluses or residue with solid in buccal cavity. Swallow reached pt's valleculae and pyriform sinuses and remained briefly prior to initiating swallow. Penetration episodes with thin occured during the swallow intermittently flash and mild amount remained. Chin tuck did not appear to be effective with thin. Nectar thick penetration came close to her vocal cords without pt awareness. Increased timing and coordination of musculature with honey thick . Recommend pt initiate Dys 1, honey thick liquids, crush pills and full supervision. ST will continue to follow.       SLP Visit Diagnosis Dysphagia, oropharyngeal phase (R13.12) Attention and  concentration deficit following -- Frontal lobe and executive function deficit following -- Impact on safety and function Moderate aspiration risk   CHL IP TREATMENT RECOMMENDATION 03/05/2019 Treatment Recommendations Therapy as outlined in treatment plan below   Prognosis 03/05/2019 Prognosis for Safe Diet Advancement Good Barriers to Reach Goals -- Barriers/Prognosis Comment -- CHL IP  DIET RECOMMENDATION 03/05/2019 SLP Diet Recommendations Dysphagia 1 (Puree) solids;Honey thick liquids Liquid Administration via Cup;No straw Medication Administration Crushed with puree Compensations Slow rate;Small sips/bites;Minimize environmental distractions;Clear throat intermittently Postural Changes Seated upright at 90 degrees   CHL IP OTHER RECOMMENDATIONS 03/05/2019 Recommended Consults -- Oral Care Recommendations Oral care BID Other Recommendations Order thickener from pharmacy   [facility] IP FOLLOW UP RECOMMENDATIONS 03/05/2019 Follow up Recommendations Skilled Nursing facility   [facility] IP FREQUENCY AND DURATION 03/05/2019 Speech Therapy Frequency (ACUTE ONLY) min 2x/week Treatment Duration 2 weeks      CHL IP ORAL PHASE 03/05/2019 Oral Phase Impaired Oral - Pudding Teaspoon -- Oral - Pudding Cup -- Oral - Honey Teaspoon -- Oral - Honey Cup -- Oral - Nectar Teaspoon -- Oral - Nectar Cup Left anterior bolus loss;Decreased bolus cohesion Oral - Nectar Straw -- Oral - Thin Teaspoon -- Oral - Thin Cup Left anterior bolus loss;Decreased bolus cohesion Oral - Thin Straw -- Oral - Puree -- Oral - Mech Soft -- Oral - Regular Delayed oral transit;Left pocketing in lateral sulci Oral - Multi-Consistency -- Oral - Pill -- Oral Phase - Comment --  CH[facility] IP PHARYNGEAL PHASE 03/05/2019 Pharyngeal Phase Impaired Pharyngeal- Pudding Teaspoon -- Pharyngeal -- Pharyngeal- Pudding Cup -- Pharyngeal -- Pharyngeal- Honey Teaspoon -- Pharyngeal -- Pharyngeal- Honey Cup -- Pharyngeal -- Pharyngeal- Nectar Teaspoon -- Pharyngeal -- Pharyngeal- Nectar Cup Delayed  swallow initiation-vallecula;Penetration/Aspiration during swallow Pharyngeal Material enters airway, remains ABOVE vocal cords and not ejected out Pharyngeal- Nectar Straw -- Pharyngeal -- Pharyngeal- Thin Teaspoon -- Pharyngeal -- Pharyngeal- Thin Cup Delayed swallow initiation-pyriform sinuses;Penetration/Aspiration during swallow Pharyngeal Material enters airway, remains ABOVE vocal cords and not ejected out Pharyngeal- Thin Straw -- Pharyngeal -- Pharyngeal- Puree -- Pharyngeal -- Pharyngeal- Mechanical Soft -- Pharyngeal -- Pharyngeal- Regular WFL Pharyngeal -- Pharyngeal- Multi-consistency -- Pharyngeal -- Pharyngeal- Pill -- Pharyngeal -- Pharyngeal Comment --  CHL IP CERVICAL ESOPHAGEAL PHASE 03/05/2019 Cervical Esophageal Phase WFL Pudding Teaspoon -- Pudding Cup -- Honey Teaspoon -- Honey Cup -- Nectar Teaspoon -- Nectar Cup -- Nectar Straw -- Thin Teaspoon -- Thin Cup -- Thin Straw -- Puree -- Mechanical Soft -- Regular -- Multi-consistency -- Pill -- Cervical Esophageal Comment -- Royce Macadamia 03/05/2019, 4:53 PM  Breck Coons Litaker M.Ed Sports administrator Pager [phone] Office [phone]             Ct Head Code Stroke Wo Contrast  Result Date: 03/05/2019 CLINICAL DATA:  Code stroke. Initial evaluation for acute headache, aphasia. EXAM: CT HEAD WITHOUT CONTRAST TECHNIQUE: Contiguous axial images were obtained from the base of the skull through the vertex without intravenous contrast. COMPARISON:  Prior CT from 06/29/2011. FINDINGS: Brain: Age-related cerebral atrophy with mild chronic small vessel ischemic disease. No acute intracranial hemorrhage. No definite evidence for acute or vaulting large vessel territory infarct. No mass lesion, midline shift or mass effect. No hydrocephalus. No extra-axial fluid collection. Vascular: Focal hyperdensity seen involving a distal right MCA branch at the right sylvian fissure (series 2, image 17), indeterminate, and could reflect focal  atherosclerotic calcification or possibly thrombus. No other hyperdense vessel. Insert siphon. Skull: Scalp soft tissues and calvarium within normal limits. Sinuses/Orbits: Globes and orbital soft tissues within normal limits. Paranasal sinuses and mastoids are clear. Other: None. ASPECTS [facility] Stroke Program Early CT Score) - Ganglionic level infarction (caudate, lentiform nuclei, internal capsule, insula, M1-M3 cortex): 7 - Supraganglionic infarction (M4-M6 cortex): 3 Total score (0-10 with 10 being normal): 10 IMPRESSION: 1.  Focal hyperdensity involving distal right MCA branches at the right sylvian fissure, age indeterminate, and may reflect focal atherosclerotic calcification or possibly intraluminal thrombus. Further assessment with CTA suggested. 2. ASPECTS is 10. 3. No other acute intracranial abnormality. 4. Underlying age-related cerebral atrophy with mild chronic small vessel ischemic disease. Critical Value/emergent results were called by telephone at the time of interpretation on 03/04/2019 at approximately 8:00 Pm to Dr. Alona Bene , who verbally acknowledged these results. Electronically Signed   By: Rise Mu M.D.   On: 03/05/2019 18:55    PHYSICAL EXAM Frail elderly Caucasian lady is not in distress. . Afebrile. Head is nontraumatic. Neck is supple without bruit.    Cardiac exam no murmur or gallop. Lungs are clear to auscultation. Distal pulses are well felt. Neurological Exam : She is awake alert oriented to time place and person.  She has moderate dysarthria but can be understood.  She is able to name repeat and comprehend well.  Extraocular movements are full range without nystagmus.  She has mild left lower facial weakness.  Tongue midline.  Motor system exam upper extremity exam limited due to frozen shoulder on the left but no obvious drift to the left hand distal muscles are quite weak.  Lower extremity strength mild weakness of the left leg 4/5.  Normal strength on the  right.  Diminished touch pinprick sensation on the left hemibody.  Deep tendon reflexes symmetric.  Plantars downgoing.  Gait not tested. ASSESSMENT/PLAN Ms. SHANEY DECKMAN is a 85 y.o. female with history of HTN, HLD and AF not on AC presenting to [facility] ED with speech difficulty - expressive and receptive. Received tPA 03/04/2019 at 2007. CTA w/ occluded R M2 or M3. Not at IR candidate d/t mild sx.   Stroke:   R MCA branch subcortical infarct d/t small vessel disease but cannot rule out embolic source secondary to known PAF not on anticoagulation  Code Stroke CT head dense R M2 age indeterminate. No acute abnormality. ASPECTS 10.     CTA head & neck abrupt occlusion R M3 with scant distal irregular flow, age indeterminate  CT head punctate hyperdense focus region of R M3 occlusion  MRI pending  CT head possible small right MCA branch infarct.  No hemorrhage  2D Echo normal  LDL 74  HgbA1c 5.9  SCDs for VTE prophylaxis  aspirin 81 mg daily prior to admission, now on No antithrombotic as within 24h of tPA administration. Plan resume aspirin if 24h imaging neg for hemorrhage.   Therapy recommendations:  SNF Disposition:  SNF Hx Paroxysmal Atrial Fibrillation  Home anticoagulation:  none   On metoprolol 25 bid . Per Dr. Marvel Plan note 11/01/2011 pt without documented AF. Previous episode occurred prior to 2008. No specific therapy required.  Hypertension  Home meds:  Losartan 100, bumex 1  Stable BP goal per post tPA protocol x 24h following tPA administration . Long-term BP goal normotensive - has SBP goal < 150 per OP cardiologist  Hyperlipidemia  Home meds:  welchol 625, fenofibrate and omega 3  Intolerant to multiple statins. [facility] pharmacist has attempted help w/ Welchol costs.  LDL 74, goal < 70  Resume how meds when able to swallow  Dysphagia . Secondary to stroke . NPO . Speech on board   Other Stroke Risk Factors  Advanced age  Morbid Obesity,  Body mass index is 40.7 kg/m., recommend weight loss, diet and exercise as appropriate   Obstructive sleep apnea, on CPAP at  home  Other Active Problems  Anemia Hb 10.0  CKD, dehydration -  Cre 1.44 - add IVF to 80h, received lasix this am. Home hold bumex. Recheck labs am  Hospital day # 2  Continue aspirin for now and start Eliquis tomorrow for stroke prevention for her history of A. fib.  Continue current management.  Consult Child psychotherapistsocial worker for nursing home placement hopefully early next week.  Use Tylenol for joint pain which seems to be helping. Greater than 50% time during this 25-minute visit was spent on counseling and coordination of care and discussion with care team   Delia HeadyPramod Cadarius Nevares, MD Medical Director [facility] Pager: [phone] 03/06/2019 2:54 PM   To contact Stroke Continuity provider, please refer to [email or web address]. After hours, contact General Neurology

## 2019-03-07 ENCOUNTER — Inpatient Hospital Stay (HOSPITAL_COMMUNITY): Payer: PPO

## 2019-03-07 DIAGNOSIS — I5032 Chronic diastolic (congestive) heart failure: Secondary | ICD-10-CM

## 2019-03-07 DIAGNOSIS — I63411 Cerebral infarction due to embolism of right middle cerebral artery: Principal | ICD-10-CM

## 2019-03-07 DIAGNOSIS — N183 Chronic kidney disease, stage 3 (moderate): Secondary | ICD-10-CM

## 2019-03-07 DIAGNOSIS — R1312 Dysphagia, oropharyngeal phase: Secondary | ICD-10-CM

## 2019-03-07 DIAGNOSIS — N179 Acute kidney failure, unspecified: Secondary | ICD-10-CM

## 2019-03-07 DIAGNOSIS — I480 Paroxysmal atrial fibrillation: Secondary | ICD-10-CM

## 2019-03-07 DIAGNOSIS — E785 Hyperlipidemia, unspecified: Secondary | ICD-10-CM

## 2019-03-07 DIAGNOSIS — I10 Essential (primary) hypertension: Secondary | ICD-10-CM

## 2019-03-07 LAB — BASIC METABOLIC PANEL
Anion gap: 9 (ref 5–15)
BUN: 46 mg/dL — ABNORMAL HIGH (ref 8–23)
CO2: 18 mmol/L — ABNORMAL LOW (ref 22–32)
Calcium: 9.1 mg/dL (ref 8.9–10.3)
Chloride: 118 mmol/L — ABNORMAL HIGH (ref 98–111)
Creatinine, Ser: 1.11 mg/dL — ABNORMAL HIGH (ref 0.44–1.00)
GFR calc Af Amer: 53 mL/min — ABNORMAL LOW (ref >60)
GFR calc non Af Amer: 46 mL/min — ABNORMAL LOW (ref >60)
Glucose, Bld: 95 mg/dL (ref 70–99)
Potassium: 4 mmol/L (ref 3.5–5.1)
Sodium: 145 mmol/L (ref 135–145)

## 2019-03-07 LAB — URINALYSIS, ROUTINE W REFLEX MICROSCOPIC
Bilirubin Urine: NEGATIVE
Glucose, UA: NEGATIVE mg/dL
Hgb urine dipstick: NEGATIVE
Ketones, ur: NEGATIVE mg/dL
Nitrite: NEGATIVE
Protein, ur: NEGATIVE mg/dL
Specific Gravity, Urine: 1.011 (ref 1.005–1.030)
pH: 5 (ref 5.0–8.0)

## 2019-03-07 LAB — CBC
HCT: 33.5 % — ABNORMAL LOW (ref 36.0–46.0)
Hemoglobin: 10.1 g/dL — ABNORMAL LOW (ref 12.0–15.0)
MCH: 29.1 pg (ref 26.0–34.0)
MCHC: 30.1 g/dL (ref 30.0–36.0)
MCV: 96.5 fL (ref 80.0–100.0)
Platelets: 185 10*3/uL (ref 150–400)
RBC: 3.47 MIL/uL — ABNORMAL LOW (ref 3.87–5.11)
RDW: 15.9 % — ABNORMAL HIGH (ref 11.5–15.5)
WBC: 6.9 10*3/uL (ref 4.0–10.5)
nRBC: 0 % (ref 0.0–0.2)

## 2019-03-07 MED ORDER — BUMETANIDE 1 MG PO TABS
1.0000 mg | ORAL_TABLET | Freq: Every day | ORAL | Status: DC
  Administered 2019-03-07 – 2019-03-10 (×4): 1 mg via ORAL
  Filled 2019-03-07 (×4): qty 1

## 2019-03-07 MED ORDER — ENOXAPARIN SODIUM 60 MG/0.6ML ~~LOC~~ SOLN
50.0000 mg | SUBCUTANEOUS | Status: DC
  Administered 2019-03-07 – 2019-03-09 (×3): 50 mg via SUBCUTANEOUS
  Filled 2019-03-07 (×3): qty 0.5

## 2019-03-07 MED ORDER — CALCIUM CARBONATE ANTACID 500 MG PO CHEW: 2.0000 | CHEWABLE_TABLET | Freq: Two times a day (BID) | ORAL | Status: DC | PRN

## 2019-03-07 MED ORDER — PANTOPRAZOLE SODIUM 40 MG PO TBEC
40.0000 mg | DELAYED_RELEASE_TABLET | Freq: Every day | ORAL | Status: DC
  Administered 2019-03-07 – 2019-03-10 (×4): 40 mg via ORAL
  Filled 2019-03-07 (×4): qty 1

## 2019-03-07 MED ORDER — COLESEVELAM HCL 625 MG PO TABS
1875.0000 mg | ORAL_TABLET | Freq: Two times a day (BID) | ORAL | Status: DC
  Administered 2019-03-09 – 2019-03-10 (×4): 1875 mg via ORAL
  Filled 2019-03-07 (×5): qty 3

## 2019-03-07 MED ORDER — METOPROLOL TARTRATE 25 MG PO TABS
25.0000 mg | ORAL_TABLET | Freq: Two times a day (BID) | ORAL | Status: DC
  Administered 2019-03-07 (×2): 25 mg via ORAL
  Filled 2019-03-07 (×2): qty 1

## 2019-03-07 MED ORDER — ASPIRIN EC 325 MG PO TBEC
325.0000 mg | DELAYED_RELEASE_TABLET | Freq: Every day | ORAL | Status: DC
  Administered 2019-03-07 – 2019-03-09 (×3): 325 mg via ORAL
  Filled 2019-03-07 (×3): qty 1

## 2019-03-07 MED ORDER — VITAMIN D 25 MCG (1000 UNIT) PO TABS
1000.0000 [IU] | ORAL_TABLET | Freq: Every day | ORAL | Status: DC
  Administered 2019-03-07 – 2019-03-10 (×4): 1000 [IU] via ORAL
  Filled 2019-03-07 (×4): qty 1

## 2019-03-07 NOTE — Discharge Summary (Addendum)
Patient ID: Stefanie [name]   MRN: [id number]      DOB: 1933/02/19  Date of Admission: 03/04/2019 Date of Discharge: 03/10/2019  Attending Physician:  Rosalin Hawking, MD, Stroke MD Consultant(s):   None  Patient's PCP:  Celene Squibb, MD  DISCHARGE DIAGNOSIS:  Principal Problem:   Stroke (cerebrum) ([place]) - R MCA embolic d/t AF s/p tPA Active Problems:   Morbid obesity (HCC)   Paroxysmal atrial fibrillation ([place])   Hypertension   Hyperlipidemia   Dysphagia due to recent cerebral infarction   Renal insufficiency   Anemia   Acute on chronic kidney failure [facility])  Past Medical History:  Diagnosis Date  . Asthma   . Breast mass    Left  . Chronic kidney disease    Borderline with serum creatinine at the upper limit of normal in 10/2011  . Chronic low back pain   . Diverticular disease    acute diverticulitis in 02/2010  . Dyspnea on exertion 2010  . Gastroesophageal reflux disease    hiatal hernia  . Hyperlipidemia    Lipid profile 10/2011:240, 132, 52, 162  . Hypertension    Lab  10/2011: Normal CMet except GFR of 50, normal microalbumin, normal CBC, uric acid of 7.9, A1c of 6.1, normal urinalysis, low vitamin D; negative stress nuclear study in 2008  . Obesity   . Osteoporosis   . Paroxysmal atrial fibrillation (HCC)   . Sleep apnea 2010   Severe  . Syncope 06/2011  . Urinary incontinence    Interstitial cystitis   Past Surgical History:  Procedure Laterality Date  . CATARACT EXTRACTION    . CHOLECYSTECTOMY  1993  . CHOLECYSTECTOMY  1993  . COLONOSCOPY  2008   Negative screening study  . DILATION AND CURETTAGE OF UTERUS  1986  . LUMBAR LAMINECTOMY  1992  . ORTHOPEDIC SURGERY    . TUBAL LIGATION      Family History Family History  Problem Relation Age of Onset  . Heart attack Mother   . Asthma Father   . Diabetes Father   . Heart failure Father   . Colitis Sister   . Heart failure Brother   . Pancreatic cancer Brother   . Diabetes Sister   . Transient  ischemic attack Sister     Social History  reports that she has never smoked. She has never used smokeless tobacco. She reports that she does not drink alcohol or use drugs.  Allergies as of 03/10/2019      Reactions   Ace Inhibitors Cough   Occurred with Monopril   Ciprofloxacin    Sick   Hydrocodone-acetaminophen Nausea Only   Lipitor [atorvastatin]    Tired, weak muscles   Monopril [fosinopril Sodium]    Cough   Morphine And Related    Nitrofurantoin Monohyd Macro Nausea Only   Also skin rash, edema   Percocet [oxycodone-acetaminophen] Nausea Only   Statins Other (See Comments)   Myalgias; prior exposure to pravastatin, atorvastatin, lovastatin, Lescol, simvastatin and ezetimibe   Flagyl [metronidazole] Nausea Only   Phenazopyridine Rash      Medication List    STOP taking these medications   ALPRAZolam 0.25 MG tablet Commonly known as: XANAX   aspirin 81 MG tablet   ibuprofen 200 MG tablet Commonly known as: ADVIL   irbesartan 300 MG tablet Commonly known as: AVAPRO   losartan 100 MG tablet Commonly known as: COZAAR   potassium chloride 20 MEQ packet Commonly known  as: KLOR-CON     TAKE these medications   allopurinol 100 MG tablet Commonly known as: ZYLOPRIM Take 100 mg by mouth 2 (two) times daily.   apixaban 5 MG Tabs tablet Commonly known as: ELIQUIS Take 1 tablet (5 mg total) by mouth 2 (two) times daily.   bumetanide 1 MG tablet Commonly known as: BUMEX Take 1 mg by mouth daily.   calcium carbonate 500 MG chewable tablet Commonly known as: TUMS - dosed in mg elemental calcium Chew 2 tablets by mouth 2 (two) times daily as needed for indigestion or heartburn.   colchicine 0.6 MG tablet Take 1 tablet (0.6 mg total) by mouth 2 (two) times daily. What changed:   when to take this  reasons to take this   colesevelam 625 MG tablet Commonly known as: WELCHOL Take 1,875 mg by mouth 2 (two) times daily with a meal.   gabapentin 100 MG  capsule Commonly known as: NEURONTIN Take 1 capsule (100 mg total) by mouth at bedtime. What changed: how much to take   metoprolol tartrate 50 MG tablet Commonly known as: LOPRESSOR Take 1 tablet (50 mg total) by mouth 2 (two) times daily. What changed:   medication strength  how much to take   pantoprazole 40 MG tablet Commonly known as: PROTONIX Take 40 mg by mouth daily.   polyethylene glycol 17 g packet Commonly known as: MIRALAX / GLYCOLAX Take 17 g by mouth daily as needed for mild constipation.   Resource ThickenUp Clear Powd Take 120 g by mouth as needed (for nectar thick liquids).   Vitamin D3 25 MCG tablet Commonly known as: Vitamin D Take 1 tablet (1,000 Units total) by mouth daily. Start taking on: March 11, 2019 What changed: medication strength       HOME MEDICATIONS PRIOR TO ADMISSION Medications Prior to Admission  Medication Sig Dispense Refill  . allopurinol (ZYLOPRIM) 100 MG tablet Take 100 mg by mouth 2 (two) times daily.    Marland Kitchen. ALPRAZolam (XANAX) 0.25 MG tablet Take 0.25 mg by mouth 3 (three) times daily as needed for anxiety.    Marland Kitchen. aspirin 81 MG tablet Take 81 mg by mouth daily.     . bumetanide (BUMEX) 1 MG tablet Take 1 mg by mouth daily.      . calcium carbonate (TUMS - DOSED IN MG ELEMENTAL CALCIUM) 500 MG chewable tablet Chew 2 tablets by mouth 2 (two) times daily as needed for indigestion or heartburn.    . cholecalciferol (VITAMIN D) 1000 UNITS tablet Take 1,000 Units by mouth daily.      . colchicine 0.6 MG tablet Take 0.6 mg by mouth 2 (two) times daily as needed (for five days when gout flaring up).    . colesevelam (WELCHOL) 625 MG tablet Take 1,875 mg by mouth 2 (two) times daily with a meal.     . gabapentin (NEURONTIN) 100 MG capsule Take 100-300 mg by mouth at bedtime.     Marland Kitchen. ibuprofen (ADVIL) 200 MG tablet Take 200-400 mg by mouth every 6 (six) hours as needed for mild pain.    Marland Kitchen. irbesartan (AVAPRO) 300 MG tablet Take 300 mg by mouth  daily.    . metoprolol tartrate (LOPRESSOR) 25 MG tablet Take 25 mg by mouth 2 (two) times daily.      . pantoprazole (PROTONIX) 40 MG tablet Take 40 mg by mouth daily.    . polyethylene glycol (MIRALAX / GLYCOLAX) packet Take 17 g by mouth daily as needed  for mild constipation.     . potassium chloride (KLOR-CON) 20 MEQ packet Take 20 mEq by mouth daily.      Marland Kitchen. losartan (COZAAR) 100 MG tablet TAKE (1) TABLET BY MOUTH ONCE DAILY. (Patient not taking: Reported on 03/06/2019) 30 tablet 6     HOSPITAL MEDICATIONS . allopurinol  100 mg Oral BID  . apixaban  5 mg Oral BID  . bumetanide  1 mg Oral Daily  . chlorhexidine  15 mL Mouth Rinse BID  . cholecalciferol  1,000 Units Oral Daily  . colchicine  0.6 mg Oral BID  . colesevelam  1,875 mg Oral BID WC  . gabapentin  100 mg Oral QHS  . mouth rinse  15 mL Mouth Rinse q12n4p  . metoprolol tartrate  50 mg Oral BID  . pantoprazole  40 mg Oral Daily    LABORATORY STUDIES CBC    Component Value Date/Time   WBC 7.1 03/10/2019 0443   RBC 3.34 (L) 03/10/2019 0443   HGB 9.8 (L) 03/10/2019 0443   HCT 32.7 (L) 03/10/2019 0443   PLT 173 03/10/2019 0443   MCV 97.9 03/10/2019 0443   MCH 29.3 03/10/2019 0443   MCHC 30.0 03/10/2019 0443   RDW 16.3 (H) 03/10/2019 0443   LYMPHSABS 1.7 03/04/2019 1934   MONOABS 0.6 03/04/2019 1934   EOSABS 0.2 03/04/2019 1934   BASOSABS 0.1 03/04/2019 1934   CMP    Component Value Date/Time   NA 148 (H) 03/10/2019 0443   K 3.9 03/10/2019 0443   CL 120 (H) 03/10/2019 0443   CO2 22 03/10/2019 0443   GLUCOSE 96 03/10/2019 0443   BUN 41 (H) 03/10/2019 0443   CREATININE 1.32 (H) 03/10/2019 0443   CALCIUM 8.5 (L) 03/10/2019 0443   PROT 6.9 03/04/2019 1934   ALBUMIN 4.0 03/04/2019 1934   AST 29 03/04/2019 1934   ALT 28 03/04/2019 1934   ALKPHOS 30 (L) 03/04/2019 1934   BILITOT 0.8 03/04/2019 1934   GFRNONAA 37 (L) 03/10/2019 0443   GFRAA 43 (L) 03/10/2019 0443   COAGS Lab Results  Component Value Date    INR 1.0 03/04/2019   INR 1.31 02/14/2010   Lipid Panel    Component Value Date/Time   CHOL 137 03/05/2019 0547   TRIG 75 03/05/2019 0547   HDL 48 03/05/2019 0547   CHOLHDL 2.9 03/05/2019 0547   VLDL 15 03/05/2019 0547   LDLCALC 74 03/05/2019 0547   HgbA1C  Lab Results  Component Value Date   HGBA1C 5.9 (H) 03/05/2019   Urinalysis    Component Value Date/Time   COLORURINE YELLOW 03/07/2019 1241   APPEARANCEUR CLEAR 03/07/2019 1241   LABSPEC 1.011 03/07/2019 1241   PHURINE 5.0 03/07/2019 1241   GLUCOSEU NEGATIVE 03/07/2019 1241   HGBUR NEGATIVE 03/07/2019 1241   BILIRUBINUR NEGATIVE 03/07/2019 1241   KETONESUR NEGATIVE 03/07/2019 1241   PROTEINUR NEGATIVE 03/07/2019 1241   UROBILINOGEN 0.2 02/14/2010 0231   NITRITE NEGATIVE 03/07/2019 1241   LEUKOCYTESUR LARGE (A) 03/07/2019 1241   Urine Drug Screen No results found for: LABOPIA, COCAINSCRNUR, LABBENZ, AMPHETMU, THCU, LABBARB  Alcohol Level    Component Value Date/Time   ETH <10 03/04/2019 1934     SIGNIFICANT DIAGNOSTIC STUDIES CT Head Code Stroke 03/05/2019 IMPRESSION: 1. Focal hyperdensity involving distal right MCA branches at the right sylvian fissure, age indeterminate, and may reflect focal atherosclerotic calcification or possibly intraluminal thrombus. Further assessment with CTA suggested. 2. ASPECTS is 10. 3. No other acute intracranial abnormality. 4.  Underlying age-related cerebral atrophy with mild chronic small vessel ischemic disease.   CT Angiogram Head And Neck 03/04/2019 IMPRESSION: 1. Abrupt occlusion/near occlusion of a distal right M2/proximal right M3 branch, with scant irregular flow distally. Finding corresponds with hyperdensity seen on prior noncontrast head CT, and favored to reflect subocclusive thrombus, although a focal high-grade stenosis not entirely excluded. Correlation with symptomatology recommended. 2. Scattered atherosclerotic change elsewhere throughout the major arterial  vasculature of the head and neck. No other hemodynamically significant or correctable stenosis. 3. Diffuse tortuosity of the major arterial vasculature of the neck, suggesting chronic underlying hypertension.  Mr Brain Wo Contrast 03/05/2019 IMPRESSION:  Acute infarct right posterior insula and parietal lobe and corona radiata. No associated hemorrhage Atrophy and mild chronic microvascular ischemic change in the white matter.   Ct Head Wo Contrast 03/06/2019 IMPRESSION:  Expected evolution of the region of infarct seen in the right posterior insula, parietal lobe and corona radiata. No hemorrhagic transformation.   Chest Portable 1 View  03/09/2019 IMPRESSION: Persistent bibasilar opacities with small bilateral pleural effusions. 03/07/2019 IMPRESSION: Unchanged AP portable examination with small bilateral layering pleural effusions and/or atelectasis. No new airspace opacity.Cardiomegaly.  03/05/2019 IMPRESSION: Cardiomegaly with vascular congestion.  Low lung volumes.    Transthoracic Echocardiogram  03/05/2019 IMPRESSIONS  1. The left ventricle has normal systolic function with an ejection fraction of 60-65%. The cavity size was normal. Left ventricular diastolic function could not be evaluated due to indeterminate diastolic function. Elevated left ventricular end-diastolic pressure.  2. The right ventricle has normal systolic function. The cavity was normal. There is no increase in right ventricular wall thickness.  3. There is moderate mitral annular calcification present.  4. The aortic valve is tricuspid. Mild sclerosis of the aortic valve.  5. The aorta is normal unless otherwise noted.  6. The interatrial septum appears to be lipomatous.   HISTORY OF PRESENT ILLNESS Stefanie [name] is a 85 y.o. female with a history of hypertension, hyperlipidemia, atrial fibrillation not on anticoagulation.  She was in her normal state of health until 6:30 PM on 03/04/2019 (LKW).  At that time, she  began having difficulty with speech and was taken emergently to [facility] where she was evaluated by tele-neurology.  She states that she does feel like she has some difficulty with understanding as well as with finding words.  She had a CT angiogram which does show either a distal M2 or proximal M3 occlusion on the right. She was administered IV tPA. She is not considered an IR candidate due to mild symptoms and she was brought to [facility] as direct admission. She also complains of mild SOB, which is not unusual for her. Modified Rankin Scale: 1. NIHSS: 4.  HOSPITAL COURSE Stefanie [name] is a 85 y.o. female with history of HTN, HLD and AF not on AC presenting to [facility] ED with speech difficulty - expressive and receptive. Received tPA 03/04/2019 at 2007. CTA w/ occluded R M2 or M3. Not at IR candidate d/t mild sx.   Stroke:   R MCA scattered infarct 2/2 left M3 occlusion, likely embolic, due to known PAF not on anticoagulation  Code Stroke CT head dense R M2 age indeterminate. No acute abnormality.     CTA head & neck abrupt occlusion R M3 with scant distal irregular flow  MRI right MCA infarcts  2D Echo EF 60-65%  Repeat CT expected evolution of infarct  LDL 74  HgbA1c 5.9  aspirin  81 mg daily prior to admission, now on eliquis 5 mg bid  for stroke prevention.   Therapy recommendations:  SNF  Disposition:  SNF  Hx Paroxysmal Atrial Fibrillation  Home anticoagulation:  none   On metoprolol 25 bid -> 50 bid  Per Dr. Marvel Planothbart's note 11/01/2011 pt without documented AF. Previous episode occurred prior to 2008. No specific therapy required.  Rate controlled   On eliquis 5 bid   Hypertension  Home meds:  irbesartan 300, bumex 1, metoprolol 25 bid  Stable   Increases metoprolol to 50 bid  Consider resuming irbesartan if BP becomes elevated  Long-term BP goal normotensive  Hyperlipidemia  Home meds:  welchol 625  Intolerant to multiple  statins  LDL 74, goal < 70  Continue welchol  Dysphagia  Secondary to stroke  Cleared for dysphagia 2 diet and nectar thick liquid on MBS day of d/c  Continue SLP follow up  Advance diet as able   AKI on CKD  Cre 1.60->1.44->1.13->1.11->1.13->1.29->1.32  Ok to d/c IVF   On Bumex  Encourage po intake  Other Stroke Risk Factors  Advanced age  Morbid Obesity, Body mass index is 40.7 kg/m., recommend weight loss, diet and exercise as appropriate   Obstructive sleep apnea, on CPAP at home  Other Active Problems  Anemia due to CKD, Hb 10.0->10.1->9.8  Hypernatremia Na 141->145->147 - continue IVF and encourage po intak - 147->148  SOB, desat - on duoneb. CXR no change ->much improved.   DISCHARGE EXAM Vitals:   03/10/19 0344 03/10/19 0700 03/10/19 1100 03/10/19 1500  BP: 120/61 (!) 142/63 97/60 (!) 145/58  Pulse: 73 80 60 77  Resp: 17 17 17 17   Temp: 98.3 F (36.8 C) 98.6 F (37 C) 99.2 F (37.3 C) 98.8 F (37.1 C)  TempSrc: Oral Axillary Axillary Axillary  SpO2: 99% 97% 99% 99%  Weight:      Height:      Frail elderly Caucasian lady is not in distress. . Afebrile. Head is nontraumatic. Neck is supple without bruit.    Cardiac exam no murmur or gallop. Lungs are clear to auscultation, improved expiratory wheezing. Distal pulses are well felt. Neurological Exam : She is awake alert oriented to time place and person.  She has mild dysarthria but can be understood.  She is able to name repeat and comprehend well.  Extraocular movements are full range without nystagmus.  She has left lower facial weakness.  Tongue midline.  Motor system exam LUE drift proximal and left hand grip is weak.  Lower extremity strength slight weakness of the left leg 4+/5.  Normal strength on the right.  Sensation symmetrical.  Deep tendon reflexes symmetric.  Plantars downgoing.  Gait not tested.  Discharge Diet   Dysphagia 2 nectar thick liquids   DISCHARGE PLAN  Disposition:   Skilled nursing facility for ongoing PT, OT and ST  Eliquis (apixaban) daily for secondary stroke prevention.  Encourage PO fluids to avoid dehydration  Recheck Na in a couple of days  Monitor BP. Consider resuming irbesartan if BP becomes elevated  Ongoing risk factor control by Primary Care Physician at time of discharge  Follow-up Benita StabileHall, John Z, MD in 2 weeks.  Follow-up in [facility] in 4 weeks, office to schedule an appointment.   45 minutes were spent preparing discharge.  Marvel PlanJindong Jini Horiuchi, MD PhD Stroke Neurology 03/10/2019 5:26 PM

## 2019-03-07 NOTE — [place] FL2 (Signed)
[place] MEDICAID FL2 LEVEL OF CARE SCREENING TOOL     IDENTIFICATION  Patient Name: Stefanie [name] Birthdate: 12/25/1932 Sex: female Admission Date (Current Location): 03/04/2019  [place]  [place] [id number] N Facility and Address:  [facility], [address], [place], [place] [address]      Provider Number: [id number]  Attending Physician Name and Address:  Marvel PlanXu, Jindong, MD  Relative Name and Phone Number:  Elpidio GaleaJanet Brame, Daughter, [phone]    Current Level of Care: Hospital Recommended Level of Care: Skilled Nursing Facility Prior Approval Number:    Date Approved/Denied:   PASRR Number: [id number]  Discharge Plan: SNF    Current Diagnoses: Patient Active Problem List   Diagnosis Date Noted  . 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 Sleep apnea   . Diverticular disease   . Paroxysmal atrial fibrillation (HCC)   . Hypertension   . Hyperlipidemia   . Syncope 06/29/2011  . Morbid obesity (HCC) 06/29/2011    Orientation RESPIRATION BLADDER Height & Weight     Self, Time, Situation, Place  O2, Other (Comment)(94, [place], 2L; Uses BiPAP at night. Unsure of home settings. Full face mask, flow rate 2) Continent, External catheter Weight: 222 lb 8 oz (100.9 kg) Height:  5\' 2"  (157.5 cm)  BEHAVIORAL SYMPTOMS/MOOD NEUROLOGICAL BOWEL NUTRITION STATUS      Continent Diet(Dys 1 diet, thin liquids)  AMBULATORY STATUS COMMUNICATION OF NEEDS Skin   Extensive Assist Verbally Skin abrasions, Bruising, Other (Comment)(abrasion on left arm, bruising on right arm, excoriated skin on hip, MASD on thigh)                       Personal Care Assistance Level of Assistance  Bathing, Feeding, Dressing, Total care Bathing Assistance: Limited assistance Feeding assistance: Independent Dressing Assistance: Limited assistance Total Care Assistance: Limited assistance   Functional Limitations Info  Sight, Hearing, Speech Sight Info:  Impaired(Wears glasses) Hearing Info: Adequate Speech Info: Adequate    SPECIAL CARE FACTORS FREQUENCY  PT (By licensed PT), OT (By licensed OT), Speech therapy     PT Frequency: 5x/wk OT Frequency: 5x/wk     Speech Therapy Frequency: 2x/wk      Contractures Contractures Info: Not present    Additional Factors Info  Code Status, Allergies Code Status Info: Full Code Allergies Info: Ace Inhibitors, Ciprofloxacin, Hydrocodone-acetaminophen, Lipitor (Atorvastatin), Monopril (Fosinopril Sodium), Morphine And Related, Nitrofurantoin Monohyd Macro, Percocet (Oxycodone-acetaminophen), Statins, Flagyl (Metronidazole), Phenazopyridine           Current Medications (03/07/2019):  This is the current hospital active medication list Current Facility-Administered Medications  Medication Dose Route Frequency Provider Last Rate Last Dose  . 0.9 %  sodium chloride infusion   Intravenous Continuous Marvel PlanXu, Jindong, MD 40 mL/hr at 03/07/19 1612    . acetaminophen (TYLENOL) tablet 650 mg  650 mg Oral Q4H PRN Layne BentonBiby, Sharon L, NP   650 mg at 03/07/19 0350   Or  . acetaminophen (TYLENOL) solution 650 mg  650 mg Per Tube Q4H PRN Layne BentonBiby, Sharon L, NP   650 mg at 03/05/19 2140   Or  . acetaminophen (TYLENOL) suppository 650 mg  650 mg Rectal Q4H PRN Layne BentonBiby, Sharon L, NP      . allopurinol (ZYLOPRIM) tablet 100 mg  100 mg Oral BID Micki RileySethi, Pramod S, MD   100 mg at 03/07/19 [id number]  . aspirin EC tablet 325 mg  325 mg Oral Daily Marvel PlanXu, Jindong, MD   325  mg at 03/07/19 0929  . bumetanide (BUMEX) tablet 1 mg  1 mg Oral Daily Rosalin Hawking, MD   1 mg at 03/07/19 0950  . calcium carbonate (TUMS - dosed in mg elemental calcium) chewable tablet 400 mg of elemental calcium  2 tablet Oral BID PRN Rosalin Hawking, MD      . chlorhexidine (PERIDEX) 0.12 % solution 15 mL  15 mL Mouth Rinse BID Burnetta Sabin L, NP   15 mL at 03/07/19 0950  . cholecalciferol (VITAMIN D3) tablet 1,000 Units  1,000 Units Oral Daily Rosalin Hawking, MD   1,000  Units at 03/07/19 0950  . colchicine tablet 0.6 mg  0.6 mg Oral BID Garvin Fila, MD   0.6 mg at 03/07/19 0950  . [START ON 03/09/2019] colesevelam [facility]) tablet 1,875 mg  1,875 mg Oral BID WC Rosalin Hawking, MD      . enoxaparin (LOVENOX) injection 50 mg  50 mg Subcutaneous Q24H Rosalin Hawking, MD   50 mg at 03/07/19 1206  . gabapentin (NEURONTIN) capsule 100 mg  100 mg Oral QHS Garvin Fila, MD   100 mg at 03/06/19 2145  . MEDLINE mouth rinse  15 mL Mouth Rinse q12n4p Biby, Sharon L, NP   15 mL at 03/07/19 1540  . metoprolol tartrate (LOPRESSOR) tablet 25 mg  25 mg Oral BID Rosalin Hawking, MD   25 mg at 03/07/19 3338  . pantoprazole (PROTONIX) EC tablet 40 mg  40 mg Oral Daily Rosalin Hawking, MD   40 mg at 03/07/19 0929  . Resource ThickenUp Clear   Oral PRN Donzetta Starch, NP         Discharge Medications: Please see discharge summary for a list of discharge medications.  Relevant Imaging Results:  Relevant Lab Results:   Additional Information SSN: [id number]  Philippa Chester Motty Borin, LCSWA

## 2019-03-07 NOTE — Progress Notes (Signed)
STROKE TEAM PROGRESS NOTE   INTERVAL HISTORY Patient RN at bedside. Pt just finished bath, mild SOB. Still has left facial droop and left UE weakness. AAO x3. Complains of hip pain.   Vitals:   03/06/19 2313 03/06/19 2349 03/07/19 0351 03/07/19 0440  BP: (!) 141/51  (!) 191/66 (!) 148/82  Pulse: (!) 120 91 (!) 112 90  Resp: 15 13 14 16   Temp: 98.8 F (37.1 C)     TempSrc: Oral     SpO2: 96% 96% 97% 95%  Weight:      Height:        CBC:  Recent Labs  Lab 03/04/19 1934 03/04/19 1944 03/05/19 0547  WBC 6.3  --  7.0  NEUTROABS 3.8  --   --   HGB 11.6* 13.3 10.0*  HCT 37.1 39.0 31.8*  MCV 93.0  --  93.3  PLT 222  --  237    Basic Metabolic Panel:  Recent Labs  Lab 03/05/19 0547 03/06/19 0144  NA 137 141  K 4.2 4.1  CL 107 112*  CO2 19* 20*  GLUCOSE 89 98  BUN 83* 63*  CREATININE 1.44* 1.13*  CALCIUM 9.3 9.2   Lipid Panel:     Component Value Date/Time   CHOL 137 03/05/2019 0547   TRIG 75 03/05/2019 0547   HDL 48 03/05/2019 0547   CHOLHDL 2.9 03/05/2019 0547   VLDL 15 03/05/2019 0547   LDLCALC 74 03/05/2019 0547   HgbA1c:  Lab Results  Component Value Date   HGBA1C 5.9 (H) 03/05/2019   Urine Drug Screen: No results found for: LABOPIA, COCAINSCRNUR, LABBENZ, AMPHETMU, THCU, LABBARB  Alcohol Level     Component Value Date/Time   ETH <10 03/04/2019 1934    IMAGING Ct Head Wo Contrast  Result Date: 03/06/2019 CLINICAL DATA:  Stroke follow-up EXAM: CT HEAD WITHOUT CONTRAST TECHNIQUE: Contiguous axial images were obtained from the base of the skull through the vertex without intravenous contrast. COMPARISON:  CT and MRI March 05, 2019 same day FINDINGS: Brain: Motion artifact limits evaluation near the vertex. There is evolving hypoattenuation in the region of the right posterior insula, parietal lobe and corona radiata corresponding well to the region of acute infarct seen on same day comparison MRI. No evidence of hemorrhagic transformation. No new areas  of hypoattenuation or large territory infarct. Findings are on a background of patchy white matter hypoattenuation compatible with chronic microvascular angiopathy. Symmetric prominence of the ventricles, cisterns and sulci compatible with parenchymal volume loss. Vascular: Atherosclerotic calcification of the carotid siphons and intradural vertebral arteries. No unexpected hyperdense vessel. Skull: Non motion degraded portions of the skull are unremarkable. Sinuses/Orbits: Paranasal sinuses and mastoid air cells are predominantly clear. Orbital structures are unremarkable aside from prior lens extractions. Other: None IMPRESSION: Expected evolution of the region of infarct seen in the right posterior insula, parietal lobe and corona radiata. No hemorrhagic transformation. Electronically Signed   By: Lovena Le M.D.   On: 03/06/2019 01:48   Mr Brain Wo Contrast  Result Date: 03/05/2019 CLINICAL DATA:  Stroke. EXAM: MRI HEAD WITHOUT CONTRAST TECHNIQUE: Multiplanar, multiecho pulse sequences of the brain and surrounding structures were obtained without intravenous contrast. COMPARISON:  CT head 03/05/2019 FINDINGS: Brain: Acute infarct in the right posterior insula and right parietal lobe and corona radiata. No associated hemorrhage. Mild atrophy. No hydrocephalus or midline shift. Negative for hemorrhage or mass. Mild chronic microvascular ischemic changes in the white matter. Vascular: Normal arterial flow voids Skull and  upper cervical spine: Negative Sinuses/Orbits: Mild mucosal edema paranasal sinuses. Bilateral cataract surgery Other: None IMPRESSION: Acute infarct right posterior insula and parietal lobe and corona radiata. No associated hemorrhage Atrophy and mild chronic microvascular ischemic change in the white matter. Electronically Signed   By: Marlan Palauharles  Clark M.D.   On: 03/05/2019 18:49   Dg Swallowing Func-speech Pathology  Result Date: 03/05/2019 Objective Swallowing Evaluation: Type of Study:  MBS-Modified Barium Swallow Study  Patient Details Name: Stefanie BlueRosa I Cravey MRN: [id number] Date of Birth: 07/21/1932 Today's Date: 03/05/2019 Time: SLP Start Time (ACUTE ONLY): 1043 -SLP Stop Time (ACUTE ONLY): 1100 SLP Time Calculation (min) (ACUTE ONLY): 17 min Past Medical History: Past Medical History: Diagnosis Date . Asthma  . Breast mass   Left . Chronic kidney disease   Borderline with serum creatinine at the upper limit of normal in 10/2011 . Chronic low back pain  . Diverticular disease   acute diverticulitis in 02/2010 . Dyspnea on exertion 2010 . Gastroesophageal reflux disease   hiatal hernia . Hyperlipidemia   Lipid profile 10/2011:240, 132, 52, 162 . Hypertension   Lab  10/2011: Normal CMet except GFR of 50, normal microalbumin, normal CBC, uric acid of 7.9, A1c of 6.1, normal urinalysis, low vitamin D; negative stress nuclear study in 2008 . Obesity  . Osteoporosis  . Paroxysmal atrial fibrillation (HCC)  . Sleep apnea 2010  Severe . Syncope 06/2011 . Urinary incontinence   Interstitial cystitis Past Surgical History: Past Surgical History: Procedure Laterality Date . CATARACT EXTRACTION   . CHOLECYSTECTOMY  1993 . CHOLECYSTECTOMY  1993 . COLONOSCOPY  2008  Negative screening study . DILATION AND CURETTAGE OF UTERUS  1986 . LUMBAR LAMINECTOMY  1992 . ORTHOPEDIC SURGERY   . TUBAL LIGATION   HPI: 85 y.o. female admitted for R CVA, presenting with suspected dysarthria and aphasia. On 8/19 she began having difficulty with understanding as well as with finding words. Reported mild SOB. On 8/20 13:12, she was alert and oriented, able to give a clear and coherent history, able to name simple objects and repeat without difficulty, and did not have evidence of neglect. Left facial weakness and mild strengh weakness in left arm. Nursing reports facial droop has worsened. She has a history of hypertension, hyperlipidemia, atrial fibrillation not on anticoagulation. Barium esophagram 2015 with Diffuse age-related  impairment of esophageal motility, Mild laryngeal penetration without aspiration, GERD.  No data recorded Assessment / Plan / Recommendation CHL IP CLINICAL IMPRESSIONS 03/05/2019 Clinical Impression Pt exhibited moderate oral and mild pharyngeal dysphagia with laryngeal penetration of thin and nectar. Pt has significantly diminished left facial sensation therefore she could not detect frank loss of boluses or residue with solid in buccal cavity. Swallow reached pt's valleculae and pyriform sinuses and remained briefly prior to initiating swallow. Penetration episodes with thin occured during the swallow intermittently flash and mild amount remained. Chin tuck did not appear to be effective with thin. Nectar thick penetration came close to her vocal cords without pt awareness. Increased timing and coordination of musculature with honey thick . Recommend pt initiate Dys 1, honey thick liquids, crush pills and full supervision. ST will continue to follow.       SLP Visit Diagnosis Dysphagia, oropharyngeal phase (R13.12) Attention and concentration deficit following -- Frontal lobe and executive function deficit following -- Impact on safety and function Moderate aspiration risk   CHL IP TREATMENT RECOMMENDATION 03/05/2019 Treatment Recommendations Therapy as outlined in treatment plan below   Prognosis 03/05/2019 Prognosis for Safe  Diet Advancement Good Barriers to Reach Goals -- Barriers/Prognosis Comment -- CHL IP DIET RECOMMENDATION 03/05/2019 SLP Diet Recommendations Dysphagia 1 (Puree) solids;Honey thick liquids Liquid Administration via Cup;No straw Medication Administration Crushed with puree Compensations Slow rate;Small sips/bites;Minimize environmental distractions;Clear throat intermittently Postural Changes Seated upright at 90 degrees   CHL IP OTHER RECOMMENDATIONS 03/05/2019 Recommended Consults -- Oral Care Recommendations Oral care BID Other Recommendations Order thickener from pharmacy   CHL IP FOLLOW UP  RECOMMENDATIONS 03/05/2019 Follow up Recommendations Skilled Nursing facility   [facility]QUENC[facility] D[facility] 03/05/2019 Speech Therapy Frequency (ACUTE ONLY) min 2x/week Treatment Duration 2 weeks      CHL IP ORAL PHASE 03/05/2019 Oral Phase Impaired Oral - Pudding Teaspoon -- Oral - Pudding Cup -- Oral - Honey Teaspoon -- Oral - Honey Cup -- Oral - Nectar Teaspoon -- Oral - Nectar Cup Left anterior bolus loss;Decreased bolus cohesion Oral - Nectar Straw -- Oral - Thin Teaspoon -- Oral - Thin Cup Left anterior bolus loss;Decreased bolus cohesion Oral - Thin Straw -- Oral - Puree -- Oral - Mech Soft -- Oral - Regular Delayed oral transit;Left pocketing in lateral sulci Oral - Multi-Consistency -- Oral - Pill -- Oral Phase - Comment --  CHL IP PHARYNGEAL PHASE 03/05/2019 Pharyngeal Phase Impaired Pharyngeal- Pudding Teaspoon -- Pharyngeal -- Pharyngeal- Pudding Cup -- Pharyngeal -- Pharyngeal- Honey Teaspoon -- Pharyngeal -- Pharyngeal- Honey Cup -- Pharyngeal -- Pharyngeal- Nectar Teaspoon -- Pharyngeal -- Pharyngeal- Nectar Cup Delayed swallow initiation-vallecula;Penetration/Aspiration during swallow Pharyngeal Material enters airway, remains ABOVE vocal cords and not ejected out Pharyngeal- Nectar Straw -- Pharyngeal -- Pharyngeal- Thin Teaspoon -- Pharyngeal -- Pharyngeal- Thin Cup Delayed swallow initiation-pyriform sinuses;Penetration/Aspiration during swallow Pharyngeal Material enters airway, remains ABOVE vocal cords and not ejected out Pharyngeal- Thin Straw -- Pharyngeal -- Pharyngeal- Puree -- Pharyngeal -- Pharyngeal- Mechanical Soft -- Pharyngeal -- Pharyngeal- Regular WFL Pharyngeal -- Pharyngeal- Multi-consistency -- Pharyngeal -- Pharyngeal- Pill -- Pharyngeal -- Pharyngeal Comment --  CHL IP CERVICAL ESOPHAGEAL PHASE 03/05/2019 Cervical Esophageal Phase WFL Pudding Teaspoon -- Pudding Cup -- Honey Teaspoon -- Honey Cup -- Nectar Teaspoon -- Nectar Cup -- Nectar Straw -- Thin Teaspoon -- Thin Cup -- Thin  Straw -- Puree -- Mechanical Soft -- Regular -- Multi-consistency -- Pill -- Cervical Esophageal Comment -- Stefanie [name], Stefanie [name] 03/05/2019, 4:53 PM  Breck CoonsLisa [name] Litaker M.Ed Sports administratorCCC-SLP Speech-Language Pathologist Pager [phone] Office [phone]              PHYSICAL EXAM Frail elderly Caucasian lady is not in distress. . Afebrile. Head is nontraumatic. Neck is supple without bruit.    Cardiac exam no murmur or gallop. Lungs are clear to auscultation, but prolonged expiration. Distal pulses are well felt.  Neurological Exam : She is awake alert oriented to time place and person.  She has mild dysarthria but can be understood.  She is able to name repeat and comprehend well.  Extraocular movements are full range without nystagmus.  She has left lower facial weakness.  Tongue midline.  Motor system exam LUE drift proximal and left hand grip is weak.  Lower extremity strength slight weakness of the left leg 4+/5.  Normal strength on the right.  Sensation symmetrical.  Deep tendon reflexes symmetric.  Plantars downgoing.  Gait not tested.  ASSESSMENT/PLAN [name] is a 85 y.o. female with history of HTN, HLD and AF not on AC presenting [facility] ED with speech difficulty - expressive and receptive. Received tPA 03/04/2019  at 2007. CTA w/ occluded R M2 or M3. Not at IR candidate d/t mild sx.   Stroke:   R MCA scattered infarct 2/2 left M3 occlusion, likely embolic, due to known PAF not on anticoagulation  Code Stroke CT head dense R M2 age indeterminate. No acute abnormality.     CTA head & neck abrupt occlusion R M3 with scant distal irregular flow  MRI right MCA infarcts  2D Echo EF 60-65%  LDL 74  HgbA1c 5.9  lovenox for VTE prophylaxis  aspirin 81 mg daily prior to admission, now on ASA 325mg . Will consider DOAC in 3-5 days for stroke prevention given moderate infarcts   Therapy recommendations:  SNF  Disposition:  SNF  Hx Paroxysmal Atrial Fibrillation  Home  anticoagulation:  none   On metoprolol 25 bid . Per Dr. Marvel Planothbart's note 11/01/2011 pt without documented AF. Previous episode occurred prior to 2008. No specific therapy required. . On ASA now  Given current embolic stroke, will consider DOAC in 3-5 days for stroke prevention given moderate infarcts   Hypertension  Home meds:  Losartan 100, bumex 1  Stable  BP goal < 180/105 . Long-term BP goal normotensive  Hyperlipidemia  Home meds:  welchol 625  Intolerant to multiple statins  LDL 74, goal < 70  Continue welchol  Dysphagia . Secondary to stroke . On dysphagia 1 diet and honey thick liquid . Speech on board . Advance diet as able   AKI on CKD  Cre 1.60->1.44->1.13  Continue IVF @ 40   Resume bumex  Recheck labs am  Encourage po intake  CXR pending  Other Stroke Risk Factors  Advanced age  Morbid Obesity, Body mass index is 40.7 kg/m., recommend weight loss, diet and exercise as appropriate   Obstructive sleep apnea, on CPAP at home  Other Active Problems  Anemia due to CKD, Hb 10.0->10.1  Hospital day # 3  Marvel PlanJindong Johnathyn Viscomi, MD PhD Stroke Neurology 03/07/2019 11:00 AM   To contact Stroke Continuity provider, please refer to [email or web address]. After hours, contact General Neurology

## 2019-03-07 NOTE — TOC Initial Note (Signed)
Transition of Care [facility]) - Initial/Assessment Note    Patient Details  Name: Stefanie [name] MRN: [id number] Date of Birth: 1932-10-03  Transition of Care [facility]) CM/SW Contact:    Gelene Mink, [place] Phone Number: 03/07/2019, 5:11 PM  Clinical Narrative:                  CSW met with the patient and her daughter at bedside. The patient's daughter is Marcie Bal and is also her HCPOA. CSW obtained permission to speak with Marcie Bal in the room. CSW introduced herself and explained her role. CSW shared the therapy recommendation. The patient has been to [facility] inpatient rehab back in 1999 but has not been to a SNF. Both the patient and her daughter are agreeable to rehab. Marcie Bal stated that she would prefer her mother to go to [facility]. She stated she is familiar with the facility and her father has been there in the past. Her second choice would be [facility] then [facility]. CSW explained the SNF process. CSW stated she could send the referrals out and provide bed offers as they became available.   CSW will continue to follow.   Expected Discharge Plan: Skilled Nursing Facility Barriers to Discharge: Insurance Authorization, Continued Medical Work up   Patient Goals and CMS Choice Patient states their goals for this hospitalization and ongoing recovery are:: Pt stated that she needs rehab CMS Medicare.gov Compare Post Acute Care list provided to:: Patient Choice offered to / list presented to : Patient  Expected Discharge Plan and Services Expected Discharge Plan: [place] In-house Referral: Clinical Social Work Discharge Planning Services: NA Post Acute Care Choice: [place] Living arrangements for the past 2 months: Single Family Home                 DME Arranged: N/A DME Agency: NA       HH Arranged: NA [place] Agency: NA        Prior Living Arrangements/Services Living arrangements for the past 2 months: [place] Lives with::  Spouse Patient language and need for interpreter reviewed:: No Do you feel safe going back to the place where you live?: Yes      Need for Family Participation in Patient Care: Yes (Comment) Care giver support system in place?: Yes (comment)   Criminal Activity/Legal Involvement Pertinent to Current Situation/Hospitalization: No - Comment as needed  Activities of Daily Living Home Assistive Devices/Equipment: Wheelchair ADL Screening (condition at time of admission) Patient's cognitive ability adequate to safely complete daily activities?: Yes Is the patient deaf or have difficulty hearing?: No Does the patient have difficulty seeing, even when wearing glasses/contacts?: No Does the patient have difficulty concentrating, remembering, or making decisions?: No Patient able to express need for assistance with ADLs?: Yes Does the patient have difficulty dressing or bathing?: Yes Independently performs ADLs?: No Communication: Independent Dressing (OT): Needs assistance Is this a change from baseline?: Pre-admission baseline Grooming: Needs assistance Is this a change from baseline?: Pre-admission baseline Bathing: Needs assistance Is this a change from baseline?: Pre-admission baseline Toileting: Needs assistance Is this a change from baseline?: Pre-admission baseline In/Out Bed: Dependent Is this a change from baseline?: Pre-admission baseline Walks in Home: Dependent Is this a change from baseline?: Pre-admission baseline Does the patient have difficulty walking or climbing stairs?: Yes Weakness of Legs: Both Weakness of Arms/Hands: Both  Permission Sought/Granted Permission sought to share information with : Facility Sport and exercise psychologist Permission granted to share information with :  Yes, Verbal Permission Granted  Share Information with NAME: Marcie Bal  Permission granted to share info w AGENCY: All SNF  Permission granted to share info w Relationship: Daughter     Emotional  Assessment Appearance:: Appears stated age Attitude/Demeanor/Rapport: Engaged Affect (typically observed): Calm Orientation: : Oriented to Self, Oriented to Place, Oriented to  Time, Oriented to Situation Alcohol / Substance Use: Not Applicable Psych Involvement: No (comment)  Admission diagnosis:  Cerebral infarction, unspecified mechanism [facility]) [I63.9] Patient Active Problem List   Diagnosis Date Noted  . Stroke (cerebrum) ([place]) 03/04/2019  . Sleep apnea   . Diverticular disease   . Paroxysmal atrial fibrillation (HCC)   . Hypertension   . Hyperlipidemia   . Syncope 06/29/2011  . Morbid obesity ([place]) 06/29/2011   PCP:  Celene Squibb, MD Pharmacy:   [place], [place] [address] [place] [address] Phone: [phone] Fax: [place], [place] [address] Phone: [phone] Fax: [phone]     Social Determinants of Health (SDOH) Interventions    Readmission Risk Interventions No flowsheet data found.

## 2019-03-08 DIAGNOSIS — D631 Anemia in chronic kidney disease: Secondary | ICD-10-CM

## 2019-03-08 DIAGNOSIS — I509 Heart failure, unspecified: Secondary | ICD-10-CM

## 2019-03-08 DIAGNOSIS — E870 Hyperosmolality and hypernatremia: Secondary | ICD-10-CM

## 2019-03-08 LAB — BASIC METABOLIC PANEL
Anion gap: 6 (ref 5–15)
BUN: 43 mg/dL — ABNORMAL HIGH (ref 8–23)
CO2: 23 mmol/L (ref 22–32)
Calcium: 9 mg/dL (ref 8.9–10.3)
Chloride: 118 mmol/L — ABNORMAL HIGH (ref 98–111)
Creatinine, Ser: 1.13 mg/dL — ABNORMAL HIGH (ref 0.44–1.00)
GFR calc Af Amer: 52 mL/min — ABNORMAL LOW (ref >60)
GFR calc non Af Amer: 45 mL/min — ABNORMAL LOW (ref >60)
Glucose, Bld: 97 mg/dL (ref 70–99)
Potassium: 4.1 mmol/L (ref 3.5–5.1)
Sodium: 147 mmol/L — ABNORMAL HIGH (ref 135–145)

## 2019-03-08 LAB — CBC
HCT: 31.8 % — ABNORMAL LOW (ref 36.0–46.0)
Hemoglobin: 9.6 g/dL — ABNORMAL LOW (ref 12.0–15.0)
MCH: 29.1 pg (ref 26.0–34.0)
MCHC: 30.2 g/dL (ref 30.0–36.0)
MCV: 96.4 fL (ref 80.0–100.0)
Platelets: 188 10*3/uL (ref 150–400)
RBC: 3.3 MIL/uL — ABNORMAL LOW (ref 3.87–5.11)
RDW: 16 % — ABNORMAL HIGH (ref 11.5–15.5)
WBC: 7.9 10*3/uL (ref 4.0–10.5)
nRBC: 0 % (ref 0.0–0.2)

## 2019-03-08 MED ORDER — METOPROLOL TARTRATE 50 MG PO TABS
50.0000 mg | ORAL_TABLET | Freq: Two times a day (BID) | ORAL | Status: DC
  Administered 2019-03-08 – 2019-03-10 (×5): 50 mg via ORAL
  Filled 2019-03-08 (×5): qty 1

## 2019-03-08 NOTE — Plan of Care (Signed)
Patient respirations within normal limits, when repositioned in bed little exertion experienced at that time.

## 2019-03-08 NOTE — Progress Notes (Signed)
Reassessed patient's respirtory status and patientis currently at baseline, O2 sat is 100%, RR=18, the use of accessory muscles has decreases and no wheezing is heard in the upper or lower lobes. Patient currently resting with eyes closed. 3L of o2 on via [place].

## 2019-03-08 NOTE — Progress Notes (Signed)
CSW called [employer] and started insurance authorization. The patient currently does not have any bed offers.   CSW will continue to follow and assist.   Domenic Schwab, MSW, [place] Worker [facility]  [phone]

## 2019-03-08 NOTE — Progress Notes (Signed)
Patient tolerated eating ice chips throughout the shift well. RN remained at bedside to monitor the patient during consumption.

## 2019-03-08 NOTE — Progress Notes (Signed)
STROKE TEAM PROGRESS NOTE   INTERVAL HISTORY Patient RN at bedside for bath. Pt still has left facial droop and left UE weakness. No neuro changes from yesterday. CXR stable.   Vitals:   03/07/19 1941 03/07/19 2137 03/07/19 2344 03/08/19 0415  BP: (!) 147/49  (!) 149/64 (!) 161/84  Pulse: 91 94 79 88  Resp: 18 (!) 22 15 17   Temp: (!) 97.5 F (36.4 C)  (!) 97.5 F (36.4 C) 98.1 F (36.7 C)  TempSrc: Oral  Oral Oral  SpO2: 100% 99% 97% 94%  Weight:      Height:        CBC:  Recent Labs  Lab 03/04/19 1934  03/07/19 0925 03/08/19 0603  WBC 6.3   < > 6.9 7.9  NEUTROABS 3.8  --   --   --   HGB 11.6*   < > 10.1* 9.6*  HCT 37.1   < > 33.5* 31.8*  MCV 93.0   < > 96.5 96.4  PLT 222   < > 185 188   < > = values in this interval not displayed.    Basic Metabolic Panel:  Recent Labs  Lab 03/07/19 0925 03/08/19 0603  NA 145 147*  K 4.0 4.1  CL 118* 118*  CO2 18* 23  GLUCOSE 95 97  BUN 46* 43*  CREATININE 1.11* 1.13*  CALCIUM 9.1 9.0   Lipid Panel:     Component Value Date/Time   CHOL 137 03/05/2019 0547   TRIG 75 03/05/2019 0547   HDL 48 03/05/2019 0547   CHOLHDL 2.9 03/05/2019 0547   VLDL 15 03/05/2019 0547   LDLCALC 74 03/05/2019 0547   HgbA1c:  Lab Results  Component Value Date   HGBA1C 5.9 (H) 03/05/2019   Urine Drug Screen: No results found for: LABOPIA, COCAINSCRNUR, LABBENZ, AMPHETMU, THCU, LABBARB  Alcohol Level     Component Value Date/Time   ETH <10 03/04/2019 1934    IMAGING Dg Chest Port 1 View  Result Date: 03/07/2019 CLINICAL DATA:  CHF EXAM: PORTABLE CHEST 1 VIEW COMPARISON:  03/04/2019 FINDINGS: Unchanged AP portable examination with small bilateral layering pleural effusions and/or atelectasis. No new airspace opacity. Cardiomegaly. IMPRESSION: Unchanged AP portable examination with small bilateral layering pleural effusions and/or atelectasis. No new airspace opacity. Cardiomegaly. Electronically Signed   By: Eddie Candle M.D.   On:  03/07/2019 13:58    PHYSICAL EXAM Frail elderly Caucasian lady is not in distress. . Afebrile. Head is nontraumatic. Neck is supple without bruit.    Cardiac exam no murmur or gallop. Lungs are clear to auscultation, but prolonged expiration. Distal pulses are well felt.  Neurological Exam : She is awake alert oriented to time place and person.  She has mild dysarthria but can be understood.  She is able to name repeat and comprehend well.  Extraocular movements are full range without nystagmus.  She has left lower facial weakness.  Tongue midline.  Motor system exam LUE drift proximal and left hand grip is weak.  Lower extremity strength slight weakness of the left leg 4+/5.  Normal strength on the right.  Sensation symmetrical.  Deep tendon reflexes symmetric.  Plantars downgoing.  Gait not tested.  ASSESSMENT/PLAN [name] is a 85 y.o. female with history of HTN, HLD and AF not on AC presenting to [facility] ED with speech difficulty - expressive and receptive. Received tPA 03/04/2019 at 2007. CTA w/ occluded R M2 or M3. Not at IR candidate d/t mild  sx.   Stroke:   R MCA scattered infarct 2/2 left M3 occlusion, likely embolic, due to known PAF not on anticoagulation  Code Stroke CT head dense R M2 age indeterminate. No acute abnormality.     CTA head & neck abrupt occlusion R M3 with scant distal irregular flow  MRI right MCA infarcts  2D Echo EF 60-65%  LDL 74  HgbA1c 5.9  lovenox for VTE prophylaxis  aspirin 81 mg daily prior to admission, now on ASA 325mg . Will consider DOAC in 3-5 days for stroke prevention given moderate infarcts   Therapy recommendations:  SNF  Disposition:  SNF  Hx Paroxysmal Atrial Fibrillation  Home anticoagulation:  none   On metoprolol 25 bid -> 50 bid . Per Dr. Marvel Planothbart's note 11/01/2011 pt without documented AF. Previous episode occurred prior to 2008. No specific therapy required. . Rate controlled  . On ASA now  Given current  embolic stroke, will consider DOAC in 3-5 days for stroke prevention given moderate infarcts   Hypertension  Home meds:  Losartan 100, bumex 1, metoprolol 25 bid  Stable on higher end  Increase metoprolol to 50 bid  BP goal < 180/105 . Long-term BP goal normotensive  Hyperlipidemia  Home meds:  welchol 625  Intolerant to multiple statins  LDL 74, goal < 70  Continue welchol  Dysphagia . Secondary to stroke . On dysphagia 1 diet and honey thick liquid . Speech on board . Advance diet as able   AKI on CKD  Cre 1.60->1.44->1.13->1.11  Continue IVF @ 40   Resume bumex  Recheck labs am  Encourage po intake  Other Stroke Risk Factors  Advanced age  Morbid Obesity, Body mass index is 40.7 kg/m., recommend weight loss, diet and exercise as appropriate   Obstructive sleep apnea, on CPAP at home  Other Active Problems  Anemia due to CKD, Hb 10.0->10.1->9.6  Hypernatremia Na 141->145->147 - continue IVF and encourage po intake.   Hospital day # 4  Stefanie PlanJindong Emmily Pellegrin, MD PhD Stroke Neurology 03/08/2019 3:39 PM  To contact Stroke Continuity provider, please refer to [email or web address]. After hours, contact General Neurology

## 2019-03-08 NOTE — SANE Note (Signed)
During bed bath, patient begin to desatas low as 86%, patient sat up in bed and 3L of oxgyen was place on patient via [place]. Patient currently at 100% with a new onset of wheezing heard in upper and lower lobe. A call was placed to RT to inform of patient change in respiratory status. Currently waiting a return call. Will continue to monitor.

## 2019-03-09 ENCOUNTER — Inpatient Hospital Stay (HOSPITAL_COMMUNITY): Payer: PPO

## 2019-03-09 DIAGNOSIS — D649 Anemia, unspecified: Secondary | ICD-10-CM | POA: Diagnosis present

## 2019-03-09 DIAGNOSIS — R0602 Shortness of breath: Secondary | ICD-10-CM

## 2019-03-09 DIAGNOSIS — N189 Chronic kidney disease, unspecified: Secondary | ICD-10-CM | POA: Diagnosis present

## 2019-03-09 DIAGNOSIS — N289 Disorder of kidney and ureter, unspecified: Secondary | ICD-10-CM

## 2019-03-09 DIAGNOSIS — N179 Acute kidney failure, unspecified: Secondary | ICD-10-CM | POA: Diagnosis present

## 2019-03-09 DIAGNOSIS — I69391 Dysphagia following cerebral infarction: Secondary | ICD-10-CM

## 2019-03-09 LAB — BASIC METABOLIC PANEL
Anion gap: 10 (ref 5–15)
BUN: 43 mg/dL — ABNORMAL HIGH (ref 8–23)
CO2: 20 mmol/L — ABNORMAL LOW (ref 22–32)
Calcium: 9 mg/dL (ref 8.9–10.3)
Chloride: 117 mmol/L — ABNORMAL HIGH (ref 98–111)
Creatinine, Ser: 1.29 mg/dL — ABNORMAL HIGH (ref 0.44–1.00)
GFR calc Af Amer: 44 mL/min — ABNORMAL LOW (ref >60)
GFR calc non Af Amer: 38 mL/min — ABNORMAL LOW (ref >60)
Glucose, Bld: 110 mg/dL — ABNORMAL HIGH (ref 70–99)
Potassium: 4.2 mmol/L (ref 3.5–5.1)
Sodium: 147 mmol/L — ABNORMAL HIGH (ref 135–145)

## 2019-03-09 LAB — CBC
HCT: 33.5 % — ABNORMAL LOW (ref 36.0–46.0)
Hemoglobin: 10.1 g/dL — ABNORMAL LOW (ref 12.0–15.0)
MCH: 29.4 pg (ref 26.0–34.0)
MCHC: 30.1 g/dL (ref 30.0–36.0)
MCV: 97.4 fL (ref 80.0–100.0)
Platelets: 187 10*3/uL (ref 150–400)
RBC: 3.44 MIL/uL — ABNORMAL LOW (ref 3.87–5.11)
RDW: 16.1 % — ABNORMAL HIGH (ref 11.5–15.5)
WBC: 8.8 10*3/uL (ref 4.0–10.5)
nRBC: 0 % (ref 0.0–0.2)

## 2019-03-09 MED ORDER — APIXABAN 5 MG PO TABS
5.0000 mg | ORAL_TABLET | Freq: Two times a day (BID) | ORAL | Status: DC
  Administered 2019-03-09 – 2019-03-10 (×2): 5 mg via ORAL
  Filled 2019-03-09 (×2): qty 1

## 2019-03-09 MED ORDER — IPRATROPIUM-ALBUTEROL 0.5-2.5 (3) MG/3ML IN SOLN
3.0000 mL | Freq: Four times a day (QID) | RESPIRATORY_TRACT | Status: DC | PRN
  Administered 2019-03-09 (×2): 3 mL via RESPIRATORY_TRACT
  Filled 2019-03-09 (×3): qty 3

## 2019-03-09 MED ORDER — ONDANSETRON HCL 4 MG/2ML IJ SOLN
4.0000 mg | Freq: Four times a day (QID) | INTRAMUSCULAR | Status: DC | PRN
  Administered 2019-03-09: 4 mg via INTRAVENOUS
  Filled 2019-03-09: qty 2

## 2019-03-09 NOTE — Progress Notes (Signed)
PT Cancellation Note  Patient Details Name: Stefanie [name] MRN: [id number] DOB: 05/02/1933   Cancelled Treatment:     Pt more SOB today.  Nursing and respiratory with her now.  I will put her on for PT session tomorrow when feeling better.   Loyal Buba 03/09/2019, 9:51 AM

## 2019-03-09 NOTE — Plan of Care (Signed)
  Problem: Nutrition: Goal: Adequate nutrition will be maintained Outcome: Progressing   

## 2019-03-09 NOTE — Progress Notes (Signed)
STROKE TEAM PROGRESS NOTE   INTERVAL HISTORY Pt daughter at bedside. Pt this am has SOB, CXR no change, put on neb treatment, much improved after. Pending SNF  Vitals:   03/09/19 0500 03/09/19 0600 03/09/19 0700 03/09/19 0821  BP:   (!) 152/87 (!) 157/87  Pulse: 78 72 82 86  Resp:  15 19   Temp:   98.3 F (36.8 C)   TempSrc:   Axillary   SpO2: 100% 96% 100%   Weight:      Height:        CBC:  Recent Labs  Lab 03/04/19 1934  03/08/19 0603 03/09/19 0421  WBC 6.3   < > 7.9 8.8  NEUTROABS 3.8  --   --   --   HGB 11.6*   < > 9.6* 10.1*  HCT 37.1   < > 31.8* 33.5*  MCV 93.0   < > 96.4 97.4  PLT 222   < > 188 187   < > = values in this interval not displayed.    Basic Metabolic Panel:  Recent Labs  Lab 03/08/19 0603 03/09/19 0421  NA 147* 147*  K 4.1 4.2  CL 118* 117*  CO2 23 20*  GLUCOSE 97 110*  BUN 43* 43*  CREATININE 1.13* 1.29*  CALCIUM 9.0 9.0   Lipid Panel:     Component Value Date/Time   CHOL 137 03/05/2019 0547   TRIG 75 03/05/2019 0547   HDL 48 03/05/2019 0547   CHOLHDL 2.9 03/05/2019 0547   VLDL 15 03/05/2019 0547   LDLCALC 74 03/05/2019 0547   HgbA1c:  Lab Results  Component Value Date   HGBA1C 5.9 (H) 03/05/2019   Urine Drug Screen: No results found for: LABOPIA, COCAINSCRNUR, LABBENZ, AMPHETMU, THCU, LABBARB  Alcohol Level     Component Value Date/Time   ETH <10 03/04/2019 1934    IMAGING No results found.  PHYSICAL EXAM   Frail elderly Caucasian lady is not in distress. . Afebrile. Head is nontraumatic. Neck is supple without bruit.    Cardiac exam no murmur or gallop. Lungs are clear to auscultation, improved expiratory wheezing. Distal pulses are well felt.  Neurological Exam : She is awake alert oriented to time place and person.  She has mild dysarthria but can be understood.  She is able to name repeat and comprehend well.  Extraocular movements are full range without nystagmus.  She has left lower facial weakness.  Tongue  midline.  Motor system exam LUE drift proximal and left hand grip is weak.  Lower extremity strength slight weakness of the left leg 4+/5.  Normal strength on the right.  Sensation symmetrical.  Deep tendon reflexes symmetric.  Plantars downgoing.  Gait not tested.   ASSESSMENT/PLAN Stefanie [name] is a 85 y.o. female with history of HTN, HLD and AF not on AC presenting to [facility] ED with speech difficulty - expressive and receptive. Received tPA 03/04/2019 at 2007. CTA w/ occluded R M2 or M3. Not at IR candidate d/t mild sx.   Stroke:   R MCA scattered infarct 2/2 left M3 occlusion, likely embolic, due to known PAF not on anticoagulation  Code Stroke CT head dense R M2 age indeterminate. No acute abnormality.     CTA head & neck abrupt occlusion R M3 with scant distal irregular flow  MRI right MCA infarcts  2D Echo EF 60-65%  LDL 74  HgbA1c 5.9  lovenox for VTE prophylaxis  aspirin 81 mg daily prior  to admission, now on ASA 325mg . Will start eliquis today for stroke prevention. ASA will be discontinued.   Therapy recommendations:  SNF  Disposition:  SNF - have insurance approval. Have contacted SW for bed assignment  Hx Paroxysmal Atrial Fibrillation  Home anticoagulation:  none   On metoprolol 25 bid -> 50 bid . Per Dr. Marvel Planothbart's note 11/01/2011 pt without documented AF. Previous episode occurred prior to 2008. No specific therapy required. . Rate controlled   On ASA now, will start eliquis tonight  Hypertension  Home meds:  Losartan 100, bumex 1, metoprolol 25 bid  Stable now  Continue metoprolol 50 bid  BP goal < 180/105 . Long-term BP goal normotensive  Hyperlipidemia  Home meds:  welchol 625  Intolerant to multiple statins  LDL 74, goal < 70  Continue welchol  Dysphagia . Secondary to stroke . On dysphagia 1 diet and honey thick liquid . Speech on board . Advance diet as able   AKI on CKD  Cre 1.60->1.44->1.13->1.11->1.13->1.29  Continue  IVF @ 40   On Bumex  Encourage po intake  Other Stroke Risk Factors  Advanced age  Morbid Obesity, Body mass index is 40.7 kg/m., recommend weight loss, diet and exercise as appropriate   Obstructive sleep apnea, on CPAP at home  Other Active Problems  Anemia due to CKD, Hb 10.0->10.1->9.6->10.1  Hypernatremia Na 141->145->147 - continue IVF and encourage po intak - 147  SOB, desat - on duoneb. CXR no change ->much improved.   Hospital day # 5  Marvel PlanJindong Krishauna Schatzman, MD PhD Stroke Neurology 03/09/2019 12:23 PM  To contact Stroke Continuity provider, please refer to [email or web address]. After hours, contact General Neurology

## 2019-03-09 NOTE — Progress Notes (Signed)
CSW has received insurance authorization. [employer] has approved the patient for 7 days. Insurance authorization number is [id number].   CSW will continue to follow and assist with disposition.   Domenic Schwab, MSW, [place] Worker [facility]  [phone]

## 2019-03-09 NOTE — Progress Notes (Signed)
Pt's oxygen dropped down to 84% percent.  PT reported feeling short of breath. RN turned oxygen up to 5L and oxygen went up to 98%. MD notified, and MD ordered breathing treatment and X-ray. PT reported feeling better after breathing treatment administration. Will continue to monitor.

## 2019-03-09 NOTE — Progress Notes (Signed)
  Speech Language Pathology Treatment: Dysphagia  Patient Details Name: Stefanie [name] MRN: [id number] DOB: 03-Mar-1933 Today's Date: 03/09/2019 Time: 4193-7902 SLP Time Calculation (min) (ACUTE ONLY): 30 min  Assessment / Plan / Recommendation Clinical Impression  Followed up to assess progress on Dys1 / honey thick diet. Pt and family reported no issues with puree consistency foods. Pt reported increased sensation on her L cheek since eval, although not back to baseline. She recalled her compensatory strategy of chewing on her right side.  Given honey thick applejuice, she self-fed and showed no signs of aspiration, anterior spillage, or pocketing. Mixed consistency graham cracker and pudding resulted in some L side pocketing, but no signs of aspiration. The pt was educated on lingual sweeping and oral care after eating to remove residue. Graham cracker and saltine trials were also WNL with the exception of residue and taking small piecemeal bites. Recommend moving up to D2 foods and maintaining honey thick liquids and continuing education on R side chewing and sweeping after eating. Pt allowed to have minimal ice chips with full supervision 2-3x a shift after oral care, this has been discussed with the pt and family.    HPI HPI: 85 y.o. female admitted for R CVA, presenting with suspected dysarthria and aphasia. On 8/19 she began having difficulty with understanding as well as with finding words. Reported mild SOB. On 8/20 13:12, she was alert and oriented, able to give a clear and coherent history, able to name simple objects and repeat without difficulty, and did not have evidence of neglect. Left facial weakness and mild strengh weakness in left arm. Nursing reports facial droop has worsened. She has a history of hypertension, hyperlipidemia, atrial fibrillation not on anticoagulation. Barium esophagram 2015 with Diffuse age-related impairment of esophageal motility, Mild laryngeal penetration without  aspiration, GERD.       SLP Plan  Continue with current plan of care       Recommendations  Diet recommendations: Dysphagia 2 (fine chop);Honey-thick liquid Liquids provided via: Cup Medication Administration: Crushed with puree Supervision: Staff to assist with self feeding Compensations: Lingual sweep for clearance of pocketing;Other (Comment)(Use sponge toothbrush to clear residue on L side)                Oral Care Recommendations: Oral care BID Follow up Recommendations: Skilled Nursing facility SLP Visit Diagnosis: Dysphagia, oral phase (R13.11) Plan: Continue with current plan of care                       Stefanie [name] 03/09/2019, 2:55 PM

## 2019-03-09 NOTE — Progress Notes (Signed)
Late Entry- Speech Pathology- Bedside Swallow Assessment    03/05/19 1019  SLP Visit Information  SLP Received On 03/05/19  General Information  Type of Study Bedside Swallow Evaluation  Previous Swallow Assessment  (none)  Diet Prior to this Study NPO  Temperature Spikes Noted No  Respiratory Status Room air  History of Recent Intubation No  Behavior/Cognition Cooperative;Alert  Oral Cavity Assessment WFL  Oral Care Completed by SLP Yes  Oral Cavity - Dentition Missing dentition;Poor condition (oral decay radiating from upper left tooth- tooth horizontal)  Vision Functional for self-feeding  Self-Feeding Abilities Needs assist  Patient Positioning Upright in bed  Baseline Vocal Quality Normal  Volitional Cough Weak  Pain Assessment  Pain Assessment Faces  Faces Pain Scale 8  Pain Location Left hip  Pain Descriptors / Indicators Grimacing;Moaning  Pain Intervention(s) Repositioned  Oral Assessment (Complete on admission/transfer/change in patient condition)  Does patient have any of the following "high(er) risk" factors? Nutritional status - fluids only or NPO for >24 hours  Patient is LOW RISK Follow universal precautions (see row information)  Oral Motor/Sensory Function  Overall Oral Motor/Sensory Function Moderate impairment  Facial ROM Reduced left;Suspected CN VII (facial) dysfunction  Facial Symmetry Abnormal symmetry left;Suspected CN VII (facial) dysfunction  Facial Strength Reduced left;Suspected CN VII (facial) dysfunction  Facial Sensation Reduced left;Suspected CN V (Trigeminal) dysfunction  Lingual ROM WFL  Lingual Symmetry WFL  Velum WFL  Mandible WFL  Ice Chips  Ice chips NT  Thin Liquid  Thin Liquid Impaired  Presentation Cup  Oral Phase Impairments Reduced labial seal  Oral Phase Functional Implications Left anterior spillage  Pharyngeal  Phase Impairments Cough - Immediate  Nectar Thick Liquid  Nectar Thick Liquid NT  Honey Thick Liquid  Honey  Thick Liquid NT  Puree  Puree NT  Solid  Solid NT  SLP - End of Session  Patient left in bed;with bed alarm set  Nurse Communication Plan for instrumental testing;Diet recommendation  SLP Assessment  Clinical Impression Statement (ACUTE ONLY) Client presented with left facial hemiparalysis with no sensation in left forehead or cheek. Given the [place], she had extensive left side antierior spillage and poor awareness of the bolus. When drinking she reflexively produced a weak cough with a wet vocal quality, indicitive of possible aspiration, NPO recommended until MBS is completed.  SLP Visit Diagnosis Dysphagia, oral phase (R13.11)  Impact on safety and function Severe aspiration risk;Risk for inadequate nutrition/hydration  Other Related Risk Factors History of GERD;History of dysphagia  Swallow Evaluation Recommendations  SLP Diet Recommendations NPO except meds  Treatment Plan  Oral Care Recommendations Oral care QID  Treatment Recommendations Defer until completion of intrumental exam  Individuals Consulted  Consulted and Agree with Results and Recommendations Patient;RN  SLP Time Calculation  SLP Start Time (ACUTE ONLY) 0905  SLP Stop Time (ACUTE ONLY) 0935  SLP Time Calculation (min) (ACUTE ONLY) 30 min  SLP Evaluations  $ SLP Speech Visit 1 Visit  SLP Evaluations  $BSS Swallow 1 Procedure    Orbie Pyo Jayvian Escoe M.Ed Risk analyst [phone] Office [phone]

## 2019-03-10 ENCOUNTER — Inpatient Hospital Stay (HOSPITAL_COMMUNITY): Payer: PPO

## 2019-03-10 ENCOUNTER — Inpatient Hospital Stay
Admission: RE | Admit: 2019-03-10 | Discharge: 2019-03-12 | Disposition: A | Discharge status: Nursing Home (Includes ICF) | Payer: PPO | Source: Ambulatory Visit | Attending: Internal Medicine | Admitting: Internal Medicine

## 2019-03-10 DIAGNOSIS — I130 Hypertensive heart and chronic kidney disease with heart failure and stage 1 through stage 4 chronic kidney disease, or unspecified chronic kidney disease: Secondary | ICD-10-CM | POA: Diagnosis not present

## 2019-03-10 DIAGNOSIS — J45909 Unspecified asthma, uncomplicated: Secondary | ICD-10-CM | POA: Diagnosis not present

## 2019-03-10 DIAGNOSIS — Z741 Need for assistance with personal care: Secondary | ICD-10-CM | POA: Diagnosis not present

## 2019-03-10 DIAGNOSIS — E870 Hyperosmolality and hypernatremia: Secondary | ICD-10-CM | POA: Diagnosis not present

## 2019-03-10 DIAGNOSIS — J9601 Acute respiratory failure with hypoxia: Secondary | ICD-10-CM | POA: Diagnosis not present

## 2019-03-10 DIAGNOSIS — R4020 Unspecified coma: Secondary | ICD-10-CM | POA: Diagnosis not present

## 2019-03-10 DIAGNOSIS — R52 Pain, unspecified: Secondary | ICD-10-CM | POA: Diagnosis not present

## 2019-03-10 DIAGNOSIS — E782 Mixed hyperlipidemia: Secondary | ICD-10-CM | POA: Diagnosis not present

## 2019-03-10 DIAGNOSIS — M6281 Muscle weakness (generalized): Secondary | ICD-10-CM | POA: Diagnosis not present

## 2019-03-10 DIAGNOSIS — G8929 Other chronic pain: Secondary | ICD-10-CM | POA: Diagnosis not present

## 2019-03-10 DIAGNOSIS — M1A9XX0 Chronic gout, unspecified, without tophus (tophi): Secondary | ICD-10-CM | POA: Diagnosis not present

## 2019-03-10 DIAGNOSIS — R1312 Dysphagia, oropharyngeal phase: Secondary | ICD-10-CM | POA: Diagnosis not present

## 2019-03-10 DIAGNOSIS — I69391 Dysphagia following cerebral infarction: Secondary | ICD-10-CM | POA: Diagnosis not present

## 2019-03-10 DIAGNOSIS — E669 Obesity, unspecified: Secondary | ICD-10-CM | POA: Diagnosis not present

## 2019-03-10 DIAGNOSIS — M2550 Pain in unspecified joint: Secondary | ICD-10-CM | POA: Diagnosis not present

## 2019-03-10 DIAGNOSIS — Z978 Presence of other specified devices: Secondary | ICD-10-CM | POA: Diagnosis not present

## 2019-03-10 DIAGNOSIS — R0602 Shortness of breath: Secondary | ICD-10-CM | POA: Diagnosis not present

## 2019-03-10 DIAGNOSIS — Z993 Dependence on wheelchair: Secondary | ICD-10-CM | POA: Diagnosis not present

## 2019-03-10 DIAGNOSIS — N179 Acute kidney failure, unspecified: Secondary | ICD-10-CM | POA: Diagnosis not present

## 2019-03-10 DIAGNOSIS — R279 Unspecified lack of coordination: Secondary | ICD-10-CM | POA: Diagnosis not present

## 2019-03-10 DIAGNOSIS — Z9049 Acquired absence of other specified parts of digestive tract: Secondary | ICD-10-CM | POA: Diagnosis not present

## 2019-03-10 DIAGNOSIS — I509 Heart failure, unspecified: Secondary | ICD-10-CM | POA: Diagnosis not present

## 2019-03-10 DIAGNOSIS — K449 Diaphragmatic hernia without obstruction or gangrene: Secondary | ICD-10-CM | POA: Diagnosis not present

## 2019-03-10 DIAGNOSIS — I480 Paroxysmal atrial fibrillation: Secondary | ICD-10-CM | POA: Diagnosis not present

## 2019-03-10 DIAGNOSIS — Z20828 Contact with and (suspected) exposure to other viral communicable diseases: Secondary | ICD-10-CM | POA: Diagnosis not present

## 2019-03-10 DIAGNOSIS — Z973 Presence of spectacles and contact lenses: Secondary | ICD-10-CM | POA: Diagnosis not present

## 2019-03-10 DIAGNOSIS — N3010 Interstitial cystitis (chronic) without hematuria: Secondary | ICD-10-CM | POA: Diagnosis not present

## 2019-03-10 DIAGNOSIS — G4730 Sleep apnea, unspecified: Secondary | ICD-10-CM | POA: Diagnosis not present

## 2019-03-10 DIAGNOSIS — R1310 Dysphagia, unspecified: Secondary | ICD-10-CM | POA: Diagnosis not present

## 2019-03-10 DIAGNOSIS — I5033 Acute on chronic diastolic (congestive) heart failure: Secondary | ICD-10-CM | POA: Diagnosis not present

## 2019-03-10 DIAGNOSIS — G4733 Obstructive sleep apnea (adult) (pediatric): Secondary | ICD-10-CM | POA: Diagnosis not present

## 2019-03-10 DIAGNOSIS — K219 Gastro-esophageal reflux disease without esophagitis: Secondary | ICD-10-CM | POA: Diagnosis not present

## 2019-03-10 DIAGNOSIS — R471 Dysarthria and anarthria: Secondary | ICD-10-CM | POA: Diagnosis not present

## 2019-03-10 DIAGNOSIS — I959 Hypotension, unspecified: Secondary | ICD-10-CM | POA: Diagnosis not present

## 2019-03-10 DIAGNOSIS — I63411 Cerebral infarction due to embolism of right middle cerebral artery: Secondary | ICD-10-CM | POA: Diagnosis not present

## 2019-03-10 DIAGNOSIS — N183 Chronic kidney disease, stage 3 (moderate): Secondary | ICD-10-CM | POA: Diagnosis not present

## 2019-03-10 DIAGNOSIS — M25551 Pain in right hip: Secondary | ICD-10-CM | POA: Diagnosis not present

## 2019-03-10 DIAGNOSIS — Z515 Encounter for palliative care: Secondary | ICD-10-CM | POA: Diagnosis not present

## 2019-03-10 DIAGNOSIS — I1310 Hypertensive heart and chronic kidney disease without heart failure, with stage 1 through stage 4 chronic kidney disease, or unspecified chronic kidney disease: Secondary | ICD-10-CM | POA: Diagnosis not present

## 2019-03-10 DIAGNOSIS — Z9981 Dependence on supplemental oxygen: Secondary | ICD-10-CM | POA: Diagnosis not present

## 2019-03-10 DIAGNOSIS — R404 Transient alteration of awareness: Secondary | ICD-10-CM | POA: Diagnosis not present

## 2019-03-10 DIAGNOSIS — R0902 Hypoxemia: Secondary | ICD-10-CM | POA: Diagnosis not present

## 2019-03-10 DIAGNOSIS — M810 Age-related osteoporosis without current pathological fracture: Secondary | ICD-10-CM | POA: Diagnosis not present

## 2019-03-10 DIAGNOSIS — M545 Low back pain: Secondary | ICD-10-CM | POA: Diagnosis not present

## 2019-03-10 DIAGNOSIS — Z6829 Body mass index (BMI) 29.0-29.9, adult: Secondary | ICD-10-CM | POA: Diagnosis not present

## 2019-03-10 DIAGNOSIS — I5031 Acute diastolic (congestive) heart failure: Secondary | ICD-10-CM | POA: Diagnosis not present

## 2019-03-10 DIAGNOSIS — R4182 Altered mental status, unspecified: Secondary | ICD-10-CM | POA: Diagnosis not present

## 2019-03-10 DIAGNOSIS — Z66 Do not resuscitate: Secondary | ICD-10-CM | POA: Diagnosis not present

## 2019-03-10 DIAGNOSIS — G6289 Other specified polyneuropathies: Secondary | ICD-10-CM | POA: Diagnosis not present

## 2019-03-10 DIAGNOSIS — I5032 Chronic diastolic (congestive) heart failure: Secondary | ICD-10-CM | POA: Diagnosis not present

## 2019-03-10 DIAGNOSIS — R0600 Dyspnea, unspecified: Secondary | ICD-10-CM | POA: Diagnosis present

## 2019-03-10 DIAGNOSIS — Z7401 Bed confinement status: Secondary | ICD-10-CM | POA: Diagnosis not present

## 2019-03-10 DIAGNOSIS — M1A30X0 Chronic gout due to renal impairment, unspecified site, without tophus (tophi): Secondary | ICD-10-CM | POA: Diagnosis not present

## 2019-03-10 DIAGNOSIS — E785 Hyperlipidemia, unspecified: Secondary | ICD-10-CM | POA: Diagnosis not present

## 2019-03-10 DIAGNOSIS — I10 Essential (primary) hypertension: Secondary | ICD-10-CM | POA: Diagnosis not present

## 2019-03-10 DIAGNOSIS — R55 Syncope and collapse: Secondary | ICD-10-CM | POA: Diagnosis not present

## 2019-03-10 DIAGNOSIS — R29810 Facial weakness: Secondary | ICD-10-CM | POA: Diagnosis not present

## 2019-03-10 LAB — BASIC METABOLIC PANEL
Anion gap: 6 (ref 5–15)
BUN: 41 mg/dL — ABNORMAL HIGH (ref 8–23)
CO2: 22 mmol/L (ref 22–32)
Calcium: 8.5 mg/dL — ABNORMAL LOW (ref 8.9–10.3)
Chloride: 120 mmol/L — ABNORMAL HIGH (ref 98–111)
Creatinine, Ser: 1.32 mg/dL — ABNORMAL HIGH (ref 0.44–1.00)
GFR calc Af Amer: 43 mL/min — ABNORMAL LOW (ref >60)
GFR calc non Af Amer: 37 mL/min — ABNORMAL LOW (ref >60)
Glucose, Bld: 96 mg/dL (ref 70–99)
Potassium: 3.9 mmol/L (ref 3.5–5.1)
Sodium: 148 mmol/L — ABNORMAL HIGH (ref 135–145)

## 2019-03-10 LAB — CBC
HCT: 32.7 % — ABNORMAL LOW (ref 36.0–46.0)
Hemoglobin: 9.8 g/dL — ABNORMAL LOW (ref 12.0–15.0)
MCH: 29.3 pg (ref 26.0–34.0)
MCHC: 30 g/dL (ref 30.0–36.0)
MCV: 97.9 fL (ref 80.0–100.0)
Platelets: 173 10*3/uL (ref 150–400)
RBC: 3.34 MIL/uL — ABNORMAL LOW (ref 3.87–5.11)
RDW: 16.3 % — ABNORMAL HIGH (ref 11.5–15.5)
WBC: 7.1 10*3/uL (ref 4.0–10.5)
nRBC: 0 % (ref 0.0–0.2)

## 2019-03-10 MED ORDER — RESOURCE THICKENUP CLEAR PO POWD: 1.0000 | ORAL | Status: AC | PRN

## 2019-03-10 MED ORDER — APIXABAN 5 MG PO TABS: 5.0000 mg | ORAL_TABLET | Freq: Two times a day (BID) | ORAL | Status: AC

## 2019-03-10 MED ORDER — COLCHICINE 0.6 MG PO TABS: 0.6000 mg | ORAL_TABLET | Freq: Two times a day (BID) | ORAL | Status: AC

## 2019-03-10 MED ORDER — VITAMIN D3 25 MCG PO TABS: 1000.0000 [IU] | ORAL_TABLET | Freq: Every day | ORAL | Status: AC

## 2019-03-10 MED ORDER — METOPROLOL TARTRATE 50 MG PO TABS: 50.0000 mg | ORAL_TABLET | Freq: Two times a day (BID) | ORAL | Status: AC

## 2019-03-10 MED ORDER — GABAPENTIN 100 MG PO CAPS: 100.0000 mg | ORAL_CAPSULE | Freq: Every day | ORAL | Status: AC

## 2019-03-10 NOTE — Care Management Important Message (Signed)
Important Message  Patient Details  Name: Stefanie [name] MRN: [id number] Date of Birth: 1933/01/16   Medicare Important Message Given:  Yes     Devante Capano 03/10/2019, 2:17 PM
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
## 2019-03-10 NOTE — Progress Notes (Signed)
  Speech Language Pathology  Patient Details Name: Stefanie [name] MRN: [id number] DOB: 1932-11-11 Today's Date: 03/10/2019 Time:  -          MBS will be repeated today at 1300.      Orbie Pyo [place].Ed Risk analyst [phone] Office [phone]

## 2019-03-10 NOTE — Plan of Care (Signed)
  Problem: Safety: Goal: Ability to remain free from injury will improve Outcome: Progressing   

## 2019-03-10 NOTE — Progress Notes (Signed)
Modified Barium Swallow Progress Note  Patient Details  Name: Stefanie [name] MRN: [id number] Date of Birth: [birth year]  Today's Date: 03/10/2019  Modified Barium Swallow completed.  Full report located under Chart Review in the Imaging Section.  Brief recommendations include the following:  Clinical Impression  Pt's swallow function has improved mildy from prior study. Increased oral control and transit without significant left buccal pocketing with continued left facial leakage. Initiation of swallow was mostly timely. Thin barium penetrated to cords and silently aspirated during the swallow. Due to obstruction of shoulder could not view swallow with chin tuck. Suspect airway intrusion as pt produced reflexive throat clear x 2. Pt able to upgrade to nectar consistency and recommend continue Dys 2 with further management at SNF for texture upgrade. Pt being discharged today to SNF. Recommend return for repeat MBS in 4 weeks for potential improvement.      Swallow Evaluation Recommendations       SLP Diet Recommendations: Nectar thick liquid;Dysphagia 2 (Fine chop) solids   Liquid Administration via: Cup   Medication Administration: Crushed with puree   Supervision: Staff to assist with self feeding;Full supervision/cueing for compensatory strategies   Compensations: Lingual sweep for clearance of pocketing;Other (Comment)   Postural Changes: Seated upright at 90 degrees   Oral Care Recommendations: Oral care BID        Houston Siren 03/10/2019,5:32 PM   Orbie Pyo [place].Ed Risk analyst [phone] Office [phone]

## 2019-03-10 NOTE — TOC Transition Note (Signed)
Transition of Care [facility]) - CM/SW Discharge Note   Patient Details  Name: Stefanie [name] MRN: [id number] Date of Birth: Jan 27, 1933  [facility] CM/SW Contact:  Geralynn Ochs, LCSW Phone Number: 03/10/2019, 4:46 PM   Clinical Narrative:   Nurse to call report to [phone], Room 158    Final next level of care: Skilled Nursing Facility Barriers to Discharge: Barriers Resolved   Patient Goals and CMS Choice Patient states their goals for this hospitalization and ongoing recovery are:: Pt stated that she needs rehab CMS Medicare.gov Compare Post Acute Care list provided to:: Patient Choice offered to / list presented to : Patient  Discharge Placement              Patient chooses bed at: [facility] Patient to be transferred to facility by: [place] Name of family member notified: Daughter Patient and family notified of of transfer: 03/10/19  Discharge Plan and Services In-house Referral: Clinical Social Work Discharge Planning Services: NA Post Acute Care Choice: [place]          DME Arranged: N/A DME Agency: NA       HH Arranged: NA HH Agency: NA        Social Determinants of Health (SDOH) Interventions     Readmission Risk Interventions No flowsheet data found.

## 2019-03-11 ENCOUNTER — Non-Acute Institutional Stay (SKILLED_NURSING_FACILITY): Payer: PPO | Admitting: Adult Health

## 2019-03-11 ENCOUNTER — Encounter: Payer: Self-pay | Admitting: Adult Health

## 2019-03-11 DIAGNOSIS — I5031 Acute diastolic (congestive) heart failure: Secondary | ICD-10-CM | POA: Diagnosis not present

## 2019-03-11 DIAGNOSIS — K219 Gastro-esophageal reflux disease without esophagitis: Secondary | ICD-10-CM

## 2019-03-11 DIAGNOSIS — E782 Mixed hyperlipidemia: Secondary | ICD-10-CM

## 2019-03-11 DIAGNOSIS — I69391 Dysphagia following cerebral infarction: Secondary | ICD-10-CM

## 2019-03-11 DIAGNOSIS — I480 Paroxysmal atrial fibrillation: Secondary | ICD-10-CM | POA: Diagnosis not present

## 2019-03-11 DIAGNOSIS — G6289 Other specified polyneuropathies: Secondary | ICD-10-CM | POA: Diagnosis not present

## 2019-03-11 DIAGNOSIS — I63411 Cerebral infarction due to embolism of right middle cerebral artery: Secondary | ICD-10-CM | POA: Diagnosis not present

## 2019-03-11 DIAGNOSIS — G4733 Obstructive sleep apnea (adult) (pediatric): Secondary | ICD-10-CM

## 2019-03-11 DIAGNOSIS — M1A30X0 Chronic gout due to renal impairment, unspecified site, without tophus (tophi): Secondary | ICD-10-CM

## 2019-03-11 NOTE — Progress Notes (Signed)
Location:    [facility] Room Number: 158/P Place of Service:  SNF (31)   CODE STATUS: Full Code  Allergies  Allergen Reactions  . Ace Inhibitors Cough    Occurred with Monopril  . Ciprofloxacin     Sick  . Hydrocodone-Acetaminophen Nausea Only  . Lipitor [Atorvastatin]     Tired, weak muscles  . Monopril [Fosinopril Sodium]     Cough   . Morphine And Related   . Nitrofurantoin Monohyd Macro Nausea Only    Also skin rash, edema   . Percocet [Oxycodone-Acetaminophen] Nausea Only  . Statins Other (See Comments)    Myalgias; prior exposure to pravastatin, atorvastatin, lovastatin, Lescol, simvastatin and ezetimibe  . Flagyl [Metronidazole] Nausea Only  . Phenazopyridine Rash    Chief Complaint  Patient presents with  . Hospitalization Follow-up    Hospitalization Follow Up    HPI:  She is a 85 year old woman who has been hospitalized for an acute cva. She is here for short term rehab. More than likely this does represent a long term placement for her. She did say that she hurts all over; and is nauseated. She is using accessor muscles for breathing. She will continue to be followed for her chronic illnesses including: chf; afib; dysphagia   Past Medical History:  Diagnosis Date  . Asthma   . Breast mass    Left  . Chronic kidney disease    Borderline with serum creatinine at the upper limit of normal in 10/2011  . Chronic low back pain   . Diverticular disease    acute diverticulitis in 02/2010  . Dyspnea on exertion 2010  . Gastroesophageal reflux disease    hiatal hernia  . Hyperlipidemia    Lipid profile 10/2011:240, 132, 52, 162  . Hypertension    Lab  10/2011: Normal CMet except GFR of 50, normal microalbumin, normal CBC, uric acid of 7.9, A1c of 6.1, normal urinalysis, low vitamin D; negative stress nuclear study in 2008  . Obesity   . Osteoporosis   . Paroxysmal atrial fibrillation (HCC)   . Sleep apnea 2010   Severe  . Syncope  06/2011  . Urinary incontinence    Interstitial cystitis    Past Surgical History:  Procedure Laterality Date  . CATARACT EXTRACTION    . CHOLECYSTECTOMY  1993  . CHOLECYSTECTOMY  1993  . COLONOSCOPY  2008   Negative screening study  . DILATION AND CURETTAGE OF UTERUS  1986  . LUMBAR LAMINECTOMY  1992  . ORTHOPEDIC SURGERY    . TUBAL LIGATION      Social History   Socioeconomic History  . Marital status: Married    Spouse name: Not on file  . Number of children: Not on file  . Years of education: Not on file  . Highest education level: Not on file  Occupational History  . Not on file  Social Needs  . Financial resource strain: Not on file  . Food insecurity    Worry: Not on file    Inability: Not on file  . Transportation needs    Medical: Not on file    Non-medical: Not on file  Tobacco Use  . Smoking status: Never Smoker  . Smokeless tobacco: Never Used  Substance and Sexual Activity  . Alcohol use: No    Alcohol/week: 0.0 standard drinks  . Drug use: No  . Sexual activity: Not on file  Lifestyle  . Physical activity    Days  per week: Not on file    Minutes per session: Not on file  . Stress: Not on file  Relationships  . Social Musician on phone: Not on file    Gets together: Not on file    Attends religious service: Not on file    Active member of club or organization: Not on file    Attends meetings of clubs or organizations: Not on file    Relationship status: Not on file  . Intimate partner violence    Fear of current or ex partner: Not on file    Emotionally abused: Not on file    Physically abused: Not on file    Forced sexual activity: Not on file  Other Topics Concern  . Not on file  Social History Narrative   Married   Exercises regularly   Family History  Problem Relation Age of Onset  . Heart attack Mother   . Asthma Father   . Diabetes Father   . Heart failure Father   . Colitis Sister   . Heart failure Brother   .  Pancreatic cancer Brother   . Diabetes Sister   . Transient ischemic attack Sister       VITAL SIGNS BP (!) 101/52   Pulse 97   Temp (!) 96.6 F (35.9 C) (Oral)   Resp 20   Wt 190 lb 6.4 oz (86.4 kg)   BMI 34.82 kg/m   Outpatient Encounter Medications as of 03/11/2019  Medication Sig  . allopurinol (ZYLOPRIM) 100 MG tablet Take 100 mg by mouth 2 (two) times daily.  Marland Kitchen apixaban (ELIQUIS) 5 MG TABS tablet Take 1 tablet (5 mg total) by mouth 2 (two) times daily.  Lucilla Lame [place]-Castor Oil (VENELEX) OINT Apply to bilateral buttocks and sacrum qshift & prn for prevention. Every shift  . bumetanide (BUMEX) 1 MG tablet Take 1 mg by mouth daily.    . calcium carbonate (TUMS - DOSED IN MG ELEMENTAL CALCIUM) 500 MG chewable tablet Chew 1 tablet by mouth 2 (two) times daily as needed for indigestion or heartburn.   . cholecalciferol (VITAMIN D) 25 MCG tablet Take 1 tablet (1,000 Units total) by mouth daily.  . colchicine 0.6 MG tablet Take 1 tablet (0.6 mg total) by mouth 2 (two) times daily.  . colesevelam (WELCHOL) 625 MG tablet Take 1,875 mg by mouth 2 (two) times daily with a meal.   . gabapentin (NEURONTIN) 100 MG capsule Take 1 capsule (100 mg total) by mouth at bedtime.  . Maltodextrin-Xanthan Gum (RESOURCE THICKENUP CLEAR) POWD Take 120 g by mouth as needed (for nectar thick liquids).  . metoprolol tartrate (LOPRESSOR) 50 MG tablet Take 1 tablet (50 mg total) by mouth 2 (two) times daily.  . NON FORMULARY Diet - Dysphagia 2 with Nectar thick liquids  . NON FORMULARY Cpap during sleep - may bring from home and use home settings Twice A Day  . ondansetron (ZOFRAN) 4 MG tablet Take 4 mg by mouth every 6 (six) hours as needed for nausea or vomiting.  Sherren Mocha Supplies (SKIN PREP WIPES) MISC Apply skin prep to bilateral heels qshift for prevention. Every Shift  . pantoprazole (PROTONIX) 40 MG tablet Take 40 mg by mouth daily.  . polyethylene glycol (MIRALAX / GLYCOLAX) packet Take 17 g by  mouth daily as needed for mild constipation.    No facility-administered encounter medications on file as of 03/11/2019.      SIGNIFICANT DIAGNOSTIC EXAMS  TODAY;  03-04-19: ct of head:  1. Focal hyperdensity involving distal right MCA branches at the right sylvian fissure, age indeterminate, and may reflect focal atherosclerotic calcification or possibly intraluminal thrombus. Further assessment with CTA suggested. 2. ASPECTS is 10. 3. No other acute intracranial abnormality. 4. Underlying age-related cerebral atrophy with mild chronic small vessel ischemic disease.  LABS REVIEWED TODAY   03-05-19: hgb a1c 5.9; chol 137; ldl 74; trig 75; hdl 48 03-10-19: wbc 7.1; hgb 9.8; hct 327; mcv 97.9; plt 173; glucose 96; bun 41; creat 1.32; k+ 3.9; na++ 148; ca 8.5    Review of Systems  Unable to perform ROS: Medical condition    Physical Exam Constitutional:      General: She is not in acute distress.    Appearance: She is well-developed. She is obese. She is not diaphoretic.  Neck:     Musculoskeletal: Neck supple.     Thyroid: No thyromegaly.  Cardiovascular:     Rate and Rhythm: Normal rate and regular rhythm.     Pulses: Normal pulses.     Heart sounds: Normal heart sounds.  Pulmonary:     Effort: No respiratory distress.     Breath sounds: Normal breath sounds.     Comments: 02 dependent  Using accessory muscles for respirations  Abdominal:     General: Bowel sounds are normal. There is no distension.     Palpations: Abdomen is soft.     Tenderness: There is no abdominal tenderness.  Musculoskeletal:     Right lower leg: Edema present.     Left lower leg: Edema present.     Comments: Left hemiplegia   Lymphadenopathy:     Cervical: No cervical adenopathy.  Skin:    General: Skin is warm and dry.  Neurological:     Comments: Is aware      ASSESSMENT/ PLAN:   TODAY  1. Cerebrovascular accident (CVA) due to embolism of right middle cerebral artery :  neurologically without change in status: will continue eliquis 5 mg twice daily will continue therapy as directed and will monitor her status  2. Paroxsymal atrial fibrillation: heart rate is stable: will continue lopressor 50 mg twice daily for rate control will continue eliquis 5 mg twice daily   3. Acute diastolic (congestive) heart failure: is without change will continue bumex 1 mg daily will monitor   4. Obstructive sleep apnea: is stable uses CPAP  5. Dysphagia due to recent cerebral infarction: no signs of aspiration present: will continue nectar thick liquids.   6. Morbid obesity: weight is 190 pounds will monitor  7.  Chronic gout due to renal impairment without tophus unspecified site: is stable will continue allopurinol 100 mg twice daily and colchicine 0.6 mg twice daily   8. Mixed hyperlipidemia: is without change: will continue welchol 1875 mg twice daily   9. GERD without esophagitis: is stable will continue protonix 40 mg daily  10.  Other polyneuropathy: is stable will continue neurontin 100 mg nightly       MD is aware of resident's narcotic use and is in agreement with current plan of care. We will attempt to wean resident as apropriate   Ok Edwards NP [facility]  Contact [phone] Monday through Friday 8am- 5pm  After hours call [phone]

## 2019-03-12 ENCOUNTER — Emergency Department (HOSPITAL_COMMUNITY): Payer: PPO

## 2019-03-12 ENCOUNTER — Encounter (HOSPITAL_COMMUNITY): Payer: Self-pay | Admitting: Emergency Medicine

## 2019-03-12 ENCOUNTER — Inpatient Hospital Stay (HOSPITAL_COMMUNITY)
Admission: EM | Admit: 2019-03-12 | Discharge: 2019-03-17 | DRG: 291 | Disposition: E | Payer: PPO | Attending: Internal Medicine | Admitting: Internal Medicine

## 2019-03-12 ENCOUNTER — Other Ambulatory Visit: Payer: Self-pay

## 2019-03-12 DIAGNOSIS — Z993 Dependence on wheelchair: Secondary | ICD-10-CM

## 2019-03-12 DIAGNOSIS — I480 Paroxysmal atrial fibrillation: Secondary | ICD-10-CM | POA: Diagnosis not present

## 2019-03-12 DIAGNOSIS — R4182 Altered mental status, unspecified: Secondary | ICD-10-CM | POA: Diagnosis not present

## 2019-03-12 DIAGNOSIS — Z6829 Body mass index (BMI) 29.0-29.9, adult: Secondary | ICD-10-CM | POA: Diagnosis not present

## 2019-03-12 DIAGNOSIS — E669 Obesity, unspecified: Secondary | ICD-10-CM | POA: Diagnosis not present

## 2019-03-12 DIAGNOSIS — R1310 Dysphagia, unspecified: Secondary | ICD-10-CM | POA: Diagnosis present

## 2019-03-12 DIAGNOSIS — Z885 Allergy status to narcotic agent status: Secondary | ICD-10-CM

## 2019-03-12 DIAGNOSIS — M25551 Pain in right hip: Secondary | ICD-10-CM | POA: Diagnosis not present

## 2019-03-12 DIAGNOSIS — G8929 Other chronic pain: Secondary | ICD-10-CM | POA: Diagnosis present

## 2019-03-12 DIAGNOSIS — M545 Low back pain: Secondary | ICD-10-CM | POA: Diagnosis not present

## 2019-03-12 DIAGNOSIS — E870 Hyperosmolality and hypernatremia: Secondary | ICD-10-CM | POA: Diagnosis present

## 2019-03-12 DIAGNOSIS — J9601 Acute respiratory failure with hypoxia: Secondary | ICD-10-CM | POA: Diagnosis present

## 2019-03-12 DIAGNOSIS — R0600 Dyspnea, unspecified: Secondary | ICD-10-CM | POA: Diagnosis present

## 2019-03-12 DIAGNOSIS — K219 Gastro-esophageal reflux disease without esophagitis: Secondary | ICD-10-CM | POA: Diagnosis present

## 2019-03-12 DIAGNOSIS — E785 Hyperlipidemia, unspecified: Secondary | ICD-10-CM | POA: Diagnosis not present

## 2019-03-12 DIAGNOSIS — G4730 Sleep apnea, unspecified: Secondary | ICD-10-CM | POA: Diagnosis present

## 2019-03-12 DIAGNOSIS — Z66 Do not resuscitate: Secondary | ICD-10-CM | POA: Diagnosis not present

## 2019-03-12 DIAGNOSIS — Z9049 Acquired absence of other specified parts of digestive tract: Secondary | ICD-10-CM | POA: Diagnosis not present

## 2019-03-12 DIAGNOSIS — Z881 Allergy status to other antibiotic agents status: Secondary | ICD-10-CM

## 2019-03-12 DIAGNOSIS — R4020 Unspecified coma: Secondary | ICD-10-CM | POA: Diagnosis not present

## 2019-03-12 DIAGNOSIS — I130 Hypertensive heart and chronic kidney disease with heart failure and stage 1 through stage 4 chronic kidney disease, or unspecified chronic kidney disease: Secondary | ICD-10-CM | POA: Diagnosis not present

## 2019-03-12 DIAGNOSIS — M1A9XX0 Chronic gout, unspecified, without tophus (tophi): Secondary | ICD-10-CM | POA: Diagnosis not present

## 2019-03-12 DIAGNOSIS — R0902 Hypoxemia: Secondary | ICD-10-CM | POA: Diagnosis not present

## 2019-03-12 DIAGNOSIS — M810 Age-related osteoporosis without current pathological fracture: Secondary | ICD-10-CM | POA: Diagnosis present

## 2019-03-12 DIAGNOSIS — Z888 Allergy status to other drugs, medicaments and biological substances status: Secondary | ICD-10-CM

## 2019-03-12 DIAGNOSIS — Z79899 Other long term (current) drug therapy: Secondary | ICD-10-CM

## 2019-03-12 DIAGNOSIS — I5031 Acute diastolic (congestive) heart failure: Secondary | ICD-10-CM | POA: Diagnosis not present

## 2019-03-12 DIAGNOSIS — Z7901 Long term (current) use of anticoagulants: Secondary | ICD-10-CM

## 2019-03-12 DIAGNOSIS — J45909 Unspecified asthma, uncomplicated: Secondary | ICD-10-CM | POA: Diagnosis not present

## 2019-03-12 DIAGNOSIS — I5033 Acute on chronic diastolic (congestive) heart failure: Secondary | ICD-10-CM | POA: Diagnosis not present

## 2019-03-12 DIAGNOSIS — Z20828 Contact with and (suspected) exposure to other viral communicable diseases: Secondary | ICD-10-CM | POA: Diagnosis present

## 2019-03-12 DIAGNOSIS — N3010 Interstitial cystitis (chronic) without hematuria: Secondary | ICD-10-CM | POA: Diagnosis not present

## 2019-03-12 DIAGNOSIS — R0602 Shortness of breath: Secondary | ICD-10-CM | POA: Diagnosis not present

## 2019-03-12 DIAGNOSIS — Z8673 Personal history of transient ischemic attack (TIA), and cerebral infarction without residual deficits: Secondary | ICD-10-CM

## 2019-03-12 DIAGNOSIS — Z515 Encounter for palliative care: Secondary | ICD-10-CM | POA: Diagnosis not present

## 2019-03-12 DIAGNOSIS — I959 Hypotension, unspecified: Secondary | ICD-10-CM | POA: Diagnosis present

## 2019-03-12 DIAGNOSIS — N183 Chronic kidney disease, stage 3 (moderate): Secondary | ICD-10-CM | POA: Diagnosis present

## 2019-03-12 DIAGNOSIS — Z8249 Family history of ischemic heart disease and other diseases of the circulatory system: Secondary | ICD-10-CM

## 2019-03-12 DIAGNOSIS — R404 Transient alteration of awareness: Secondary | ICD-10-CM | POA: Diagnosis not present

## 2019-03-12 DIAGNOSIS — Z825 Family history of asthma and other chronic lower respiratory diseases: Secondary | ICD-10-CM

## 2019-03-12 LAB — CBC WITH DIFFERENTIAL/PLATELET
Abs Immature Granulocytes: 0.04 10*3/uL (ref 0.00–0.07)
Basophils Absolute: 0 10*3/uL (ref 0.0–0.1)
Basophils Relative: 0 %
Eosinophils Absolute: 0.2 10*3/uL (ref 0.0–0.5)
Eosinophils Relative: 2 %
HCT: 36.8 % (ref 36.0–46.0)
Hemoglobin: 10.6 g/dL — ABNORMAL LOW (ref 12.0–15.0)
Immature Granulocytes: 1 %
Lymphocytes Relative: 11 %
Lymphs Abs: 0.8 10*3/uL (ref 0.7–4.0)
MCH: 29.1 pg (ref 26.0–34.0)
MCHC: 28.8 g/dL — ABNORMAL LOW (ref 30.0–36.0)
MCV: 101.1 fL — ABNORMAL HIGH (ref 80.0–100.0)
Monocytes Absolute: 0.6 10*3/uL (ref 0.1–1.0)
Monocytes Relative: 8 %
Neutro Abs: 5.8 10*3/uL (ref 1.7–7.7)
Neutrophils Relative %: 78 %
Platelets: 221 10*3/uL (ref 150–400)
RBC: 3.64 MIL/uL — ABNORMAL LOW (ref 3.87–5.11)
RDW: 17.1 % — ABNORMAL HIGH (ref 11.5–15.5)
WBC: 7.4 10*3/uL (ref 4.0–10.5)
nRBC: 0 % (ref 0.0–0.2)

## 2019-03-12 LAB — BLOOD GAS, ARTERIAL
Acid-base deficit: 4 mmol/L — ABNORMAL HIGH (ref 0.0–2.0)
Bicarbonate: 20.7 mmol/L (ref 20.0–28.0)
FIO2: 44
O2 Saturation: 98.8 %
Patient temperature: 37
pCO2 arterial: 47.1 mmHg (ref 32.0–48.0)
pH, Arterial: 7.284 — ABNORMAL LOW (ref 7.350–7.450)
pO2, Arterial: 146 mmHg — ABNORMAL HIGH (ref 83.0–108.0)

## 2019-03-12 LAB — TROPONIN I (HIGH SENSITIVITY): Troponin I (High Sensitivity): 613 ng/L (ref <18)

## 2019-03-12 LAB — BASIC METABOLIC PANEL
Anion gap: 6 (ref 5–15)
BUN: 44 mg/dL — ABNORMAL HIGH (ref 8–23)
CO2: 24 mmol/L (ref 22–32)
Calcium: 9.1 mg/dL (ref 8.9–10.3)
Chloride: 122 mmol/L — ABNORMAL HIGH (ref 98–111)
Creatinine, Ser: 1.36 mg/dL — ABNORMAL HIGH (ref 0.44–1.00)
GFR calc Af Amer: 41 mL/min — ABNORMAL LOW (ref >60)
GFR calc non Af Amer: 36 mL/min — ABNORMAL LOW (ref >60)
Glucose, Bld: 128 mg/dL — ABNORMAL HIGH (ref 70–99)
Potassium: 3.8 mmol/L (ref 3.5–5.1)
Sodium: 152 mmol/L — ABNORMAL HIGH (ref 135–145)

## 2019-03-12 LAB — SARS CORONAVIRUS 2 BY RT PCR (HOSPITAL ORDER, PERFORMED IN ~~LOC~~ HOSPITAL LAB): SARS Coronavirus 2: NEGATIVE

## 2019-03-12 LAB — LACTIC ACID, PLASMA: Lactic Acid, Venous: 1.8 mmol/L (ref 0.5–1.9)

## 2019-03-12 LAB — BRAIN NATRIURETIC PEPTIDE: B Natriuretic Peptide: 510 pg/mL — ABNORMAL HIGH (ref 0.0–100.0)

## 2019-03-12 MED ORDER — FUROSEMIDE 10 MG/ML IJ SOLN: 40.0000 mg | Freq: Two times a day (BID) | INTRAMUSCULAR | Status: DC

## 2019-03-12 MED ORDER — LORAZEPAM 2 MG/ML IJ SOLN: 1.0000 mg | INTRAMUSCULAR | Status: DC | PRN

## 2019-03-12 MED ORDER — METOPROLOL TARTRATE 25 MG PO TABS: 25.0000 mg | ORAL_TABLET | Freq: Two times a day (BID) | ORAL | Status: DC

## 2019-03-12 MED ORDER — ALLOPURINOL 100 MG PO TABS: 100.0000 mg | ORAL_TABLET | Freq: Two times a day (BID) | ORAL | Status: DC

## 2019-03-12 MED ORDER — LORAZEPAM 2 MG/ML IJ SOLN
INTRAMUSCULAR | Status: AC
  Filled 2019-03-12: qty 1

## 2019-03-12 MED ORDER — FUROSEMIDE 10 MG/ML IJ SOLN
60.0000 mg | Freq: Once | INTRAMUSCULAR | Status: AC
  Administered 2019-03-12: 10:00:00 60 mg via INTRAVENOUS
  Filled 2019-03-12: qty 6

## 2019-03-12 MED ORDER — SODIUM CHLORIDE 0.9 % IV SOLN: 250.0000 mL | INTRAVENOUS | Status: DC | PRN

## 2019-03-12 MED ORDER — VITAMIN D3 25 MCG PO TABS: 1000.0000 [IU] | ORAL_TABLET | Freq: Every day | ORAL | Status: DC

## 2019-03-12 MED ORDER — STARCH (THICKENING) PO POWD
ORAL | Status: DC | PRN
  Filled 2019-03-12: qty 227

## 2019-03-12 MED ORDER — APIXABAN 5 MG PO TABS: 5.0000 mg | ORAL_TABLET | Freq: Two times a day (BID) | ORAL | Status: DC

## 2019-03-12 MED ORDER — SODIUM CHLORIDE 0.9% FLUSH: 3.0000 mL | Freq: Two times a day (BID) | INTRAVENOUS | Status: DC

## 2019-03-12 MED ORDER — PANTOPRAZOLE SODIUM 40 MG PO TBEC: 40.0000 mg | DELAYED_RELEASE_TABLET | Freq: Every day | ORAL | Status: DC

## 2019-03-12 MED ORDER — MORPHINE SULFATE (PF) 2 MG/ML IV SOLN
2.0000 mg | Freq: Once | INTRAVENOUS | Status: AC
  Administered 2019-03-12: 2 mg via INTRAVENOUS
  Filled 2019-03-12: qty 1

## 2019-03-12 MED ORDER — ACETAMINOPHEN 325 MG PO TABS: 650.0000 mg | ORAL_TABLET | Freq: Four times a day (QID) | ORAL | Status: DC | PRN

## 2019-03-12 MED ORDER — COLESEVELAM HCL 625 MG PO TABS: 1875.0000 mg | ORAL_TABLET | Freq: Two times a day (BID) | ORAL | Status: DC

## 2019-03-12 MED ORDER — LORAZEPAM 2 MG/ML IJ SOLN
2.0000 mg | INTRAMUSCULAR | Status: DC | PRN
  Administered 2019-03-12: 14:00:00 2 mg via INTRAVENOUS

## 2019-03-12 MED ORDER — ONDANSETRON HCL 4 MG PO TABS: 4.0000 mg | ORAL_TABLET | Freq: Four times a day (QID) | ORAL | Status: DC | PRN

## 2019-03-12 MED ORDER — RESOURCE THICKENUP CLEAR PO POWD: 1.0000 | ORAL | Status: DC | PRN

## 2019-03-12 MED ORDER — SODIUM CHLORIDE 0.9% FLUSH: 3.0000 mL | INTRAVENOUS | Status: DC | PRN

## 2019-03-12 MED ORDER — ACETAMINOPHEN 650 MG RE SUPP: 650.0000 mg | Freq: Four times a day (QID) | RECTAL | Status: DC | PRN

## 2019-03-12 MED ORDER — MORPHINE SULFATE (PF) 2 MG/ML IV SOLN
2.0000 mg | Freq: Once | INTRAVENOUS | Status: AC
  Administered 2019-03-12: 14:00:00 2 mg via INTRAVENOUS
  Filled 2019-03-12: qty 1

## 2019-03-12 MED ORDER — GABAPENTIN 100 MG PO CAPS: 100.0000 mg | ORAL_CAPSULE | Freq: Every day | ORAL | Status: DC

## 2019-03-12 MED ORDER — ONDANSETRON HCL 4 MG/2ML IJ SOLN: 4.0000 mg | Freq: Four times a day (QID) | INTRAMUSCULAR | Status: DC | PRN

## 2019-03-13 DIAGNOSIS — M1A30X0 Chronic gout due to renal impairment, unspecified site, without tophus (tophi): Secondary | ICD-10-CM | POA: Insufficient documentation

## 2019-03-13 DIAGNOSIS — G629 Polyneuropathy, unspecified: Secondary | ICD-10-CM | POA: Insufficient documentation

## 2019-03-13 DIAGNOSIS — K219 Gastro-esophageal reflux disease without esophagitis: Secondary | ICD-10-CM | POA: Insufficient documentation

## 2019-03-17 LAB — CULTURE, BLOOD (ROUTINE X 2)
Culture: NO GROWTH
Culture: NO GROWTH
Special Requests: ADEQUATE
Special Requests: ADEQUATE

## 2019-03-17 NOTE — Progress Notes (Signed)
ED RN had notified me about decreasing blood pressures.  I immediately went to bedside to discuss placement of central venous line and initiation of pressors, but noticed even further decreasing blood pressure readings and it appeared as though the patient was actively dying.  I discussed the situation with the daughter at bedside who has now amended her decision regarding CPR and states that she would like for her mother to be a full DO NOT RESUSCITATE and to remain comfortable.  Daughter states that the patient would not desire any further aggressive intervention to include central venous line placement or use of pressors.  We will plan to push morphine and transition to nasal cannula oxygen for now and allow for natural death on comfort measures protocol.  Anticipate in-hospital death within hours.

## 2019-03-17 NOTE — Progress Notes (Signed)
Pt placed on BIPAP per MD order.  Tolerating well at this time.  RT will continue to monitor.

## 2019-03-17 NOTE — ED Notes (Signed)
One blood culture obtained at this time and sent to lab

## 2019-03-17 NOTE — ED Notes (Signed)
Per Dr. Laverta Baltimore - patient repositioned and given ice chips

## 2019-03-17 NOTE — Death Summary Note (Signed)
Physician Discharge Summary  KHYLIE LARMORE ZDG:[id number] DOB: Sep 02, 1932 DOA: 03/31/2019  PCP: Celene Squibb, MD  Admit date: March 31, 2019  Death date: Mar 31, 2019 1405  Admitted From:Home  Disposition:  Expired   Brief/Interim Summary: Per HPI: KRISTELLE CAVALLARO is a 85 y.o. 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  She was started on 6 L nasal cannula and transported via EMS to the ED.  She has been started on BiPAP on account of her distress and daughter is currently at bedside stating that she would not want intubation.  No recent coughing, fever, chills, orthopnea, or lower extremity edema noted.  She denies any chest pain.  History is difficult to obtain from patient and is mostly obtained from daughter at bedside.  Patient was admitted for acute hypoxemic respiratory failure secondary to pulmonary edema from acute on chronic diastolic heart failure decompensation.  She was started on BiPAP support and intubation was avoided due to patient wishes.  She was started on Lasix for diuresis and unfortunately began to have decreasing blood pressures.  She was also noted to have a high-sensitivity troponin in the 600s.  Daughter at bedside stated that patient would not want any aggressive cardiac work-up or intervention and more than likely would not want aggressive treatment while here.  I had come to the bedside to discuss the patient's hypotension and the need for central venous line placement as well as possible pressors and the daughter had declined these interventions.  I had also discussed the imminent threat of a cardiac arrest and the likely  need for intubation and mechanical ventilation should that need arise in the and the daughter clearly stated that her mother would not want CPR if that were to be the case.  She was then transition to comfort measures per family wishes and was placed on nasal cannula oxygen along with morphine IV pushes.  Palliative care was involved and had decreased patient's oxygen supplementation and had given more morphine for comfort along with Ativan.  She had no signs of distress and patient expired peacefully at 1405 on March 31, 2019.  Discharge Diagnoses:  Active Problems:   Acute diastolic (congestive) heart failure (HCC)   Allergies  Allergen Reactions  . Ace Inhibitors Cough    Occurred with Monopril  . Ciprofloxacin     Sick  . Hydrocodone-Acetaminophen Nausea Only  . Lipitor [Atorvastatin]     Tired, weak muscles  . Monopril [Fosinopril Sodium]     Cough   . Morphine And Related   . Nitrofurantoin Monohyd Macro Nausea Only    Also skin rash, edema   . Percocet [Oxycodone-Acetaminophen] Nausea Only  . Statins Other (See Comments)    Myalgias; prior exposure to pravastatin, atorvastatin, lovastatin, Lescol, simvastatin and ezetimibe  . Flagyl [Metronidazole] Nausea Only  . Phenazopyridine Rash    Consultations:  Palliative Care   Procedures/Studies: Ct Code Stroke Cta Head W/wo Contrast  Result Date: 03/05/2019 CLINICAL DATA:  Initial evaluation for acute headache, speech difficulty. EXAM: CT ANGIOGRAPHY HEAD AND NECK TECHNIQUE: Multidetector CT imaging of the head and neck was performed using the standard protocol during bolus administration of intravenous contrast. Multiplanar CT image reconstructions and MIPs were  obtained to evaluate the vascular anatomy. Carotid stenosis measurements (when applicable) are obtained utilizing NASCET criteria, using the distal internal carotid diameter as the denominator. CONTRAST:  60mL OMNIPAQUE IOHEXOL 350 MG/ML SOLN COMPARISON:  Prior noncontrast  head CT from earlier the same day. FINDINGS: CTA NECK FINDINGS Aortic arch: Visualized aortic arch of normal caliber with normal branch pattern. Moderate atherosclerotic change seen about the aortic arch and origin of the great vessels. No hemodynamically significant or high-grade stenosis. Penetrating atheromatous plaque noted at the undersurface of the aortic arch. Right carotid system: Right CCA tortuous and medialized into the retropharyngeal space, but widely patent to the carotid bifurcation without stenosis. Eccentric calcified plaque about the right bifurcation without hemodynamically significant stenosis. Right ICA tortuous but widely patent to the skull base without stenosis, dissection or occlusion. Left carotid system: Left CCA tortuous and medialized into the retropharyngeal space but widely patent to the bifurcation without stenosis. Eccentric calcified plaque at the left bifurcation without hemodynamically significant stenosis. Left ICA tortuous but widely patent distally to the skull base without stenosis, dissection, or occlusion. Vertebral arteries: Both vertebral arteries arise from the subclavian arteries. Right vertebral artery dominant, with a diffusely hypoplastic left vertebral artery. Focal atheromatous narrowing of approximately 50% at the origin of the dominant right vertebral artery noted (series 8, image 272). Right vertebral otherwise widely patent within the neck. Hypoplastic left vertebral artery widely patent as well without occlusion or other abnormality. Skeleton: No acute osseous finding. No discrete lytic or blastic osseous lesions. Mild to moderate cervical spondylolysis at C4-5 through C6-7. Other neck: No other acute soft tissue abnormality within the neck. Salivary glands within normal limits. Thyroid normal. No adenopathy. Upper chest: Visualized upper chest demonstrates no acute finding. Review of the MIP images confirms the above findings CTA HEAD FINDINGS Anterior  circulation: Petrous segments patent bilaterally. Scattered atheromatous plaque within the cavernous/supraclinoid ICAs without hemodynamically significant stenosis. A1 segments widely patent. Normal anterior communicating artery. Anterior cerebral arteries widely patent to their distal aspects. M1 segments widely patent. Normal MCA bifurcations. On the right, there is abrupt occlusion of a distal right M2/M3 branch at the right sylvian fissure, corresponding with hypodensity seen on prior CT (series 8, image 75). Finding favored to reflect subocclusive thrombus, although a focal high-grade stenosis not entirely excluded some scant irregular flow seen distally. Otherwise, distal MCA branches well perfused without evidence for large vessel occlusion. Posterior circulation: Dominant right vertebral artery patent to the vertebrobasilar junction without stenosis. Hypoplastic left vertebral artery patent as well, although minimally contributes to the posterior circulation. Patent right PICA. Left PICA not well seen. Basilar widely patent to its distal aspect without stenosis. Superior cerebral arteries patent bilaterally. Both posterior cerebral arteries well perfused to their distal aspects without stenosis. Venous sinuses: Patent. Anatomic variants: None significant. Review of the MIP images confirms the above findings IMPRESSION: 1. Abrupt occlusion/near occlusion of a distal right M2/proximal right M3 branch, with scant irregular flow distally. Finding corresponds with hyperdensity seen on prior noncontrast head CT, and favored to reflect subocclusive thrombus, although a focal high-grade stenosis not entirely excluded. Correlation with symptomatology recommended. 2. Scattered atherosclerotic change elsewhere throughout the major arterial vasculature of the head and neck. No other hemodynamically significant or correctable stenosis. 3. Diffuse tortuosity of the major arterial vasculature of the neck, suggesting  chronic underlying hypertension. 4. Please note that this examination was interpreted during an unexpected Downtime, and dictated 1 day later on 03/05/2019. Critical Value/emergent results were called by telephone  at the time of interpretation on 03/04/2019 at 9:17 pm to Dr. Alona Bene , who verbally acknowledged these results. Findings also discussed with Dr. Amada Jupiter at approximately 9:30 p.m. on 03/04/2019. Electronically Signed   By: Rise Mu M.D.   On: 03/05/2019 19:55   Ct Head Wo Contrast  Result Date: 03/06/2019 CLINICAL DATA:  Stroke follow-up EXAM: CT HEAD WITHOUT CONTRAST TECHNIQUE: Contiguous axial images were obtained from the base of the skull through the vertex without intravenous contrast. COMPARISON:  CT and MRI March 05, 2019 same day FINDINGS: Brain: Motion artifact limits evaluation near the vertex. There is evolving hypoattenuation in the region of the right posterior insula, parietal lobe and corona radiata corresponding well to the region of acute infarct seen on same day comparison MRI. No evidence of hemorrhagic transformation. No new areas of hypoattenuation or large territory infarct. Findings are on a background of patchy white matter hypoattenuation compatible with chronic microvascular angiopathy. Symmetric prominence of the ventricles, cisterns and sulci compatible with parenchymal volume loss. Vascular: Atherosclerotic calcification of the carotid siphons and intradural vertebral arteries. No unexpected hyperdense vessel. Skull: Non motion degraded portions of the skull are unremarkable. Sinuses/Orbits: Paranasal sinuses and mastoid air cells are predominantly clear. Orbital structures are unremarkable aside from prior lens extractions. Other: None IMPRESSION: Expected evolution of the region of infarct seen in the right posterior insula, parietal lobe and corona radiata. No hemorrhagic transformation. Electronically Signed   By: Kreg Shropshire M.D.   On: 03/06/2019  01:48   Ct Head Wo Contrast  Result Date: 03/05/2019 CLINICAL DATA:  Post tPA, new facial droop EXAM: CT HEAD WITHOUT CONTRAST TECHNIQUE: Contiguous axial images were obtained from the base of the skull through the vertex without intravenous contrast. COMPARISON:  CT 03/04/2019 FINDINGS: Brain: Stable punctate calcification at the level of a right M3 branch artery where the vessel abruptly tapers on CT angiographic images. No free or differentiation loss or other CT evidence of acute large territory infarct. No acute hemorrhage. No mass effect or midline shift. Mild diffuse parenchymal volume loss is noted with prominence of the ventricles, cisterns and sulci. Patchy areas of white matter hypoattenuation are most compatible with chronic microvascular angiopathy. Vascular: Atherosclerotic calcification of the carotid siphons and intradural vertebral arteries. Skull: No calvarial fracture or suspicious osseous lesion. No scalp swelling or hematoma. Sinuses/Orbits: Paranasal sinuses and mastoid air cells are predominantly clear. Orbital structures are unremarkable aside from prior lens extractions. Other: None. IMPRESSION: No acute intracranial abnormality. No significant interval change from CT angiographic images performed 1 day prior. Stable punctate hyper density at the level of a right M3 branch artery where the vessel abruptly tapers on CT angiographic images. Electronically Signed   By: Kreg Shropshire M.D.   On: 03/05/2019 22:15   Ct Code Stroke Cta Neck W/wo Contrast  Result Date: 03/05/2019 CLINICAL DATA:  Initial evaluation for acute headache, speech difficulty. EXAM: CT ANGIOGRAPHY HEAD AND NECK TECHNIQUE: Multidetector CT imaging of the head and neck was performed using the standard protocol during bolus administration of intravenous contrast. Multiplanar CT image reconstructions and MIPs were obtained to evaluate the vascular anatomy. Carotid stenosis measurements (when applicable) are obtained  utilizing NASCET criteria, using the distal internal carotid diameter as the denominator. CONTRAST:  60mL OMNIPAQUE IOHEXOL 350 MG/ML SOLN COMPARISON:  Prior noncontrast head CT from earlier the same day. FINDINGS: CTA NECK FINDINGS Aortic arch: Visualized aortic arch of normal caliber with normal branch pattern. Moderate atherosclerotic change seen about the  aortic arch and origin of the great vessels. No hemodynamically significant or high-grade stenosis. Penetrating atheromatous plaque noted at the undersurface of the aortic arch. Right carotid system: Right CCA tortuous and medialized into the retropharyngeal space, but widely patent to the carotid bifurcation without stenosis. Eccentric calcified plaque about the right bifurcation without hemodynamically significant stenosis. Right ICA tortuous but widely patent to the skull base without stenosis, dissection or occlusion. Left carotid system: Left CCA tortuous and medialized into the retropharyngeal space but widely patent to the bifurcation without stenosis. Eccentric calcified plaque at the left bifurcation without hemodynamically significant stenosis. Left ICA tortuous but widely patent distally to the skull base without stenosis, dissection, or occlusion. Vertebral arteries: Both vertebral arteries arise from the subclavian arteries. Right vertebral artery dominant, with a diffusely hypoplastic left vertebral artery. Focal atheromatous narrowing of approximately 50% at the origin of the dominant right vertebral artery noted (series 8, image 272). Right vertebral otherwise widely patent within the neck. Hypoplastic left vertebral artery widely patent as well without occlusion or other abnormality. Skeleton: No acute osseous finding. No discrete lytic or blastic osseous lesions. Mild to moderate cervical spondylolysis at C4-5 through C6-7. Other neck: No other acute soft tissue abnormality within the neck. Salivary glands within normal limits. Thyroid normal.  No adenopathy. Upper chest: Visualized upper chest demonstrates no acute finding. Review of the MIP images confirms the above findings CTA HEAD FINDINGS Anterior circulation: Petrous segments patent bilaterally. Scattered atheromatous plaque within the cavernous/supraclinoid ICAs without hemodynamically significant stenosis. A1 segments widely patent. Normal anterior communicating artery. Anterior cerebral arteries widely patent to their distal aspects. M1 segments widely patent. Normal MCA bifurcations. On the right, there is abrupt occlusion of a distal right M2/M3 branch at the right sylvian fissure, corresponding with hypodensity seen on prior CT (series 8, image 75). Finding favored to reflect subocclusive thrombus, although a focal high-grade stenosis not entirely excluded some scant irregular flow seen distally. Otherwise, distal MCA branches well perfused without evidence for large vessel occlusion. Posterior circulation: Dominant right vertebral artery patent to the vertebrobasilar junction without stenosis. Hypoplastic left vertebral artery patent as well, although minimally contributes to the posterior circulation. Patent right PICA. Left PICA not well seen. Basilar widely patent to its distal aspect without stenosis. Superior cerebral arteries patent bilaterally. Both posterior cerebral arteries well perfused to their distal aspects without stenosis. Venous sinuses: Patent. Anatomic variants: None significant. Review of the MIP images confirms the above findings IMPRESSION: 1. Abrupt occlusion/near occlusion of a distal right M2/proximal right M3 branch, with scant irregular flow distally. Finding corresponds with hyperdensity seen on prior noncontrast head CT, and favored to reflect subocclusive thrombus, although a focal high-grade stenosis not entirely excluded. Correlation with symptomatology recommended. 2. Scattered atherosclerotic change elsewhere throughout the major arterial vasculature of the  head and neck. No other hemodynamically significant or correctable stenosis. 3. Diffuse tortuosity of the major arterial vasculature of the neck, suggesting chronic underlying hypertension. 4. Please note that this examination was interpreted during an unexpected Downtime, and dictated 1 day later on 03/05/2019. Critical Value/emergent results were called by telephone at the time of interpretation on 03/04/2019 at 9:17 pm to Dr. Alona Bene , who verbally acknowledged these results. Findings also discussed with Dr. Amada Jupiter at approximately 9:30 p.m. on 03/04/2019. Electronically Signed   By: Rise Mu M.D.   On: 03/05/2019 19:55   Mr Brain Wo Contrast  Result Date: 03/05/2019 CLINICAL DATA:  Stroke. EXAM: MRI HEAD WITHOUT CONTRAST TECHNIQUE: Multiplanar,  multiecho pulse sequences of the brain and surrounding structures were obtained without intravenous contrast. COMPARISON:  CT head 03/05/2019 FINDINGS: Brain: Acute infarct in the right posterior insula and right parietal lobe and corona radiata. No associated hemorrhage. Mild atrophy. No hydrocephalus or midline shift. Negative for hemorrhage or mass. Mild chronic microvascular ischemic changes in the white matter. Vascular: Normal arterial flow voids Skull and upper cervical spine: Negative Sinuses/Orbits: Mild mucosal edema paranasal sinuses. Bilateral cataract surgery Other: None IMPRESSION: Acute infarct right posterior insula and parietal lobe and corona radiata. No associated hemorrhage Atrophy and mild chronic microvascular ischemic change in the white matter. Electronically Signed   By: Marlan Palau M.D.   On: 03/05/2019 18:49   Dg Pelvis Portable  Result Date: March 19, 2019 CLINICAL DATA:  Right hip pain.  No known injury. EXAM: PORTABLE PELVIS 1-2 VIEWS COMPARISON:  None. FINDINGS: There is no evidence of pelvic fracture or diastasis. No pelvic bone lesions are seen. IMPRESSION: Negative. Electronically Signed   By: Lupita Raider  M.D.   On: 03-19-19 09:50   Dg Chest Port 1 View  Result Date: 03/19/2019 CLINICAL DATA:  Shortness of breath. EXAM: PORTABLE CHEST 1 VIEW COMPARISON:  Radiograph of March 09, 2019. FINDINGS: Stable cardiomegaly. Central pulmonary vascular congestion is noted. Bibasilar opacities are noted concerning for edema with small pleural effusions. No pneumothorax is noted. Bony thorax is unremarkable. IMPRESSION: Stable cardiomegaly with central pulmonary vascular congestion is noted, with bibasilar opacities concerning pulmonary edema. Small pleural effusions may be present. Electronically Signed   By: Lupita Raider M.D.   On: Mar 19, 2019 07:44   Dg Chest Port 1 View  Result Date: 03/09/2019 CLINICAL DATA:  Shortness of breath EXAM: PORTABLE CHEST 1 VIEW COMPARISON:  03/07/2019 FINDINGS: Patient is rotated. Cardiomediastinal contours appear stable. Similar bibasilar opacities, likely with small bilateral pleural effusions. No pneumothorax. IMPRESSION: Persistent bibasilar opacities with small bilateral pleural effusions. Electronically Signed   By: Duanne Guess M.D.   On: 03/09/2019 12:55   Dg Chest Port 1 View  Result Date: 03/07/2019 CLINICAL DATA:  CHF EXAM: PORTABLE CHEST 1 VIEW COMPARISON:  03/04/2019 FINDINGS: Unchanged AP portable examination with small bilateral layering pleural effusions and/or atelectasis. No new airspace opacity. Cardiomegaly. IMPRESSION: Unchanged AP portable examination with small bilateral layering pleural effusions and/or atelectasis. No new airspace opacity. Cardiomegaly. Electronically Signed   By: Lauralyn Primes M.D.   On: 03/07/2019 13:58   Dg Chest Portable 1 View  Result Date: 03/06/2019 CLINICAL DATA:  Shortness of breath EXAM: Chest portable one view COMPARISON:  06/29/2011 FINDINGS: Cardiomegaly. Low lung volumes with mild vascular congestion. No overt edema, confluent opacities or effusions. No acute bony abnormality. IMPRESSION: Cardiomegaly with vascular  congestion.  Low lung volumes. Electronically Signed   By: Charlett Nose M.D.   On: 03/05/2019 16:11   Dg Swallowing Func-speech Pathology  Result Date: 03/10/2019 Objective Swallowing Evaluation: Type of Study: MBS-Modified Barium Swallow Study  Patient Details Name: AARIAH GODETTE MRN: [id number] Date of Birth: 03-28-1933 Today's Date: 03/10/2019 Time: SLP Start Time (ACUTE ONLY): 1319 -SLP Stop Time (ACUTE ONLY): 1340 SLP Time Calculation (min) (ACUTE ONLY): 21 min Past Medical History: Past Medical History: Diagnosis Date . Asthma  . Breast mass   Left . Chronic kidney disease   Borderline with serum creatinine at the upper limit of normal in 10/2011 . Chronic low back pain  . Diverticular disease   acute diverticulitis in 02/2010 . Dyspnea on exertion 2010 . Gastroesophageal reflux  disease   hiatal hernia . Hyperlipidemia   Lipid profile 10/2011:240, 132, 52, 162 . Hypertension   Lab  10/2011: Normal CMet except GFR of 50, normal microalbumin, normal CBC, uric acid of 7.9, A1c of 6.1, normal urinalysis, low vitamin D; negative stress nuclear study in 2008 . Obesity  . Osteoporosis  . Paroxysmal atrial fibrillation (HCC)  . Sleep apnea 2010  Severe . Syncope 06/2011 . Urinary incontinence   Interstitial cystitis Past Surgical History: Past Surgical History: Procedure Laterality Date . CATARACT EXTRACTION   . CHOLECYSTECTOMY  1993 . CHOLECYSTECTOMY  1993 . COLONOSCOPY  2008  Negative screening study . DILATION AND CURETTAGE OF UTERUS  1986 . LUMBAR LAMINECTOMY  1992 . ORTHOPEDIC SURGERY   . TUBAL LIGATION   HPI: 85 y.o. female admitted for R CVA, presenting with suspected dysarthria and aphasia. On 8/19 she began having difficulty with understanding as well as with finding words. Reported mild SOB. On 8/20 13:12, she was alert and oriented, able to give a clear and coherent history, able to name simple objects and repeat without difficulty, and did not have evidence of neglect. Left facial weakness and mild strengh  weakness in left arm. Nursing reports facial droop has worsened. She has a history of hypertension, hyperlipidemia, atrial fibrillation not on anticoagulation. Barium esophagram 2015 with Diffuse age-related impairment of esophageal motility, Mild laryngeal penetration without aspiration, GERD. Repeat MBS today for possible upgrade in solids and/or liquids.  Subjective: requires cueing, daughter at bedside Assessment / Plan / Recommendation CHL IP CLINICAL IMPRESSIONS 03/10/2019 Clinical Impression Pt's swallow function has improved mildy from prior study. Increased oral control and transit without significant left buccal pocketing with continued left facial leakage. Initiation of swallow was mostly timely. Thin barium penetrated to cords and silently aspirated during the swallow. Due to obstruction of shoulder could not view swallow with chin tuck. Suspect airway intrusion as pt produced reflexive throat clear x 2. Pt able to upgrade to nectar consistency and recommend continue Dys 2 with further management at SNF for texture upgrade. Pt being discharged today to SNF. Recommend return for repeat MBS in 4 weeks for potential improvement.    SLP Visit Diagnosis Dysphagia, oropharyngeal phase (R13.12) Attention and concentration deficit following -- Frontal lobe and executive function deficit following -- Impact on safety and function Moderate aspiration risk   CHL IP TREATMENT RECOMMENDATION 03/10/2019 Treatment Recommendations Therapy as outlined in treatment plan below   Prognosis 03/10/2019 Prognosis for Safe Diet Advancement Good Barriers to Reach Goals -- Barriers/Prognosis Comment -- [facility] IP DIET RECOMMENDATION 03/10/2019 SLP Diet Recommendations Nectar thick liquid;Dysphagia 2 (Fine chop) solids Liquid Administration via Cup Medication Administration Crushed with puree Compensations Lingual sweep for clearance of pocketing;Other (Comment) Postural Changes Seated upright at 90 degrees   CHL IP OTHER RECOMMENDATIONS  03/10/2019 Recommended Consults -- Oral Care Recommendations Oral care BID Other Recommendations --   [facility] IP FOLLOW UP RECOMMENDATIONS 03/10/2019 Follow up Recommendations Skilled Nursing facility   [facility] IP FREQUENCY AND DURATION 03/10/2019 Speech Therapy Frequency (ACUTE ONLY) min 2x/week Treatment Duration 2 weeks      CHL IP ORAL PHASE 03/10/2019 Oral Phase Impaired Oral - Pudding Teaspoon -- Oral - Pudding Cup -- Oral - Honey Teaspoon -- Oral - Honey Cup -- Oral - Nectar Teaspoon -- Oral - Nectar Cup Lingual/palatal residue;Left anterior bolus loss Oral - Nectar Straw -- Oral - Thin Teaspoon -- Oral - Thin Cup WFL Oral - Thin Straw -- Oral - Puree -- Oral -  Mech Soft -- Oral - Regular Delayed oral transit Oral - Multi-Consistency -- Oral - Pill -- Oral Phase - Comment --  CHL IP PHARYNGEAL PHASE 03/10/2019 Pharyngeal Phase Impaired Pharyngeal- Pudding Teaspoon -- Pharyngeal -- Pharyngeal- Pudding Cup -- Pharyngeal -- Pharyngeal- Honey Teaspoon -- Pharyngeal -- Pharyngeal- Honey Cup Delayed swallow initiation-vallecula Pharyngeal -- Pharyngeal- Nectar Teaspoon -- Pharyngeal -- Pharyngeal- Nectar Cup Penetration/Aspiration during swallow Pharyngeal Material enters airway, remains ABOVE vocal cords then ejected out Pharyngeal- Nectar Straw -- Pharyngeal -- Pharyngeal- Thin Teaspoon -- Pharyngeal -- Pharyngeal- Thin Cup Penetration/Aspiration during swallow Pharyngeal Material enters airway, passes BELOW cords without attempt by patient to eject out (silent aspiration) Pharyngeal- Thin Straw -- Pharyngeal -- Pharyngeal- Puree -- Pharyngeal -- Pharyngeal- Mechanical Soft -- Pharyngeal -- Pharyngeal- Regular WFL Pharyngeal -- Pharyngeal- Multi-consistency -- Pharyngeal -- Pharyngeal- Pill -- Pharyngeal -- Pharyngeal Comment --  CHL IP CERVICAL ESOPHAGEAL PHASE 03/10/2019 Cervical Esophageal Phase WFL Pudding Teaspoon -- Pudding Cup -- Honey Teaspoon -- Honey Cup -- Nectar Teaspoon -- Nectar Cup -- Nectar Straw -- Thin  Teaspoon -- Thin Cup -- Thin Straw -- Puree -- Mechanical Soft -- Regular -- Multi-consistency -- Pill -- Cervical Esophageal Comment -- Royce Macadamia 03/10/2019, 5:31 PM    Breck Coons Litaker M.Ed Sports administrator Pager [phone] Office [phone]           Dg Swallowing Func-speech Pathology  Result Date: 03/05/2019 Objective Swallowing Evaluation: Type of Study: MBS-Modified Barium Swallow Study  Patient Details Name: BLAKELYNN SCHEELER MRN: [id number] Date of Birth: 03-27-1933 Today's Date: 03/05/2019 Time: SLP Start Time (ACUTE ONLY): 1043 -SLP Stop Time (ACUTE ONLY): 1100 SLP Time Calculation (min) (ACUTE ONLY): 17 min Past Medical History: Past Medical History: Diagnosis Date . Asthma  . Breast mass   Left . Chronic kidney disease   Borderline with serum creatinine at the upper limit of normal in 10/2011 . Chronic low back pain  . Diverticular disease   acute diverticulitis in 02/2010 . Dyspnea on exertion 2010 . Gastroesophageal reflux disease   hiatal hernia . Hyperlipidemia   Lipid profile 10/2011:240, 132, 52, 162 . Hypertension   Lab  10/2011: Normal CMet except GFR of 50, normal microalbumin, normal CBC, uric acid of 7.9, A1c of 6.1, normal urinalysis, low vitamin D; negative stress nuclear study in 2008 . Obesity  . Osteoporosis  . Paroxysmal atrial fibrillation (HCC)  . Sleep apnea 2010  Severe . Syncope 06/2011 . Urinary incontinence   Interstitial cystitis Past Surgical History: Past Surgical History: Procedure Laterality Date . CATARACT EXTRACTION   . CHOLECYSTECTOMY  1993 . CHOLECYSTECTOMY  1993 . COLONOSCOPY  2008  Negative screening study . DILATION AND CURETTAGE OF UTERUS  1986 . LUMBAR LAMINECTOMY  1992 . ORTHOPEDIC SURGERY   . TUBAL LIGATION   HPI: 85 y.o. female admitted for R CVA, presenting with suspected dysarthria and aphasia. On 8/19 she began having difficulty with understanding as well as with finding words. Reported mild SOB. On 8/20 13:12, she was alert and oriented,  able to give a clear and coherent history, able to name simple objects and repeat without difficulty, and did not have evidence of neglect. Left facial weakness and mild strengh weakness in left arm. Nursing reports facial droop has worsened. She has a history of hypertension, hyperlipidemia, atrial fibrillation not on anticoagulation. Barium esophagram 2015 with Diffuse age-related impairment of esophageal motility, Mild laryngeal penetration without aspiration, GERD.  No data recorded Assessment / Plan / Recommendation CHL IP CLINICAL  IMPRESSIONS 03/05/2019 Clinical Impression Pt exhibited moderate oral and mild pharyngeal dysphagia with laryngeal penetration of thin and nectar. Pt has significantly diminished left facial sensation therefore she could not detect frank loss of boluses or residue with solid in buccal cavity. Swallow reached pt's valleculae and pyriform sinuses and remained briefly prior to initiating swallow. Penetration episodes with thin occured during the swallow intermittently flash and mild amount remained. Chin tuck did not appear to be effective with thin. Nectar thick penetration came close to her vocal cords without pt awareness. Increased timing and coordination of musculature with honey thick . Recommend pt initiate Dys 1, honey thick liquids, crush pills and full supervision. ST will continue to follow.       SLP Visit Diagnosis Dysphagia, oropharyngeal phase (R13.12) Attention and concentration deficit following -- Frontal lobe and executive function deficit following -- Impact on safety and function Moderate aspiration risk   CHL IP TREATMENT RECOMMENDATION 03/05/2019 Treatment Recommendations Therapy as outlined in treatment plan below   Prognosis 03/05/2019 Prognosis for Safe Diet Advancement Good Barriers to Reach Goals -- Barriers/Prognosis Comment -- CHL IP DIET RECOMMENDATION 03/05/2019 SLP Diet Recommendations Dysphagia 1 (Puree) solids;Honey thick liquids Liquid Administration via  Cup;No straw Medication Administration Crushed with puree Compensations Slow rate;Small sips/bites;Minimize environmental distractions;Clear throat intermittently Postural Changes Seated upright at 90 degrees   CHL IP OTHER RECOMMENDATIONS 03/05/2019 Recommended Consults -- Oral Care Recommendations Oral care BID Other Recommendations Order thickener from pharmacy   CHL IP FOLLOW UP RECOMMENDATIONS 03/05/2019 Follow up Recommendations Skilled Nursing facility   [facility] IP FREQUENCY AND DURATION 03/05/2019 Speech Therapy Frequency (ACUTE ONLY) min 2x/week Treatment Duration 2 weeks      CHL IP ORAL PHASE 03/05/2019 Oral Phase Impaired Oral - Pudding Teaspoon -- Oral - Pudding Cup -- Oral - Honey Teaspoon -- Oral - Honey Cup -- Oral - Nectar Teaspoon -- Oral - Nectar Cup Left anterior bolus loss;Decreased bolus cohesion Oral - Nectar Straw -- Oral - Thin Teaspoon -- Oral - Thin Cup Left anterior bolus loss;Decreased bolus cohesion Oral - Thin Straw -- Oral - Puree -- Oral - Mech Soft -- Oral - Regular Delayed oral transit;Left pocketing in lateral sulci Oral - Multi-Consistency -- Oral - Pill -- Oral Phase - Comment --  CHL IP PHARYNGEAL PHASE 03/05/2019 Pharyngeal Phase Impaired Pharyngeal- Pudding Teaspoon -- Pharyngeal -- Pharyngeal- Pudding Cup -- Pharyngeal -- Pharyngeal- Honey Teaspoon -- Pharyngeal -- Pharyngeal- Honey Cup -- Pharyngeal -- Pharyngeal- Nectar Teaspoon -- Pharyngeal -- Pharyngeal- Nectar Cup Delayed swallow initiation-vallecula;Penetration/Aspiration during swallow Pharyngeal Material enters airway, remains ABOVE vocal cords and not ejected out Pharyngeal- Nectar Straw -- Pharyngeal -- Pharyngeal- Thin Teaspoon -- Pharyngeal -- Pharyngeal- Thin Cup Delayed swallow initiation-pyriform sinuses;Penetration/Aspiration during swallow Pharyngeal Material enters airway, remains ABOVE vocal cords and not ejected out Pharyngeal- Thin Straw -- Pharyngeal -- Pharyngeal- Puree -- Pharyngeal -- Pharyngeal- Mechanical  Soft -- Pharyngeal -- Pharyngeal- Regular WFL Pharyngeal -- Pharyngeal- Multi-consistency -- Pharyngeal -- Pharyngeal- Pill -- Pharyngeal -- Pharyngeal Comment --  CHL IP CERVICAL ESOPHAGEAL PHASE 03/05/2019 Cervical Esophageal Phase WFL Pudding Teaspoon -- Pudding Cup -- Honey Teaspoon -- Honey Cup -- Nectar Teaspoon -- Nectar Cup -- Nectar Straw -- Thin Teaspoon -- Thin Cup -- Thin Straw -- Puree -- Mechanical Soft -- Regular -- Multi-consistency -- Pill -- Cervical Esophageal Comment -- Royce Macadamia 03/05/2019, 4:53 PM  Breck Coons Litaker M.Ed Nurse, children's [phone] Office [phone]  Ct Head Code Stroke Wo Contrast  Result Date: 03/05/2019 CLINICAL DATA:  Code stroke. Initial evaluation for acute headache, aphasia. EXAM: CT HEAD WITHOUT CONTRAST TECHNIQUE: Contiguous axial images were obtained from the base of the skull through the vertex without intravenous contrast. COMPARISON:  Prior CT from 06/29/2011. FINDINGS: Brain: Age-related cerebral atrophy with mild chronic small vessel ischemic disease. No acute intracranial hemorrhage. No definite evidence for acute or vaulting large vessel territory infarct. No mass lesion, midline shift or mass effect. No hydrocephalus. No extra-axial fluid collection. Vascular: Focal hyperdensity seen involving a distal right MCA branch at the right sylvian fissure (series 2, image 17), indeterminate, and could reflect focal atherosclerotic calcification or possibly thrombus. No other hyperdense vessel. Insert siphon. Skull: Scalp soft tissues and calvarium within normal limits. Sinuses/Orbits: Globes and orbital soft tissues within normal limits. Paranasal sinuses and mastoids are clear. Other: None. ASPECTS [facility]([place] Stroke Program Early CT Score) - Ganglionic level infarction (caudate, lentiform nuclei, internal capsule, insula, M1-M3 cortex): 7 - Supraganglionic infarction (M4-M6 cortex): 3 Total score (0-10 with 10 being  normal): 10 IMPRESSION: 1. Focal hyperdensity involving distal right MCA branches at the right sylvian fissure, age indeterminate, and may reflect focal atherosclerotic calcification or possibly intraluminal thrombus. Further assessment with CTA suggested. 2. ASPECTS is 10. 3. No other acute intracranial abnormality. 4. Underlying age-related cerebral atrophy with mild chronic small vessel ischemic disease. Critical Value/emergent results were called by telephone at the time of interpretation on 03/04/2019 at approximately 8:00 Pm to Dr. Alona BeneJOSHUA LONG , who verbally acknowledged these results. Electronically Signed   By: Rise MuBenjamin  McClintock M.D.   On: 03/05/2019 18:55     The results of significant diagnostics from this hospitalization (including imaging, microbiology, ancillary and laboratory) are listed below for reference.     Microbiology: Recent Results (from the past 240 hour(s))  SARS Coronavirus 2 [facility](Hospital order, [facility]formed [facility]) Nasopharyngeal Nasopharyngeal Swab     Status: None   Collection Time: 03/04/19  8:10 PM   Specimen: Nasopharyngeal Swab  Result Value Ref Range Status   SARS Coronavirus 2 NEGATIVE NEGATIVE Final    Comment: (NOTE) If result is NEGATIVE SARS-CoV-2 target nucleic acids are NOT DETECTED. The SARS-CoV-2 RNA is generally detectable in upper and lower  respiratory specimens during the acute phase of infection. The lowest  concentration of SARS-CoV-2 viral copies this assay can detect is 250  copies / mL. A negative result does not preclude SARS-CoV-2 infection  and should not be used as the sole basis for treatment or other  patient management decisions.  A negative result may occur with  improper specimen collection / handling, submission of specimen other  than nasopharyngeal swab, presence of viral mutation(s) within the  areas targeted by this assay, and inadequate number of viral copies  (<250 copies / mL). A negative result must be  combined with clinical  observations, patient history, and epidemiological information. If result is POSITIVE SARS-CoV-2 target nucleic acids are DETECTED. The SARS-CoV-2 RNA is generally detectable in upper and lower  respiratory specimens dur ing the acute phase of infection.  Positive  results are indicative of active infection with SARS-CoV-2.  Clinical  correlation with patient history and other diagnostic information is  necessary to determine patient infection status.  Positive results do  not rule out bacterial infection or co-infection with other viruses. If result is PRESUMPTIVE POSTIVE SARS-CoV-2 nucleic acids MAY BE PRESENT.   A presumptive positive result was obtained on the  submitted specimen  and confirmed on repeat testing.  While 2019 novel coronavirus  (SARS-CoV-2) nucleic acids may be present in the submitted sample  additional confirmatory testing may be necessary for epidemiological  and / or clinical management purposes  to differentiate between  SARS-CoV-2 and other Sarbecovirus currently known to infect humans.  If clinically indicated additional testing with an alternate test  methodology [phone]) is advised. The SARS-CoV-2 RNA is generally  detectable in upper and lower respiratory sp ecimens during the acute  phase of infection. The expected result is Negative. Fact Sheet for Patients:  BoilerBrush.com.cy Fact Sheet for Healthcare Providers: https://pope.com/ This test is not yet approved or cleared by the [place] FDA and has been authorized for detection and/or diagnosis of SARS-CoV-2 by FDA under an Emergency Use Authorization (EUA).  This EUA will remain in effect (meaning this test can be used) for the duration of the COVID-19 declaration under Section 564(b)(1) of the Act, 21 U.S.C. section 360bbb-3(b)(1), unless the authorization is terminated or revoked sooner. Performed at [facility],  [address] [place], [place] [address]   MRSA PCR Screening     Status: None   Collection Time: 03/04/19 11:13 PM   Specimen: Nasal Mucosa; Nasopharyngeal  Result Value Ref Range Status   MRSA by PCR NEGATIVE NEGATIVE Final    Comment:        The GeneXpert MRSA Assay (FDA approved for NASAL specimens only), is one component of a comprehensive MRSA colonization surveillance program. It is not intended to diagnose MRSA infection nor to guide or monitor treatment for MRSA infections. Performed at [facility], [address] [place], [place] [address]   Culture, blood (routine x 2)     Status: None (Preliminary result)   Collection Time: 2019-03-30  7:09 AM   Specimen: BLOOD LEFT ARM  Result Value Ref Range Status   Specimen Description BLOOD LEFT ARM  Final   Special Requests   Final    BOTTLES DRAWN AEROBIC AND ANAEROBIC Blood Culture adequate volume Performed at [facility], [address] [place], [place] [address]    Culture PENDING  Incomplete   Report Status PENDING  Incomplete  SARS Coronavirus 2 [facility] order, Performed in [facility] hospital lab) Nasopharyngeal Nasopharyngeal Swab     Status: None   Collection Time: 03-30-19  7:09 AM   Specimen: Nasopharyngeal Swab  Result Value Ref Range Status   SARS Coronavirus 2 NEGATIVE NEGATIVE Final    Comment: (NOTE) If result is NEGATIVE SARS-CoV-2 target nucleic acids are NOT DETECTED. The SARS-CoV-2 RNA is generally detectable in upper and lower  respiratory specimens during the acute phase of infection. The lowest  concentration of SARS-CoV-2 viral copies this assay can detect is 250  copies / mL. A negative result does not preclude SARS-CoV-2 infection  and should not be used as the sole basis for treatment or other  patient management decisions.  A negative result may occur with  improper specimen collection / handling, submission of specimen other  than nasopharyngeal swab, presence of viral mutation(s) within  the  areas targeted by this assay, and inadequate number of viral copies  (<250 copies / mL). A negative result must be combined with clinical  observations, patient history, and epidemiological information. If result is POSITIVE SARS-CoV-2 target nucleic acids are DETECTED. The SARS-CoV-2 RNA is generally detectable in upper and lower  respiratory specimens dur ing the acute phase of infection.  Positive  results are indicative of active  infection with SARS-CoV-2.  Clinical  correlation with patient history and other diagnostic information is  necessary to determine patient infection status.  Positive results do  not rule out bacterial infection or co-infection with other viruses. If result is PRESUMPTIVE POSTIVE SARS-CoV-2 nucleic acids MAY BE PRESENT.   A presumptive positive result was obtained on the submitted specimen  and confirmed on repeat testing.  While 2019 novel coronavirus  (SARS-CoV-2) nucleic acids may be present in the submitted sample  additional confirmatory testing may be necessary for epidemiological  and / or clinical management purposes  to differentiate between  SARS-CoV-2 and other Sarbecovirus currently known to infect humans.  If clinically indicated additional testing with an alternate test  methodology [phone]) is advised. The SARS-CoV-2 RNA is generally  detectable in upper and lower respiratory sp ecimens during the acute  phase of infection. The expected result is Negative. Fact Sheet for Patients:  BoilerBrush.com.cy Fact Sheet for Healthcare Providers: https://pope.com/ This test is not yet approved or cleared by the [place] FDA and has been authorized for detection and/or diagnosis of SARS-CoV-2 by FDA under an Emergency Use Authorization (EUA).  This EUA will remain in effect (meaning this test can be used) for the duration of the COVID-19 declaration under Section 564(b)(1) of the Act, 21  U.S.C. section 360bbb-3(b)(1), unless the authorization is terminated or revoked sooner. Performed at [facility], [address], [place], [place] [address]   Culture, blood (routine x 2)     Status: None (Preliminary result)   Collection Time: 04/02/19  8:36 AM   Specimen: Blood  Result Value Ref Range Status   Specimen Description BLOOD RIGHT ARM  Final   Special Requests   Final    BOTTLES DRAWN AEROBIC AND ANAEROBIC Blood Culture adequate volume Performed at [facility], [address] [place], [place] [address]    Culture PENDING  Incomplete   Report Status PENDING  Incomplete     Labs: BNP (last 3 results) Recent Labs    04-02-2019 0836  BNP 510.0*   Basic Metabolic Panel: Recent Labs  Lab 03/07/19 0925 03/08/19 0603 03/09/19 0421 03/10/19 0443 02-Apr-2019 0836  NA 145 147* 147* 148* 152*  K 4.0 4.1 4.2 3.9 3.8  CL 118* 118* 117* 120* 122*  CO2 18* 23 20* 22 24  GLUCOSE 95 97 110* 96 128*  BUN 46* 43* 43* 41* 44*  CREATININE 1.11* 1.13* 1.29* 1.32* 1.36*  CALCIUM 9.1 9.0 9.0 8.5* 9.1   Liver Function Tests: No results for input(s): AST, ALT, ALKPHOS, BILITOT, PROT, ALBUMIN in the last 168 hours. No results for input(s): LIPASE, AMYLASE in the last 168 hours. No results for input(s): AMMONIA in the last 168 hours. CBC: Recent Labs  Lab 03/07/19 0925 03/08/19 0603 03/09/19 0421 03/10/19 0443 April 02, 2019 0836  WBC 6.9 7.9 8.8 7.1 7.4  NEUTROABS  --   --   --   --  5.8  HGB 10.1* 9.6* 10.1* 9.8* 10.6*  HCT 33.5* 31.8* 33.5* 32.7* 36.8  MCV 96.5 96.4 97.4 97.9 101.1*  PLT 185 188 187 173 221   Cardiac Enzymes: No results for input(s): CKTOTAL, CKMB, CKMBINDEX, TROPONINI in the last 168 hours. BNP: Invalid input(s): POCBNP CBG: No results for input(s): GLUCAP in the last 168 hours. D-Dimer No results for input(s): DDIMER in the last 72 hours. Hgb A1c No results for input(s): HGBA1C in the last 72 hours. Lipid Profile No results for input(s): CHOL,  HDL, LDLCALC, TRIG, CHOLHDL, LDLDIRECT  in the last 72 hours. Thyroid function studies No results for input(s): TSH, T4TOTAL, T3FREE, THYROIDAB in the last 72 hours.  Invalid input(s): FREET3 Anemia work up No results for input(s): VITAMINB12, FOLATE, FERRITIN, TIBC, IRON, RETICCTPCT in the last 72 hours. Urinalysis    Component Value Date/Time   COLORURINE YELLOW 03/07/2019 1241   APPEARANCEUR CLEAR 03/07/2019 1241   LABSPEC 1.011 03/07/2019 1241   PHURINE 5.0 03/07/2019 1241   GLUCOSEU NEGATIVE 03/07/2019 1241   HGBUR NEGATIVE 03/07/2019 1241   BILIRUBINUR NEGATIVE 03/07/2019 1241   KETONESUR NEGATIVE 03/07/2019 1241   PROTEINUR NEGATIVE 03/07/2019 1241   UROBILINOGEN 0.2 02/14/2010 0231   NITRITE NEGATIVE 03/07/2019 1241   LEUKOCYTESUR LARGE (A) 03/07/2019 1241   Sepsis Labs Invalid input(s): PROCALCITONIN,  WBC,  LACTICIDVEN Microbiology Recent Results (from the past 240 hour(s))  SARS Coronavirus 2 [facility] order, Performed in [facility] hospital lab) Nasopharyngeal Nasopharyngeal Swab     Status: None   Collection Time: 03/04/19  8:10 PM   Specimen: Nasopharyngeal Swab  Result Value Ref Range Status   SARS Coronavirus 2 NEGATIVE NEGATIVE Final    Comment: (NOTE) If result is NEGATIVE SARS-CoV-2 target nucleic acids are NOT DETECTED. The SARS-CoV-2 RNA is generally detectable in upper and lower  respiratory specimens during the acute phase of infection. The lowest  concentration of SARS-CoV-2 viral copies this assay can detect is 250  copies / mL. A negative result does not preclude SARS-CoV-2 infection  and should not be used as the sole basis for treatment or other  patient management decisions.  A negative result may occur with  improper specimen collection / handling, submission of specimen other  than nasopharyngeal swab, presence of viral mutation(s) within the  areas targeted by this assay, and inadequate number of viral copies  (<250 copies / mL). A negative  result must be combined with clinical  observations, patient history, and epidemiological information. If result is POSITIVE SARS-CoV-2 target nucleic acids are DETECTED. The SARS-CoV-2 RNA is generally detectable in upper and lower  respiratory specimens dur ing the acute phase of infection.  Positive  results are indicative of active infection with SARS-CoV-2.  Clinical  correlation with patient history and other diagnostic information is  necessary to determine patient infection status.  Positive results do  not rule out bacterial infection or co-infection with other viruses. If result is PRESUMPTIVE POSTIVE SARS-CoV-2 nucleic acids MAY BE PRESENT.   A presumptive positive result was obtained on the submitted specimen  and confirmed on repeat testing.  While 2019 novel coronavirus  (SARS-CoV-2) nucleic acids may be present in the submitted sample  additional confirmatory testing may be necessary for epidemiological  and / or clinical management purposes  to differentiate between  SARS-CoV-2 and other Sarbecovirus currently known to infect humans.  If clinically indicated additional testing with an alternate test  methodology [phone]) is advised. The SARS-CoV-2 RNA is generally  detectable in upper and lower respiratory sp ecimens during the acute  phase of infection. The expected result is Negative. Fact Sheet for Patients:  BoilerBrush.com.cy Fact Sheet for Healthcare Providers: https://pope.com/ This test is not yet approved or cleared by the [place] FDA and has been authorized for detection and/or diagnosis of SARS-CoV-2 by FDA under an Emergency Use Authorization (EUA).  This EUA will remain in effect (meaning this test can be used) for the duration of the COVID-19 declaration under Section 564(b)(1) of the Act, 21 U.S.C. section 360bbb-3(b)(1), unless the authorization is terminated or revoked  sooner. Performed at [facility], [address] [place], [place] [address]   MRSA PCR Screening     Status: None   Collection Time: 03/04/19 11:13 PM   Specimen: Nasal Mucosa; Nasopharyngeal  Result Value Ref Range Status   MRSA by PCR NEGATIVE NEGATIVE Final    Comment:        The GeneXpert MRSA Assay (FDA approved for NASAL specimens only), is one component of a comprehensive MRSA colonization surveillance program. It is not intended to diagnose MRSA infection nor to guide or monitor treatment for MRSA infections. Performed at [facility], [address] [place], [place] [address]   Culture, blood (routine x 2)     Status: None (Preliminary result)   Collection Time: 04-29-19  7:09 AM   Specimen: BLOOD LEFT ARM  Result Value Ref Range Status   Specimen Description BLOOD LEFT ARM  Final   Special Requests   Final    BOTTLES DRAWN AEROBIC AND ANAEROBIC Blood Culture adequate volume Performed at [facility], [address] [place], [place] [address]    Culture PENDING  Incomplete   Report Status PENDING  Incomplete  SARS Coronavirus 2 [facility](Hospital order, Performed [facility]) Nasopharyngeal Nasopharyngeal Swab     Status: None   Collection Time: 04-29-19  7:09 AM   Specimen: Nasopharyngeal Swab  Result Value Ref Range Status   SARS Coronavirus 2 NEGATIVE NEGATIVE Final    Comment: (NOTE) If result is NEGATIVE SARS-CoV-2 target nucleic acids are NOT DETECTED. The SARS-CoV-2 RNA is generally detectable in upper and lower  respiratory specimens during the acute phase of infection. The lowest  concentration of SARS-CoV-2 viral copies this assay can detect is 250  copies / mL. A negative result does not preclude SARS-CoV-2 infection  and should not be used as the sole basis for treatment or other  patient management decisions.  A negative result may occur with  improper specimen collection / handling, submission of specimen other  than nasopharyngeal swab, presence of viral  mutation(s) within the  areas targeted by this assay, and inadequate number of viral copies  (<250 copies / mL). A negative result must be combined with clinical  observations, patient history, and epidemiological information. If result is POSITIVE SARS-CoV-2 target nucleic acids are DETECTED. The SARS-CoV-2 RNA is generally detectable in upper and lower  respiratory specimens dur ing the acute phase of infection.  Positive  results are indicative of active infection with SARS-CoV-2.  Clinical  correlation with patient history and other diagnostic information is  necessary to determine patient infection status.  Positive results do  not rule out bacterial infection or co-infection with other viruses. If result is PRESUMPTIVE POSTIVE SARS-CoV-2 nucleic acids MAY BE PRESENT.   A presumptive positive result was obtained on the submitted specimen  and confirmed on repeat testing.  While 2019 novel coronavirus  (SARS-CoV-2) nucleic acids may be present in the submitted sample  additional confirmatory testing may be necessary for epidemiological  and / or clinical management purposes  to differentiate between  SARS-CoV-2 and other Sarbecovirus currently known to infect humans.  If clinically indicated additional testing with an alternate test  methodology [phone]([id number]) is advised. The SARS-CoV-2 RNA is generally  detectable in upper and lower respiratory sp ecimens during the acute  phase of infection. The expected result is Negative. Fact Sheet for Patients:  BoilerBrush.com.cyhttps://www.fda.gov/media/136312/download Fact Sheet for Healthcare Providers: https://pope.com/https://www.fda.gov/media/136313/download This test is not yet approved or cleared by the  [place] [employer] and has been authorized for detection and/or diagnosis of SARS-CoV-2 by FDA under an TEFL teacher (EUA).  This EUA will remain in effect (meaning this test can be used) for the duration of the COVID-19 declaration under Section 564(b)(1)  of the Act, 21 U.S.C. section 360bbb-3(b)(1), unless the authorization is terminated or revoked sooner. Performed at [facility], [address], [place], [place] [address]   Culture, blood (routine x 2)     Status: None (Preliminary result)   Collection Time: 18-Mar-2019  8:36 AM   Specimen: Blood  Result Value Ref Range Status   Specimen Description BLOOD RIGHT ARM  Final   Special Requests   Final    BOTTLES DRAWN AEROBIC AND ANAEROBIC Blood Culture adequate volume Performed at [facility], [address] [place], [place] [address]    Culture PENDING  Incomplete   Report Status PENDING  Incomplete     Time coordinating discharge: 35 minutes  SIGNED:   Erick Blinks, DO [facility] 2019/03/18, 4:00 PM  If 7PM-7AM, please contact night-coverage [email or web address] Password [email or web address]

## 2019-03-17 NOTE — ED Triage Notes (Signed)
Pt from [facility] Pt has only been at [facility] x 2 days, staff stated that she is normally A&O. Pt's O2 72% RA on EMS arrival, pt placed on 6LPM via N.[place].

## 2019-03-17 NOTE — ED Notes (Signed)
Bipap removed at this time.  Adm 6L O2 [place] at this time sats 100%  Daughter at bedside

## 2019-03-17 NOTE — ED Provider Notes (Signed)
MSE was initiated and I personally evaluated the patient and placed orders (if any) at  7:00 AM on Apr 06, 2019.  The patient appears stable so that the remainder of the MSE may be completed by another provider.  Patient arrived by ambulance from [place] home.  She was found by staff noted to be having difficulty breathing.  EMS reports initial oxygen saturation on room air 72%.  Oxygen saturation improved to 96% with supplemental oxygen.  On arrival, she is having labored breathing with very little air movement but does not appear to be in severe distress.   Delora Fuel, MD 20/35/59 0700

## 2019-03-17 NOTE — ED Notes (Signed)
CRITICAL VALUE ALERT  Critical Value: troponin 613  Date & Time Notied: 0925  Provider Notified: Long  Orders Received/Actions taken: lasix

## 2019-03-17 NOTE — H&P (Addendum)
History and Physical    Stefanie [name] ZOX:[id number] DOB: 12/30/1932 DOA: 03/05/19  PCP: Benita StabileHall, John Z, MD   Patient coming from: [facility]   Chief Complaint: Dyspnea/hypoxemia  HPI: Stefanie BlueRosa I Harvill is a 85 y.o. 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  She was started on 6 L nasal cannula and transported via EMS to the ED.  She has been started on BiPAP on account of her distress and daughter is currently at bedside stating that she would not want intubation.  No recent coughing, fever, chills, orthopnea, or lower extremity edema noted.  She denies any chest pain.  History is difficult to obtain from patient and is mostly obtained from daughter at bedside.   ED Course: Vital signs are stable and patient is afebrile.  Laboratory data with sodium of 152, BUN 44, and creatinine 1.36-with baseline around 1.1.  Hemoglobin is 10.6 and is near her usual baseline.  Chest x-ray demonstrates cardiomegaly with central pulmonary vascular congestion as well as pulmonary edema and questionable bilateral pleural effusions.  Her BNP is 510 and initial high-sensitivity troponin is 613.  She is also had imaging of her pelvis with no acute findings due to hip pain.  No recent trauma or falls noted.  She has been given 60 mg of IV Lasix with no output noted as of yet.   Review of Systems: Cannot be fully obtained given patient condition.  Past Medical History:  Diagnosis Date   Asthma    Breast mass    Left   Chronic kidney disease    Borderline with serum creatinine at the upper limit of normal in 10/2011   Chronic low back pain    Diverticular  disease    acute diverticulitis in 02/2010   Dyspnea on exertion 2010   Gastroesophageal reflux disease    hiatal hernia   Hyperlipidemia    Lipid profile 10/2011:240, 132, 52, 162   Hypertension    Lab  10/2011: Normal CMet except GFR of 50, normal microalbumin, normal CBC, uric acid of 7.9, A1c of 6.1, normal urinalysis, low vitamin D; negative stress nuclear study in 2008   Obesity    Osteoporosis    Paroxysmal atrial fibrillation (HCC)    Sleep apnea 2010   Severe   Syncope 06/2011   Urinary incontinence    Interstitial cystitis    Past Surgical History:  Procedure Laterality Date   CATARACT EXTRACTION     CHOLECYSTECTOMY  1993   CHOLECYSTECTOMY  1993   COLONOSCOPY  2008   Negative screening study   DILATION AND CURETTAGE OF UTERUS  1986   LUMBAR LAMINECTOMY  1992   ORTHOPEDIC SURGERY     TUBAL LIGATION       reports that she has never smoked. She has never used smokeless tobacco. She reports that she does not drink alcohol or use drugs.  Allergies  Allergen Reactions   Ace Inhibitors Cough    Occurred with Monopril   Ciprofloxacin     Sick   Hydrocodone-Acetaminophen Nausea Only   Lipitor [Atorvastatin]     Tired, weak muscles   Monopril [Fosinopril Sodium]     Cough    Morphine  And Related    Nitrofurantoin Monohyd Macro Nausea Only    Also skin rash, edema    Percocet [Oxycodone-Acetaminophen] Nausea Only   Statins Other (See Comments)    Myalgias; prior exposure to pravastatin, atorvastatin, lovastatin, Lescol, simvastatin and ezetimibe   Flagyl [Metronidazole] Nausea Only   Phenazopyridine Rash    Family History  Problem Relation Age of Onset   Heart attack Mother    Asthma Father    Diabetes Father    Heart failure Father    Colitis Sister    Heart failure Brother    Pancreatic cancer Brother    Diabetes Sister    Transient ischemic attack Sister     Prior to Admission medications   Medication Sig  Start Date End Date Taking? Authorizing Provider  allopurinol (ZYLOPRIM) 100 MG tablet Take 100 mg by mouth 2 (two) times daily.   Yes [provider]  apixaban (ELIQUIS) 5 MG TABS tablet Take 1 tablet (5 mg total) by mouth 2 (two) times daily. 03/10/19  Yes Donzetta Starch, NP  Balsam [place]-Castor Oil (VENELEX) OINT Apply to bilateral buttocks and sacrum qshift & prn for prevention. Every shift   Yes [provider]  bumetanide (BUMEX) 1 MG tablet Take 1 mg by mouth daily.     Yes [provider]  calcium carbonate (TUMS - DOSED IN MG ELEMENTAL CALCIUM) 500 MG chewable tablet Chew 1 tablet by mouth 2 (two) times daily as needed for indigestion or heartburn.    Yes [provider]  cholecalciferol (VITAMIN D) 25 MCG tablet Take 1 tablet (1,000 Units total) by mouth daily. 03/11/19  Yes Donzetta Starch, NP  colchicine 0.6 MG tablet Take 1 tablet (0.6 mg total) by mouth 2 (two) times daily. 03/10/19  Yes Donzetta Starch, NP  colesevelam (WELCHOL) 625 MG tablet Take 1,875 mg by mouth 2 (two) times daily with a meal.    Yes [provider]  gabapentin (NEURONTIN) 100 MG capsule Take 1 capsule (100 mg total) by mouth at bedtime. 03/10/19  Yes Donzetta Starch, NP  Maltodextrin-Xanthan Gum (RESOURCE THICKENUP CLEAR) POWD Take 120 g by mouth as needed (for nectar thick liquids). 03/10/19  Yes Donzetta Starch, NP  metoprolol tartrate (LOPRESSOR) 50 MG tablet Take 1 tablet (50 mg total) by mouth 2 (two) times daily. 03/10/19  Yes Donzetta Starch, NP  NON FORMULARY Diet - Dysphagia 2 with Nectar thick liquids   Yes [provider]  NON FORMULARY Cpap during sleep - may bring from home and use home settings Twice A Day   Yes [provider]  ondansetron (ZOFRAN) 4 MG tablet Take 4 mg by mouth every 6 (six) hours as needed for nausea or vomiting.   Yes [provider]  Ostomy Supplies (SKIN PREP WIPES) MISC Apply skin prep to bilateral heels qshift for  prevention. Every Shift   Yes [provider]  pantoprazole (PROTONIX) 40 MG tablet Take 40 mg by mouth daily.   Yes [provider]  polyethylene glycol (MIRALAX / GLYCOLAX) packet Take 17 g by mouth daily as needed for mild constipation.    Yes [provider]    Physical Exam: Vitals:   04-06-2019 0730 04/06/2019 0900 April 06, 2019 0930 2019-04-06 1003  BP: 103/77 101/64 123/85   Pulse:   96 87  Resp:  (!) 21  16  SpO2:   97%   Weight:      Height:  Constitutional: NAD, currently on BiPAP FiO2 50% Vitals:   April 05, 2019 0730 04-05-2019 0900 2019-04-05 0930 04/05/19 1003  BP: 103/77 101/64 123/85   Pulse:   96 87  Resp:  (!) 21  16  SpO2:   97%   Weight:      Height:       Eyes: lids and conjunctivae normal ENMT: Mucous membranes are moist.  Neck: normal, supple Respiratory: Rales noted bilaterally Cardiovascular: Regular rate and rhythm, no murmurs. No extremity edema. Abdomen: no tenderness, no distention. Bowel sounds positive.  Musculoskeletal:  No joint deformity upper and lower extremities.   Skin: no rashes, lesions, ulcers.  Psychiatric: Difficult to assess  Labs on Admission: I have personally reviewed following labs and imaging studies  CBC: Recent Labs  Lab 03/07/19 0925 03/08/19 0603 03/09/19 0421 03/10/19 0443 2019-04-05 0836  WBC 6.9 7.9 8.8 7.1 7.4  NEUTROABS  --   --   --   --  5.8  HGB 10.1* 9.6* 10.1* 9.8* 10.6*  HCT 33.5* 31.8* 33.5* 32.7* 36.8  MCV 96.5 96.4 97.4 97.9 101.1*  PLT 185 188 187 173 221   Basic Metabolic Panel: Recent Labs  Lab 03/07/19 0925 03/08/19 0603 03/09/19 0421 03/10/19 0443 2019/04/05 0836  NA 145 147* 147* 148* 152*  K 4.0 4.1 4.2 3.9 3.8  CL 118* 118* 117* 120* 122*  CO2 18* 23 20* 22 24  GLUCOSE 95 97 110* 96 128*  BUN 46* 43* 43* 41* 44*  CREATININE 1.11* 1.13* 1.29* 1.32* 1.36*  CALCIUM 9.1 9.0 9.0 8.5* 9.1   GFR: Estimated Creatinine Clearance: 34.8 mL/min (A) (by C-G formula based on  SCr of 1.36 mg/dL (H)). Liver Function Tests: No results for input(s): AST, ALT, ALKPHOS, BILITOT, PROT, ALBUMIN in the last 168 hours. No results for input(s): LIPASE, AMYLASE in the last 168 hours. No results for input(s): AMMONIA in the last 168 hours. Coagulation Profile: No results for input(s): INR, PROTIME in the last 168 hours. Cardiac Enzymes: No results for input(s): CKTOTAL, CKMB, CKMBINDEX, TROPONINI in the last 168 hours. BNP (last 3 results) No results for input(s): PROBNP in the last 8760 hours. HbA1C: No results for input(s): HGBA1C in the last 72 hours. CBG: No results for input(s): GLUCAP in the last 168 hours. Lipid Profile: No results for input(s): CHOL, HDL, LDLCALC, TRIG, CHOLHDL, LDLDIRECT in the last 72 hours. Thyroid Function Tests: No results for input(s): TSH, T4TOTAL, FREET4, T3FREE, THYROIDAB in the last 72 hours. Anemia Panel: No results for input(s): VITAMINB12, FOLATE, FERRITIN, TIBC, IRON, RETICCTPCT in the last 72 hours. Urine analysis:    Component Value Date/Time   COLORURINE YELLOW 03/07/2019 1241   APPEARANCEUR CLEAR 03/07/2019 1241   LABSPEC 1.011 03/07/2019 1241   PHURINE 5.0 03/07/2019 1241   GLUCOSEU NEGATIVE 03/07/2019 1241   HGBUR NEGATIVE 03/07/2019 1241   BILIRUBINUR NEGATIVE 03/07/2019 1241   KETONESUR NEGATIVE 03/07/2019 1241   PROTEINUR NEGATIVE 03/07/2019 1241   UROBILINOGEN 0.2 02/14/2010 0231   NITRITE NEGATIVE 03/07/2019 1241   LEUKOCYTESUR LARGE (A) 03/07/2019 1241    Radiological Exams on Admission: Dg Pelvis Portable  Result Date: 04-05-2019 CLINICAL DATA:  Right hip pain.  No known injury. EXAM: PORTABLE PELVIS 1-2 VIEWS COMPARISON:  None. FINDINGS: There is no evidence of pelvic fracture or diastasis. No pelvic bone lesions are seen. IMPRESSION: Negative. Electronically Signed   By: Lupita Raider M.D.   On: 04/05/19 09:50   Dg Chest Port 1 View  Result Date: 05-Apr-2019  CLINICAL DATA:  Shortness of breath. EXAM:  PORTABLE CHEST 1 VIEW COMPARISON:  Radiograph of March 09, 2019. FINDINGS: Stable cardiomegaly. Central pulmonary vascular congestion is noted. Bibasilar opacities are noted concerning for edema with small pleural effusions. No pneumothorax is noted. Bony thorax is unremarkable. IMPRESSION: Stable cardiomegaly with central pulmonary vascular congestion is noted, with bibasilar opacities concerning pulmonary edema. Small pleural effusions may be present. Electronically Signed   By: Lupita Raider M.D.   On: 2019-04-06 07:44   Dg Swallowing Func-speech Pathology  Result Date: 03/10/2019 Objective Swallowing Evaluation: Type of Study: MBS-Modified Barium Swallow Study  Patient Details Name: RAYME LOPRESTO MRN: [id number] Date of Birth: 04-03-1933 Today's Date: 03/10/2019 Time: SLP Start Time (ACUTE ONLY): 1319 -SLP Stop Time (ACUTE ONLY): 1340 SLP Time Calculation (min) (ACUTE ONLY): 21 min Past Medical History: Past Medical History: Diagnosis Date  Asthma   Breast mass   Left  Chronic kidney disease   Borderline with serum creatinine at the upper limit of normal in 10/2011  Chronic low back pain   Diverticular disease   acute diverticulitis in 02/2010  Dyspnea on exertion 2010  Gastroesophageal reflux disease   hiatal hernia  Hyperlipidemia   Lipid profile 10/2011:240, 132, 52, 162  Hypertension   Lab  10/2011: Normal CMet except GFR of 50, normal microalbumin, normal CBC, uric acid of 7.9, A1c of 6.1, normal urinalysis, low vitamin D; negative stress nuclear study in 2008  Obesity   Osteoporosis   Paroxysmal atrial fibrillation (HCC)   Sleep apnea 2010  Severe  Syncope 06/2011  Urinary incontinence   Interstitial cystitis Past Surgical History: Past Surgical History: Procedure Laterality Date  CATARACT EXTRACTION    CHOLECYSTECTOMY  1993  CHOLECYSTECTOMY  1993  COLONOSCOPY  2008  Negative screening study  DILATION AND CURETTAGE OF UTERUS  1986  LUMBAR LAMINECTOMY  1992  ORTHOPEDIC SURGERY     TUBAL LIGATION   HPI: 85 y.o. female admitted for R CVA, presenting with suspected dysarthria and aphasia. On 8/19 she began having difficulty with understanding as well as with finding words. Reported mild SOB. On 8/20 13:12, she was alert and oriented, able to give a clear and coherent history, able to name simple objects and repeat without difficulty, and did not have evidence of neglect. Left facial weakness and mild strengh weakness in left arm. Nursing reports facial droop has worsened. She has a history of hypertension, hyperlipidemia, atrial fibrillation not on anticoagulation. Barium esophagram 2015 with Diffuse age-related impairment of esophageal motility, Mild laryngeal penetration without aspiration, GERD. Repeat MBS today for possible upgrade in solids and/or liquids.  Subjective: requires cueing, daughter at bedside Assessment / Plan / Recommendation CHL IP CLINICAL IMPRESSIONS 03/10/2019 Clinical Impression Pt's swallow function has improved mildy from prior study. Increased oral control and transit without significant left buccal pocketing with continued left facial leakage. Initiation of swallow was mostly timely. Thin barium penetrated to cords and silently aspirated during the swallow. Due to obstruction of shoulder could not view swallow with chin tuck. Suspect airway intrusion as pt produced reflexive throat clear x 2. Pt able to upgrade to nectar consistency and recommend continue Dys 2 with further management at SNF for texture upgrade. Pt being discharged today to SNF. Recommend return for repeat MBS in 4 weeks for potential improvement.    SLP Visit Diagnosis Dysphagia, oropharyngeal phase (R13.12) Attention and concentration deficit following -- Frontal lobe and executive function deficit following -- Impact on safety and function Moderate aspiration risk  CHL IP TREATMENT RECOMMENDATION 03/10/2019 Treatment Recommendations Therapy as outlined in treatment plan below   Prognosis 03/10/2019  Prognosis for Safe Diet Advancement Good Barriers to Reach Goals -- Barriers/Prognosis Comment -- CHL IP DIET RECOMMENDATION 03/10/2019 SLP Diet Recommendations Nectar thick liquid;Dysphagia 2 (Fine chop) solids Liquid Administration via Cup Medication Administration Crushed with puree Compensations Lingual sweep for clearance of pocketing;Other (Comment) Postural Changes Seated upright at 90 degrees   CHL IP OTHER RECOMMENDATIONS 03/10/2019 Recommended Consults -- Oral Care Recommendations Oral care BID Other Recommendations --   CHL IP FOLLOW UP RECOMMENDATIONS 03/10/2019 Follow up [facility] D[facility] 03/10/2019 Speech Therapy Frequency (ACUTE ONLY) min 2x/week Treatment Duration 2 weeks      CHL IP ORAL PHASE 03/10/2019 Oral Phase Impaired Oral - Pudding Teaspoon -- Oral - Pudding Cup -- Oral - Honey Teaspoon -- Oral - Honey Cup -- Oral - Nectar Teaspoon -- Oral - Nectar Cup Lingual/palatal residue;Left anterior bolus loss Oral - Nectar Straw -- Oral - Thin Teaspoon -- Oral - Thin Cup WFL Oral - Thin Straw -- Oral - Puree -- Oral - Mech Soft -- Oral - Regular Delayed oral transit Oral - Multi-Consistency -- Oral - Pill -- Oral Phase - Comment --  CHL IP PHARYNGEAL PHASE 03/10/2019 Pharyngeal Phase Impaired Pharyngeal- Pudding Teaspoon -- Pharyngeal -- Pharyngeal- Pudding Cup -- Pharyngeal -- Pharyngeal- Honey Teaspoon -- Pharyngeal -- Pharyngeal- Honey Cup Delayed swallow initiation-vallecula Pharyngeal -- Pharyngeal- Nectar Teaspoon -- Pharyngeal -- Pharyngeal- Nectar Cup Penetration/Aspiration during swallow Pharyngeal Material enters airway, remains ABOVE vocal cords then ejected out Pharyngeal- Nectar Straw -- Pharyngeal -- Pharyngeal- Thin Teaspoon -- Pharyngeal -- Pharyngeal- Thin Cup Penetration/Aspiration during swallow Pharyngeal Material enters airway, passes BELOW cords without attempt by patient to eject out (silent aspiration) Pharyngeal- Thin Straw --  Pharyngeal -- Pharyngeal- Puree -- Pharyngeal -- Pharyngeal- Mechanical Soft -- Pharyngeal -- Pharyngeal- Regular WFL Pharyngeal -- Pharyngeal- Multi-consistency -- Pharyngeal -- Pharyngeal- Pill -- Pharyngeal -- Pharyngeal Comment --  CHL IP CERVICAL ESOPHAGEAL PHASE 03/10/2019 Cervical Esophageal Phase WFL Pudding Teaspoon -- Pudding Cup -- Honey Teaspoon -- Honey Cup -- Nectar Teaspoon -- Nectar Cup -- Nectar Straw -- Thin Teaspoon -- Thin Cup -- Thin Straw -- Puree -- Mechanical Soft -- Regular -- Multi-consistency -- Pill -- Cervical Esophageal Comment -- Royce MacadamiaLitaker, Lisa Willis 03/10/2019, 5:31 PM    Breck CoonsLisa Willis Litaker M.Ed Sports administratorCCC-SLP Speech-Language Pathologist Pager [phone] Office [phone]            EKG: Independently reviewed. SR 99bpm. LAD; no acute signs of ischemia.  Assessment/Plan Active Problems:   Acute diastolic (congestive) heart failure (HCC)    Acute hypoxemic respiratory failure secondary to pulmonary edema from acute on chronic diastolic heart failure decompensation -Maintain on BiPAP support for now and avoid intubation per patient wishes -Maintain on IV diuresis with Lasix 40 mg IV twice daily -Monitor daily weights and strict I's and O's -Monitor blood pressures carefully  Troponin elevation likely secondary to above -Continue to trend and discuss with cardiology pending trend -Patient and family state that they would not want any aggressive cardiac interventions to include stress test or catheterization -Functionally, patient is wheelchair-bound -Avoid 2D echocardiogram for now given recent echocardiogram 8/20 and no desire to pursue aggressive cardiac care.  Hypernatremia -Monitor carefully with diuresis and repeat labs in a.m.  Recent right MCA embolic CVA -Likely related to PAF -Maintain on Eliquis 5 mg twice daily -Dysphagia 2 diet with nectar  thickened fluids -Likely overall poor prognosis and will need palliative care consultation for end-of-life  care  PAF -Maintain on half dose of metoprolol and monitor carefully on telemetry -Maintain on Eliquis for anticoagulation  CKD stage III-stable -Monitor and repeat labs in a.m. -Monitor I's and O's and daily weights  Hypertension -Hold home Bumex and cut metoprolol dose to half with soft blood pressure readings -Monitor carefully on IV Lasix twice daily  Dyslipidemia -Maintain on WelChol  Chronic gout -Maintain on allopurinol  GERD -PPI   DVT prophylaxis: Eliquis Code Status: DNI Family Communication: Daughter at bedside Disposition Plan:Admit for diuresis/Palliative care consultation Consults called:Palliative Admission status: Inpatient, SDU   Pamalee Marcoe Hoover Brunette DO [facility] Pager [phone]  If 7PM-7AM, please contact night-coverage [email or web address] Password [email or web address]  2019-03-31, 11:00 AM

## 2019-03-17 NOTE — ED Notes (Signed)
ED TO INPATIENT HANDOFF REPORT  ED Nurse Name and Phone #: Gar GibbonRobin  S Name/Age/Gender Stefanie [name] 85 y.o. female Room/Bed: APA02/APA02  Code Status   Code Status: Prior  Home/SNF/Other Rehab Patient oriented to: self Is this baseline? Yes   Triage Complete: Triage complete  Chief Complaint RESP DISTRESS  [facility] Note [facility], staff stated that she is normally A&O. Pt's O2 72% RA on EMS arrival, pt placed on 6LPM via [place]    Allergies Allergies  Allergen Reactions  . Ace Inhibitors Cough    Occurred with Monopril  . Ciprofloxacin     Sick  . Hydrocodone-Acetaminophen Nausea Only  . Lipitor [Atorvastatin]     Tired, weak muscles  . Monopril [Fosinopril Sodium]     Cough   . Morphine And Related   . Nitrofurantoin Monohyd Macro Nausea Only    Also skin rash, edema   . Percocet [Oxycodone-Acetaminophen] Nausea Only  . Statins Other (See Comments)    Myalgias; prior exposure to pravastatin, atorvastatin, lovastatin, Lescol, simvastatin and ezetimibe  . Flagyl [Metronidazole] Nausea Only  . Phenazopyridine Rash    Level of Care/Admitting Diagnosis ED Disposition    ED Disposition Condition Comment   Admit  [facility] [[id number]]  Level of Care: Stepdown [14]  Covid Evaluation: N/A  Diagnosis: Acute diastolic (congestive) heart failure [facility](HCC) [[id number]][[id number]]  Admitting Physician: Erick BlinksSHAH, PRATIK D [[id number]]  Attending Physician: Erick BlinksSHAH, PRATIK D [[id number]]  Estimated length of stay: past midnight tomorrow  Certification:: I certify this patient will need inpatient services for at least 2 midnights  PT Class (Do Not Modify): Inpatient [101]  PT Acc Code (Do Not Modify): Private [1]       B Medical/Surgery History Past Medical History:  Diagnosis Date  . Asthma   . Breast mass    Left  . Chronic kidney disease    Borderline with serum creatinine at the upper limit of normal in 10/2011  . Chronic low back  pain   . Diverticular disease    acute diverticulitis in 02/2010  . Dyspnea on exertion 2010  . Gastroesophageal reflux disease    hiatal hernia  . Hyperlipidemia    Lipid profile 10/2011:240, 132, 52, 162  . Hypertension    Lab  10/2011: Normal CMet except GFR of 50, normal microalbumin, normal CBC, uric acid of 7.9, A1c of 6.1, normal urinalysis, low vitamin D; negative stress nuclear study in 2008  . Obesity   . Osteoporosis   . Paroxysmal atrial fibrillation (HCC)   . Sleep apnea 2010   Severe  . Syncope 06/2011  . Urinary incontinence    Interstitial cystitis   Past Surgical History:  Procedure Laterality Date  . CATARACT EXTRACTION    . CHOLECYSTECTOMY  1993  . CHOLECYSTECTOMY  1993  . COLONOSCOPY  2008   Negative screening study  . DILATION AND CURETTAGE OF UTERUS  1986  . LUMBAR LAMINECTOMY  1992  . ORTHOPEDIC SURGERY    . TUBAL LIGATION       A IV Location/Drains/Wounds Patient Lines/Drains/Airways Status   Active Line/Drains/Airways    Name:   Placement date:   Placement time:   Site:   Days:   Peripheral IV 05-16-19 Left Antecubital   05-16-19    0656    Antecubital   less than 1   Peripheral IV 05-16-19 Left Forearm   05-16-19    0708  Forearm   less than 1   External Urinary Catheter   03/05/19    2019    -   7          Intake/Output Last 24 hours No intake or output data in the 24 hours ending 03/29/2019 1114  Labs/Imaging Results for orders placed or performed during the hospital encounter of 29-Mar-2019 (from the past 48 hour(s))  Blood gas, arterial     Status: Abnormal   Collection Time: 2019/03/29  7:00 AM  Result Value Ref Range   FIO2 44.00    pH, Arterial 7.284 (L) 7.350 - 7.450   pCO2 arterial 47.1 32.0 - 48.0 mmHg   pO2, Arterial 146 (H) 83.0 - 108.0 mmHg   Bicarbonate 20.7 20.0 - 28.0 mmol/L   Acid-base deficit 4.0 (H) 0.0 - 2.0 mmol/L   O2 Saturation 98.8 %   Patient temperature 37.0    Allens test (pass/fail) BRACHIAL ARTERY (A) PASS     Comment: Performed at [facility], [address], [place], [place] [address]  Culture, blood (routine x 2)     Status: None (Preliminary result)   Collection Time: 03/29/2019  7:09 AM   Specimen: BLOOD LEFT ARM  Result Value Ref Range   Specimen Description BLOOD LEFT ARM    Special Requests      BOTTLES DRAWN AEROBIC AND ANAEROBIC Blood Culture adequate volume Performed at [facility], [address] [place], [place] [address]    Culture PENDING    Report Status PENDING   SARS Coronavirus 2 [facility] order, Performed in [facility] hospital lab) Nasopharyngeal Nasopharyngeal Swab     Status: None   Collection Time: 2019/03/29  7:09 AM   Specimen: Nasopharyngeal Swab  Result Value Ref Range   SARS Coronavirus 2 NEGATIVE NEGATIVE    Comment: (NOTE) If result is NEGATIVE SARS-CoV-2 target nucleic acids are NOT DETECTED. The SARS-CoV-2 RNA is generally detectable in upper and lower  respiratory specimens during the acute phase of infection. The lowest  concentration of SARS-CoV-2 viral copies this assay can detect is 250  copies / mL. A negative result does not preclude SARS-CoV-2 infection  and should not be used as the sole basis for treatment or other  patient management decisions.  A negative result may occur with  improper specimen collection / handling, submission of specimen other  than nasopharyngeal swab, presence of viral mutation(s) within the  areas targeted by this assay, and inadequate number of viral copies  (<250 copies / mL). A negative result must be combined with clinical  observations, patient history, and epidemiological information. If result is POSITIVE SARS-CoV-2 target nucleic acids are DETECTED. The SARS-CoV-2 RNA is generally detectable in upper and lower  respiratory specimens dur ing the acute phase of infection.  Positive  results are indicative of active infection with SARS-CoV-2.  Clinical  correlation with patient history and other diagnostic  information is  necessary to determine patient infection status.  Positive results do  not rule out bacterial infection or co-infection with other viruses. If result is PRESUMPTIVE POSTIVE SARS-CoV-2 nucleic acids MAY BE PRESENT.   A presumptive positive result was obtained on the submitted specimen  and confirmed on repeat testing.  While 2019 novel coronavirus  (SARS-CoV-2) nucleic acids may be present in the submitted sample  additional confirmatory testing may be necessary for epidemiological  and / or clinical management purposes  to differentiate between  SARS-CoV-2 and other Sarbecovirus currently known to infect humans.  If clinically  indicated additional testing with an alternate test  methodology [phone]) is advised. The SARS-CoV-2 RNA is generally  detectable in upper and lower respiratory sp ecimens during the acute  phase of infection. The expected result is Negative. Fact Sheet for Patients:  BoilerBrush.com.cy Fact Sheet for Healthcare Providers: https://pope.com/ This test is not yet approved or cleared by the [place] FDA and has been authorized for detection and/or diagnosis of SARS-CoV-2 by FDA under an Emergency Use Authorization (EUA).  This EUA will remain in effect (meaning this test can be used) for the duration of the COVID-19 declaration under Section 564(b)(1) of the Act, 21 U.S.C. section 360bbb-3(b)(1), unless the authorization is terminated or revoked sooner. Performed at [facility], [address] [place], [place] [address]   Basic metabolic panel     Status: Abnormal   Collection Time: Mar 16, 2019  8:36 AM  Result Value Ref Range   Sodium 152 (H) 135 - 145 mmol/L   Potassium 3.8 3.5 - 5.1 mmol/L   Chloride 122 (H) 98 - 111 mmol/L   CO2 24 22 - 32 mmol/L   Glucose, Bld 128 (H) 70 - 99 mg/dL   BUN 44 (H) 8 - 23 mg/dL   Creatinine, Ser 6.22 (H) 0.44 - 1.00 mg/dL   Calcium 9.1 8.9 - 63.3 mg/dL    GFR calc non Af Amer 36 (L) >60 mL/min   GFR calc Af Amer 41 (L) >60 mL/min   Anion gap 6 5 - 15    Comment: Performed at [facility], [address] [place], [place] [address]  Brain natriuretic peptide     Status: Abnormal   Collection Time: 2019/03/16  8:36 AM  Result Value Ref Range   B Natriuretic Peptide 510.0 (H) 0.0 - 100.0 pg/mL    Comment: Performed at [facility], [address], [place], [place] [address]  Troponin I (High Sensitivity)     Status: Abnormal   Collection Time: 03/16/2019  8:36 AM  Result Value Ref Range   Troponin I (High Sensitivity) 613 (HH) <18 ng/L    Comment: CRITICAL RESULT CALLED TO, READ BACK BY AND VERIFIED WITH: Lekeshia Kram,R AT 9:25AM ON 03-16-2019 BY FESTERMAN,C (NOTE) Elevated high sensitivity troponin I (hsTnI) values and significant  changes across serial measurements may suggest ACS but many other  chronic and acute conditions are known to elevate hsTnI results.  Refer to the Links section for chest pain algorithms and additional  guidance. Performed at [facility], [address], [place], [place] [address]   CBC with Differential     Status: Abnormal   Collection Time: 16-Mar-2019  8:36 AM  Result Value Ref Range   WBC 7.4 4.0 - 10.5 K/uL   RBC 3.64 (L) 3.87 - 5.11 MIL/uL   Hemoglobin 10.6 (L) 12.0 - 15.0 g/dL   HCT 28.7 68.1 - 15.7 %   MCV 101.1 (H) 80.0 - 100.0 fL   MCH 29.1 26.0 - 34.0 pg   MCHC 28.8 (L) 30.0 - 36.0 g/dL   RDW 26.2 (H) 03.5 - 59.7 %   Platelets 221 150 - 400 K/uL   nRBC 0.0 0.0 - 0.2 %   Neutrophils Relative % 78 %   Neutro Abs 5.8 1.7 - 7.7 K/uL   Lymphocytes Relative 11 %   Lymphs Abs 0.8 0.7 - 4.0 K/uL   Monocytes Relative 8 %   Monocytes Absolute 0.6 0.1 - 1.0 K/uL   Eosinophils Relative 2 %   Eosinophils Absolute 0.2 0.0 - 0.5  K/uL   Basophils Relative 0 %   Basophils Absolute 0.0 0.0 - 0.1 K/uL   Immature Granulocytes 1 %   Abs Immature Granulocytes 0.04 0.00 - 0.07 K/uL    Comment: Performed at [facility], [address] [place], [place] [address]  Lactic acid, plasma     Status: None   Collection Time: 2019-04-01  8:36 AM  Result Value Ref Range   Lactic Acid, Venous 1.8 0.5 - 1.9 mmol/L    Comment: Performed at [facility], [address], [place], [place] [address]  Culture, blood (routine x 2)     Status: None (Preliminary result)   Collection Time: 04-01-2019  8:36 AM   Specimen: Blood  Result Value Ref Range   Specimen Description BLOOD RIGHT ARM    Special Requests      BOTTLES DRAWN AEROBIC AND ANAEROBIC Blood Culture adequate volume Performed at [facility], [address] [place], [place] [address]    Culture PENDING    Report Status PENDING    Dg Pelvis Portable  Result Date: 2019/04/01 CLINICAL DATA:  Right hip pain.  No known injury. EXAM: PORTABLE PELVIS 1-2 VIEWS COMPARISON:  None. FINDINGS: There is no evidence of pelvic fracture or diastasis. No pelvic bone lesions are seen. IMPRESSION: Negative. Electronically Signed   By: Lupita Raider M.D.   On: 04/01/19 09:50   Dg Chest Port 1 View  Result Date: 2019-04-01 CLINICAL DATA:  Shortness of breath. EXAM: PORTABLE CHEST 1 VIEW COMPARISON:  Radiograph of March 09, 2019. FINDINGS: Stable cardiomegaly. Central pulmonary vascular congestion is noted. Bibasilar opacities are noted concerning for edema with small pleural effusions. No pneumothorax is noted. Bony thorax is unremarkable. IMPRESSION: Stable cardiomegaly with central pulmonary vascular congestion is noted, with bibasilar opacities concerning pulmonary edema. Small pleural effusions may be present. Electronically Signed   By: Lupita Raider M.D.   On: 2019/04/01 07:44   Dg Swallowing Func-speech Pathology  Result Date: 03/10/2019 Objective Swallowing Evaluation: Type of Study: MBS-Modified Barium Swallow Study  Patient Details Name: Stefanie [name] MRN: [id number] Date of Birth: 10-Jun-1933 Today's Date: 03/10/2019 Time: SLP Start Time (ACUTE ONLY): 1319 -SLP Stop Time  (ACUTE ONLY): 1340 SLP Time Calculation (min) (ACUTE ONLY): 21 min Past Medical History: Past Medical History: Diagnosis Date . Asthma  . Breast mass   Left . Chronic kidney disease   Borderline with serum creatinine at the upper limit of normal in 10/2011 . Chronic low back pain  . Diverticular disease   acute diverticulitis in 02/2010 . Dyspnea on exertion 2010 . Gastroesophageal reflux disease   hiatal hernia . Hyperlipidemia   Lipid profile 10/2011:240, 132, 52, 162 . Hypertension   Lab  10/2011: Normal CMet except GFR of 50, normal microalbumin, normal CBC, uric acid of 7.9, A1c of 6.1, normal urinalysis, low vitamin D; negative stress nuclear study in 2008 . Obesity  . Osteoporosis  . Paroxysmal atrial fibrillation (HCC)  . Sleep apnea 2010  Severe . Syncope 06/2011 . Urinary incontinence   Interstitial cystitis Past Surgical History: Past Surgical History: Procedure Laterality Date . CATARACT EXTRACTION   . CHOLECYSTECTOMY  1993 . CHOLECYSTECTOMY  1993 . COLONOSCOPY  2008  Negative screening study . DILATION AND CURETTAGE OF UTERUS  1986 . LUMBAR LAMINECTOMY  1992 . ORTHOPEDIC SURGERY   . TUBAL LIGATION   HPI: 85 y.o. female admitted for R CVA, presenting with suspected dysarthria and aphasia. On 8/19 she began having difficulty with understanding as  well as with finding words. Reported mild SOB. On 8/20 13:12, she was alert and oriented, able to give a clear and coherent history, able to name simple objects and repeat without difficulty, and did not have evidence of neglect. Left facial weakness and mild strengh weakness in left arm. Nursing reports facial droop has worsened. She has a history of hypertension, hyperlipidemia, atrial fibrillation not on anticoagulation. Barium esophagram 2015 with Diffuse age-related impairment of esophageal motility, Mild laryngeal penetration without aspiration, GERD. Repeat MBS today for possible upgrade in solids and/or liquids.  Subjective: requires cueing, daughter at  bedside Assessment / Plan / Recommendation CHL IP CLINICAL IMPRESSIONS 03/10/2019 Clinical Impression Pt's swallow function has improved mildy from prior study. Increased oral control and transit without significant left buccal pocketing with continued left facial leakage. Initiation of swallow was mostly timely. Thin barium penetrated to cords and silently aspirated during the swallow. Due to obstruction of shoulder could not view swallow with chin tuck. Suspect airway intrusion as pt produced reflexive throat clear x 2. Pt able to upgrade to nectar consistency and recommend continue Dys 2 with further management at SNF for texture upgrade. Pt being discharged today to SNF. Recommend return for repeat MBS in 4 weeks for potential improvement.    SLP Visit Diagnosis Dysphagia, oropharyngeal phase (R13.12) Attention and concentration deficit following -- Frontal lobe and executive function deficit following -- Impact on safety and function Moderate aspiration risk   CHL IP TREATMENT RECOMMENDATION 03/10/2019 Treatment Recommendations Therapy as outlined in treatment plan below   Prognosis 03/10/2019 Prognosis for Safe Diet Advancement Good Barriers to Reach Goals -- Barriers/Prognosis Comment -- CHL IP DIET RECOMMENDATION 03/10/2019 SLP Diet Recommendations Nectar thick liquid;Dysphagia 2 (Fine chop) solids Liquid Administration via Cup Medication Administration Crushed with puree Compensations Lingual sweep for clearance of pocketing;Other (Comment) Postural Changes Seated upright at 90 degrees   CHL IP OTHER RECOMMENDATIONS 03/10/2019 Recommended Consults -- Oral Care Recommendations Oral care BID Other Recommendations --   CHL IP FOLLOW UP RECOMMENDATIONS 03/10/2019 Follow up Recommendations Skilled Nursing facility   [facility] IP FREQUENCY AND DURATION 03/10/2019 Speech Therapy Frequency (ACUTE ONLY) min 2x/week Treatment Duration 2 weeks      CHL IP ORAL PHASE 03/10/2019 Oral Phase Impaired Oral - Pudding Teaspoon -- Oral -  Pudding Cup -- Oral - Honey Teaspoon -- Oral - Honey Cup -- Oral - Nectar Teaspoon -- Oral - Nectar Cup Lingual/palatal residue;Left anterior bolus loss Oral - Nectar Straw -- Oral - Thin Teaspoon -- Oral - Thin Cup WFL Oral - Thin Straw -- Oral - Puree -- Oral - Mech Soft -- Oral - Regular Delayed oral transit Oral - Multi-Consistency -- Oral - Pill -- Oral Phase - Comment --  CHL IP PHARYNGEAL PHASE 03/10/2019 Pharyngeal Phase Impaired Pharyngeal- Pudding Teaspoon -- Pharyngeal -- Pharyngeal- Pudding Cup -- Pharyngeal -- Pharyngeal- Honey Teaspoon -- Pharyngeal -- Pharyngeal- Honey Cup Delayed swallow initiation-vallecula Pharyngeal -- Pharyngeal- Nectar Teaspoon -- Pharyngeal -- Pharyngeal- Nectar Cup Penetration/Aspiration during swallow Pharyngeal Material enters airway, remains ABOVE vocal cords then ejected out Pharyngeal- Nectar Straw -- Pharyngeal -- Pharyngeal- Thin Teaspoon -- Pharyngeal -- Pharyngeal- Thin Cup Penetration/Aspiration during swallow Pharyngeal Material enters airway, passes BELOW cords without attempt by patient to eject out (silent aspiration) Pharyngeal- Thin Straw -- Pharyngeal -- Pharyngeal- Puree -- Pharyngeal -- Pharyngeal- Mechanical Soft -- Pharyngeal -- Pharyngeal- Regular WFL Pharyngeal -- Pharyngeal- Multi-consistency -- Pharyngeal -- Pharyngeal- Pill -- Pharyngeal -- Pharyngeal Comment --  CHL IP CERVICAL ESOPHAGEAL  PHASE 03/10/2019 Cervical Esophageal Phase WFL Pudding Teaspoon -- Pudding Cup -- Honey Teaspoon -- Honey Cup -- Nectar Teaspoon -- Nectar Cup -- Nectar Straw -- Thin Teaspoon -- Thin Cup -- Thin Straw -- Puree -- Mechanical Soft -- Regular -- Multi-consistency -- Pill -- Cervical Esophageal Comment -- Royce MacadamiaLitaker, Lisa Willis 03/10/2019, 5:31 PM    Breck CoonsLisa Willis Lonell FaceLitaker M.Ed Sports administratorCCC-SLP Speech-Language Pathologist Pager [phone] Office [phone]            Pending Labs Unresulted Labs (From admission, onward)    Start     Ordered   September 20, 2018 0709  Lactic acid, plasma  Now  then every 2 hours,   STAT     September 20, 2018 0709          Vitals/Pain Today's Vitals   September 20, 2018 0900 September 20, 2018 0930 September 20, 2018 1003 September 20, 2018 1030  BP: 101/64 123/85  (!) 94/32  Pulse:  96 87 91  Resp: (!) 21  16   SpO2:  97%  100%  Weight:      Height:        Isolation Precautions No active isolations  Medications Medications  food thickener (THICK IT) powder (has no administration in time range)  furosemide (LASIX) injection 60 mg (60 mg Intravenous Given 2019-04-21 1023)    Mobility non-ambulatory High fall risk   Focused Assessments    R Recommendations: See Admitting Provider Note  Report given to:   Additional Notes: nectar thick liquids  Left sided hemiplegia

## 2019-03-17 NOTE — ED Notes (Signed)
Dr. Shah notified of BP.

## 2019-03-17 NOTE — ED Provider Notes (Signed)
Emergency Department Provider Note   I have reviewed the triage vital signs and the nursing notes.   HISTORY  Chief Complaint Respiratory Distress   HPI Stefanie [name] is a 85 y.o. female with PMH listed below and recent admit for CVA presents to the emergency department for evaluation of difficulty breathing and hypoxemia.  She was found at the [facility] this AM by staff with labored breathing.  She was started on 6 L nasal cannula and transported by EMS to the emergency department.  Level 5 caveat applies as the patient is not able to provide significant history regarding the onset of symptoms or other factors in her case.  No report of fever.   I spoke with the patient's daughter by phone to make her aware that she is here in the emergency department and that she is very sick.  We discussed CODE STATUS.  Daughter reports that the patient would not want to be intubated under any circumstances. She would want a trial of CPR if necessary but "nothing prolonged."    Past Medical History:  Diagnosis Date  . Asthma   . Breast mass    Left  . Chronic kidney disease    Borderline with serum creatinine at the upper limit of normal in 10/2011  . Chronic low back pain   . Diverticular disease    acute diverticulitis in 02/2010  . Dyspnea on exertion 2010  . Gastroesophageal reflux disease    hiatal hernia  . Hyperlipidemia    Lipid profile 10/2011:240, 132, 52, 162  . Hypertension    Lab  10/2011: Normal CMet except GFR of 50, normal microalbumin, normal CBC, uric acid of 7.9, A1c of 6.1, normal urinalysis, low vitamin D; negative stress nuclear study in 2008  . Obesity   . Osteoporosis   . Paroxysmal atrial fibrillation (HCC)   . Sleep apnea 2010   Severe  . Syncope 06/2011  . Urinary incontinence    Interstitial cystitis    Patient Active Problem List   Diagnosis Date Noted  . Acute diastolic (congestive) heart failure (HCC) 2019-03-03  . Dysphagia due to recent cerebral  infarction 03/09/2019  . Renal insufficiency 03/09/2019  . Anemia 03/09/2019  . Acute on chronic kidney failure (HCC) 03/09/2019  . 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 Sleep apnea   . Diverticular disease   . Paroxysmal atrial fibrillation (HCC)   . Hypertension   . Hyperlipidemia   . Syncope 06/29/2011  . Morbid obesity (HCC) 06/29/2011    Past Surgical History:  Procedure Laterality Date  . CATARACT EXTRACTION    . CHOLECYSTECTOMY  1993  . CHOLECYSTECTOMY  1993  . COLONOSCOPY  2008   Negative screening study  . DILATION AND CURETTAGE OF UTERUS  1986  . LUMBAR LAMINECTOMY  1992  . ORTHOPEDIC SURGERY    . TUBAL LIGATION      Allergies Ace inhibitors, Ciprofloxacin, Hydrocodone-acetaminophen, Lipitor [atorvastatin], Monopril [fosinopril sodium], Morphine and related, Nitrofurantoin monohyd macro, Percocet [oxycodone-acetaminophen], Statins, Flagyl [metronidazole], and Phenazopyridine  Family History  Problem Relation Age of Onset  . Heart attack Mother   . Asthma Father   . Diabetes Father   . Heart failure Father   . Colitis Sister   . Heart failure Brother   . Pancreatic cancer Brother   . Diabetes Sister   . Transient ischemic attack Sister     Social History Social History   Tobacco Use  .  Smoking status: Never Smoker  . Smokeless tobacco: Never Used  Substance Use Topics  . Alcohol use: No    Alcohol/week: 0.0 standard drinks  . Drug use: No    Review of Systems  Level 5 caveat: Respiratory Distress.   ____________________________________________   PHYSICAL EXAM:  VITAL SIGNS: Vitals:   05-19-2019 1200 05-19-2019 1230  BP: (!) 70/28 (!) 59/25  Pulse:    Resp: 19 (!) 25  SpO2:      Constitutional: Somewhat somnolent but arouses to voice. Appears to have increased WOB.  Eyes: Conjunctivae are normal.  Head: Atraumatic. Nose: No congestion/rhinnorhea. Mouth/Throat: Mucous membranes are moist.  Neck: No  stridor.  Cardiovascular: Normal rate, regular rhythm. Good peripheral circulation. Grossly normal heart sounds.   Respiratory: Increased respiratory effort.  Lungs without wheezing or rales.  Gastrointestinal: Soft and nontender. No distention.  Musculoskeletal:  No gross deformities of extremities. Neurologic: Moving all extremities equally. Exam limited with patient's respiratory status.  Skin:  Skin is warm, dry and intact. No rash noted.  ____________________________________________   LABS (all labs ordered are listed, but only abnormal results are displayed)  Labs Reviewed  BASIC METABOLIC PANEL - Abnormal; Notable for the following components:      Result Value   Sodium 152 (*)    Chloride 122 (*)    Glucose, Bld 128 (*)    BUN 44 (*)    Creatinine, Ser 1.36 (*)    GFR calc non Af Amer 36 (*)    GFR calc Af Amer 41 (*)    All other components within normal limits  BRAIN NATRIURETIC PEPTIDE - Abnormal; Notable for the following components:   B Natriuretic Peptide 510.0 (*)    All other components within normal limits  CBC WITH DIFFERENTIAL/PLATELET - Abnormal; Notable for the following components:   RBC 3.64 (*)    Hemoglobin 10.6 (*)    MCV 101.1 (*)    MCHC 28.8 (*)    RDW 17.1 (*)    All other components within normal limits  BLOOD GAS, ARTERIAL - Abnormal; Notable for the following components:   pH, Arterial 7.284 (*)    pO2, Arterial 146 (*)    Acid-base deficit 4.0 (*)    Allens test (pass/fail) BRACHIAL ARTERY (*)    All other components within normal limits  TROPONIN I (HIGH SENSITIVITY) - Abnormal; Notable for the following components:   Troponin I (High Sensitivity) 613 (*)    All other components within normal limits  CULTURE, BLOOD (ROUTINE X 2)  CULTURE, BLOOD (ROUTINE X 2)  SARS CORONAVIRUS 2 (HOSPITAL ORDER, PERFORMED IN [facility])  LACTIC ACID, PLASMA   ____________________________________________  EKG   EKG Interpretation   Date/Time:  Thursday March 12 2019 07:13:23 EDT Ventricular Rate:  99 PR Interval:    QRS Duration: 94 QT Interval:  355 QTC Calculation: 456 R Axis:   -29 Text Interpretation:  Sinus rhythm Borderline left axis deviation Anterior infarct, old Similar to prior  No STEMI  Confirmed by Alona BeneLong, Heron Pitcock [phone]([id number]) on 12/30/2018 7:23:11 AM       ____________________________________________  RADIOLOGY  Dg Pelvis Portable  Result Date: 12/30/2018 CLINICAL DATA:  Right hip pain.  No known injury. EXAM: PORTABLE PELVIS 1-2 VIEWS COMPARISON:  None. FINDINGS: There is no evidence of pelvic fracture or diastasis. No pelvic bone lesions are seen. IMPRESSION: Negative. Electronically Signed   By: Lupita RaiderJames  Green Jr M.D.   On: 11-03-202020 09:50   [facility]  Result Date: Mar 23, 2019 CLINICAL DATA:  Shortness of breath. EXAM: PORTABLE CHEST 1 VIEW COMPARISON:  Radiograph of March 09, 2019. FINDINGS: Stable cardiomegaly. Central pulmonary vascular congestion is noted. Bibasilar opacities are noted concerning for edema with small pleural effusions. No pneumothorax is noted. Bony thorax is unremarkable. IMPRESSION: Stable cardiomegaly with central pulmonary vascular congestion is noted, with bibasilar opacities concerning pulmonary edema. Small pleural effusions may be present. Electronically Signed   By: Marijo Conception M.D.   On: 03/23/19 07:44    ____________________________________________   PROCEDURES  Procedure(s) performed:   Procedures  CRITICAL CARE Performed by: Margette Fast Total critical care time: 75 minutes Critical care time was exclusive of separately billable procedures and treating other patients. Critical care was necessary to treat or prevent imminent or life-threatening deterioration. Critical care was time spent personally by me on the following activities: development of treatment plan with patient and/or surrogate as well as nursing, discussions with consultants,  evaluation of patient's response to treatment, examination of patient, obtaining history from patient or surrogate, ordering and performing treatments and interventions, ordering and review of laboratory studies, ordering and review of radiographic studies, pulse oximetry and re-evaluation of patient's condition.  Nanda Quinton, MD Emergency Medicine  ____________________________________________   INITIAL IMPRESSION / ASSESSMENT AND PLAN / ED COURSE  Pertinent labs & imaging results that were available during my care of the patient were reviewed by me and considered in my medical decision making (see chart for details).   Patient presents to the emergency department for evaluation of hypoxemia with increased work of breathing.  Lungs are relatively clear.  Differential is broad at this time and includes PE, COVID-19, HCAP. No clear history of aspiration type event.  Had goals of care discussion with the daughter immediately.  Patient does not want to be intubated under any circumstance.  Will provide maximum supportive care up until that point.   09:00 AM  COVID-19 test is negative.  Chest x-ray with pulmonary edema type pattern with possible pleural effusions.  Reviewed the echo from earlier this month showing diastolic dysfunction.  Will get Lasix and start the patient on BiPAP given her continued increased work of breathing although clinically has improved significantly from arrival. Daughter now at bedside.   Discussed patient's case with [facility] to request admission. Patient and family (if present) updated with plan. Care transferred to [facility] service.  I reviewed all nursing notes, vitals, pertinent old records, EKGs, labs, imaging (as available).   2:10 PM Patient boarding in the ED. Has since transitioned to comfort care per discussion with the Hospitalist and daughter at bedside. Patient with asystole and no longer breathing. Called to bedside to confirm passing. No heart sounds or breathing  on exam. Updated daughter. TOD: 14:05.  ____________________________________________  FINAL CLINICAL IMPRESSION(S) / ED DIAGNOSES  Final diagnoses:  Acute respiratory failure with hypoxia (HCC)     MEDICATIONS GIVEN DURING THIS VISIT:  Medications  pantoprazole (PROTONIX) EC tablet 40 mg (has no administration in time range)  Resource ThickenUp Clear 120 g (has no administration in time range)  gabapentin (NEURONTIN) capsule 100 mg (has no administration in time range)  sodium chloride flush (NS) 0.9 % injection 3 mL (has no administration in time range)  sodium chloride flush (NS) 0.9 % injection 3 mL (has no administration in time range)  0.9 %  sodium chloride infusion (has no administration in time range)  acetaminophen (TYLENOL) tablet 650 mg (has no administration in time  range)    Or  acetaminophen (TYLENOL) suppository 650 mg (has no administration in time range)  ondansetron (ZOFRAN) tablet 4 mg (has no administration in time range)    Or  ondansetron (ZOFRAN) injection 4 mg (has no administration in time range)  LORazepam (ATIVAN) injection 1 mg (has no administration in time range)  furosemide (LASIX) injection 60 mg (60 mg Intravenous Given 2019-03-24 1023)  morphine 2 MG/ML injection 2 mg (2 mg Intravenous Given Mar 24, 2019 1211)    Note:  This document was prepared using Dragon voice recognition software and may include unintentional dictation errors.  Alona Bene, MD Emergency Medicine    Tico Crotteau, Arlyss Repress, MD 03-24-19 Rosamaria Lints

## 2019-03-17 NOTE — ED Notes (Signed)
Palliative nurse at bedside with family

## 2019-03-17 NOTE — Consult Note (Signed)
Consultation Note Date: 04-11-19   Patient Name: Stefanie [name]  DOB: September 22, 1932  MRN: [id number]  Age / Sex: 85 y.o., female  PCP: Benita Stabile, MD Referring Physician: Erick Blinks, DO  Reason for Consultation: Terminal Care  HPI/Patient Profile: 85 y.o. female  with past medical history of recent MCA embolic CVA from [facility] diastolic CHF, paroxysmal a fib, CKD III, dysphagia admitted on 04-11-2019 with hypoxia, with oxygen saturation in the 70's initially requiring bipap. She was hypotensive.  Chestxray noted pulmonary congestion with question bilateral effusions. She also had complaints of diffuse hip and back pain. Attending MD discussed GOC with patient's daughter and decision was made for DNR/DNI and comfort measures only. Palliative medicine consulted for assistance with EOL care.   Clinical Assessment and Goals of Care: I received report from Dr. Sherryll Burger, evaluated patient at bedside and discussed patient's care with patient's daughter.  Patient was in bed, moaning, stating "I hurt all over". She had previously received 2mg  Morphine. She was on 6L O2 with oxygen saturation at 50-60's.  Discussed with patient's daughter GOC. She stated that her mother was "ready to die" and she only wanted to die peacefully without pain. Discussed further comfort measures including morphine, lorazepam. Discussed once patient was comfortable would decrease oxygen. Prepared daughter that patient would likely die quickly once oxygen was discontinued. Daughter stated this was goal, as long as died without suffering.   I remained at bedside as patient was given additional 2mg  morphine and 2mg  lorazepam. She experienced immediate relief with additional medications. Oxygen was slowly titrated down and dc'd with no signs of distress. Patient died peacefully very soon after.    Primary Decision Maker NEXT OF KIN- patient's  daughter      Discharge Planning: Anticipated Hospital Death  Primary Diagnoses: Present on Admission: . Acute diastolic (congestive) heart failure (HCC)   I have reviewed the medical record, interviewed the patient and family, and examined the patient. The following aspects are pertinent.  Past Medical History:  Diagnosis Date  . Asthma   . Breast mass    Left  . Chronic kidney disease    Borderline with serum creatinine at the upper limit of normal in 10/2011  . Chronic low back pain   . Diverticular disease    acute diverticulitis in 02/2010  . Dyspnea on exertion 2010  . Gastroesophageal reflux disease    hiatal hernia  . Hyperlipidemia    Lipid profile 10/2011:240, 132, 52, 162  . Hypertension    Lab  10/2011: Normal CMet except GFR of 50, normal microalbumin, normal CBC, uric acid of 7.9, A1c of 6.1, normal urinalysis, low vitamin D; negative stress nuclear study in 2008  . Obesity   . Osteoporosis   . Paroxysmal atrial fibrillation (HCC)   . Sleep apnea 2010   Severe  . Syncope 06/2011  . Urinary incontinence    Interstitial cystitis   Social History   Socioeconomic History  . Marital status: Married    Spouse name: Not  on file  . Number of children: Not on file  . Years of education: Not on file  . Highest education level: Not on file  Occupational History  . Not on file  Social Needs  . Financial resource strain: Not on file  . Food insecurity    Worry: Not on file    Inability: Not on file  . Transportation needs    Medical: Not on file    Non-medical: Not on file  Tobacco Use  . Smoking status: Never Smoker  . Smokeless tobacco: Never Used  Substance and Sexual Activity  . Alcohol use: No    Alcohol/week: 0.0 standard drinks  . Drug use: No  . Sexual activity: Not on file  Lifestyle  . Physical activity    Days per week: Not on file    Minutes per session: Not on file  . Stress: Not on file  Relationships  . Social Herbalist on  phone: Not on file    Gets together: Not on file    Attends religious service: Not on file    Active member of club or organization: Not on file    Attends meetings of clubs or organizations: Not on file    Relationship status: Not on file  Other Topics Concern  . Not on file  Social History Narrative   Married   Exercises regularly   Family History  Problem Relation Age of Onset  . Heart attack Mother   . Asthma Father   . Diabetes Father   . Heart failure Father   . Colitis Sister   . Heart failure Brother   . Pancreatic cancer Brother   . Diabetes Sister   . Transient ischemic attack Sister    Scheduled Meds: . gabapentin  100 mg Oral QHS  . sodium chloride flush  3 mL Intravenous Q12H   Continuous Infusions: . sodium chloride     PRN Meds:.sodium chloride, acetaminophen **OR** acetaminophen, LORazepam, ondansetron **OR** ondansetron (ZOFRAN) IV, sodium chloride flush Medications Prior to Admission:  Prior to Admission medications   Medication Sig Start Date End Date Taking? Authorizing Provider  allopurinol (ZYLOPRIM) 100 MG tablet Take 100 mg by mouth 2 (two) times daily.   Yes [provider]  apixaban (ELIQUIS) 5 MG TABS tablet Take 1 tablet (5 mg total) by mouth 2 (two) times daily. 03/10/19  Yes Donzetta Starch, NP  Balsam [place]-Castor Oil (VENELEX) OINT Apply to bilateral buttocks and sacrum qshift & prn for prevention. Every shift   Yes [provider]  bumetanide (BUMEX) 1 MG tablet Take 1 mg by mouth daily.     Yes [provider]  calcium carbonate (TUMS - DOSED IN MG ELEMENTAL CALCIUM) 500 MG chewable tablet Chew 1 tablet by mouth 2 (two) times daily as needed for indigestion or heartburn.    Yes [provider]  cholecalciferol (VITAMIN D) 25 MCG tablet Take 1 tablet (1,000 Units total) by mouth daily. 03/11/19  Yes Donzetta Starch, NP  colchicine 0.6 MG tablet Take 1 tablet (0.6 mg total) by mouth 2 (two) times daily. 03/10/19   Yes Donzetta Starch, NP  colesevelam (WELCHOL) 625 MG tablet Take 1,875 mg by mouth 2 (two) times daily with a meal.    Yes [provider]  gabapentin (NEURONTIN) 100 MG capsule Take 1 capsule (100 mg total) by mouth at bedtime. 03/10/19  Yes Donzetta Starch, NP  Maltodextrin-Xanthan Gum ([place]) POWD Take  120 g by mouth as needed (for nectar thick liquids). 03/10/19  Yes Layne BentonBiby, Sharon L, NP  metoprolol tartrate (LOPRESSOR) 50 MG tablet Take 1 tablet (50 mg total) by mouth 2 (two) times daily. 03/10/19  Yes Layne BentonBiby, Sharon L, NP  NON FORMULARY Diet - Dysphagia 2 with Nectar thick liquids   Yes [provider]  NON FORMULARY Cpap during sleep - may bring from home and use home settings Twice A Day   Yes [provider]  ondansetron (ZOFRAN) 4 MG tablet Take 4 mg by mouth every 6 (six) hours as needed for nausea or vomiting.   Yes [provider]  Ostomy Supplies (SKIN PREP WIPES) MISC Apply skin prep to bilateral heels qshift for prevention. Every Shift   Yes [provider]  pantoprazole (PROTONIX) 40 MG tablet Take 40 mg by mouth daily.   Yes [provider]  polyethylene glycol (MIRALAX / GLYCOLAX) packet Take 17 g by mouth daily as needed for mild constipation.    Yes [provider]   Allergies  Allergen Reactions  . Ace Inhibitors Cough    Occurred with Monopril  . Ciprofloxacin     Sick  . Hydrocodone-Acetaminophen Nausea Only  . Lipitor [Atorvastatin]     Tired, weak muscles  . Monopril [Fosinopril Sodium]     Cough   . Morphine And Related   . Nitrofurantoin Monohyd Macro Nausea Only    Also skin rash, edema   . Percocet [Oxycodone-Acetaminophen] Nausea Only  . Statins Other (See Comments)    Myalgias; prior exposure to pravastatin, atorvastatin, lovastatin, Lescol, simvastatin and ezetimibe  . Flagyl [Metronidazole] Nausea Only  . Phenazopyridine Rash   Review of Systems  Unable to perform ROS:  Acuity of condition    Physical Exam Vitals signs and nursing note reviewed.  Constitutional:      General: She is in acute distress.     Appearance: She is obese.  Cardiovascular:     Rate and Rhythm: Tachycardia present.  Pulmonary:     Comments: SOB Skin:    Findings: Bruising present.     Comments: L arm and L hip bruised  Neurological:     Mental Status: She is disoriented.     Vital Signs: BP (!) 61/29   Pulse 84   Resp 18   Ht 5\' 7"  (1.702 m)   Wt 86.4 kg   SpO2 96%   BMI 29.83 kg/m  Pain Scale: Faces       SpO2: SpO2: 96 % O2 Device:SpO2: 96 % O2 Flow Rate: .O2 Flow Rate (L/min): 6 L/min  IO: Intake/output summary: No intake or output data in the 24 hours ending 02/03/2019 1406  LBM:   Baseline Weight: Weight: 86.4 kg Most recent weight: Weight: 86.4 kg     Palliative Assessment/Data: PPS: 10%     Thank you for this consult. Palliative medicine will continue to follow and assist as needed.   Time In: 1250 Time Out: 1410 Time Total: 80 mins Greater than 50%  of this time was spent counseling and coordinating care related to the above assessment and plan.  Signed by: Ocie BobKasie Deborha Moseley, AGNP-C Palliative Medicine    Please contact Palliative Medicine Team phone at [phone] for questions and concerns.  For individual provider: See Loretha StaplerAmion

## 2019-03-17 NOTE — ED Notes (Signed)
Time of death 1  EDP in to call death. Daughter at bedside

## 2019-03-17 NOTE — Progress Notes (Signed)
Present with Stefanie [name] and her daughter Marcie Bal for emotional and spiritual support. Supported her faith and prayed with them. Yamina asked if her grandson Angelica Chessman could also be there. He was called and will be coming

## 2019-03-17 DEATH — deceased
